# Patient Record
Sex: Female | Born: 1935
Health system: Southern US, Community
[De-identification: ages and names within clinical notes are randomized; demographics above are authoritative.]

## PROBLEM LIST (undated history)

## (undated) DIAGNOSIS — IMO0001 Reserved for inherently not codable concepts without codable children: Secondary | ICD-10-CM

## (undated) DIAGNOSIS — H3530 Unspecified macular degeneration: Secondary | ICD-10-CM

## (undated) DIAGNOSIS — E559 Vitamin D deficiency, unspecified: Principal | ICD-10-CM

## (undated) DIAGNOSIS — IMO0002 Reserved for concepts with insufficient information to code with codable children: Secondary | ICD-10-CM

## (undated) DIAGNOSIS — R32 Unspecified urinary incontinence: Secondary | ICD-10-CM

## (undated) DIAGNOSIS — I4891 Unspecified atrial fibrillation: Secondary | ICD-10-CM

## (undated) HISTORY — DX: Unspecified atrial fibrillation: I48.91

## (undated) HISTORY — DX: Vitamin D deficiency, unspecified: E55.9

## (undated) HISTORY — DX: Reserved for inherently not codable concepts without codable children: IMO0001

## (undated) HISTORY — PX: ARM SKIN LESION BIOPSY / EXCISION: SUR471

## (undated) HISTORY — DX: Reserved for concepts with insufficient information to code with codable children: IMO0002

## (undated) HISTORY — PX: OTHER SURGICAL HISTORY: SHX169

## (undated) HISTORY — DX: Unspecified urinary incontinence: R32

## (undated) HISTORY — DX: Unspecified macular degeneration: H35.30

## (undated) HISTORY — PX: LAMINECTOMY: SHX219

---
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
## 2005-06-10 HISTORY — PX: ABDOMINAL HYSTERECTOMY: SHX81

## 2005-06-10 LAB — CONVERTED CEMR LAB: Pap Smear: NORMAL

## 2009-01-27 DIAGNOSIS — IMO0001 Reserved for inherently not codable concepts without codable children: Secondary | ICD-10-CM

## 2009-01-27 HISTORY — DX: Reserved for inherently not codable concepts without codable children: IMO0001

## 2009-10-10 ENCOUNTER — Ambulatory Visit: Payer: Self-pay | Admitting: Family

## 2009-10-10 DIAGNOSIS — I4891 Unspecified atrial fibrillation: Secondary | ICD-10-CM

## 2009-10-10 DIAGNOSIS — I10 Essential (primary) hypertension: Secondary | ICD-10-CM

## 2009-10-10 DIAGNOSIS — Z8679 Personal history of other diseases of the circulatory system: Secondary | ICD-10-CM | POA: Insufficient documentation

## 2009-10-10 DIAGNOSIS — R32 Unspecified urinary incontinence: Secondary | ICD-10-CM | POA: Insufficient documentation

## 2009-10-10 LAB — CONVERTED CEMR LAB
ALT: <8 units/L (ref 0–35)
AST: 18 units/L (ref 0–37)
BUN: 15 mg/dL (ref 6–23)
Calcium: 10.3 mg/dL (ref 8.4–10.5)
Prothrombin Time: 21.3 s — ABNORMAL HIGH (ref 11.6–15.2)
Sodium: 142 meq/L (ref 135–145)

## 2009-10-11 ENCOUNTER — Telehealth: Payer: Self-pay | Admitting: Family

## 2009-10-11 ENCOUNTER — Encounter (INDEPENDENT_AMBULATORY_CARE_PROVIDER_SITE_OTHER): Payer: Self-pay | Admitting: *Deleted

## 2009-10-13 ENCOUNTER — Ambulatory Visit: Payer: Self-pay | Admitting: Internal Medicine

## 2009-10-13 LAB — CONVERTED CEMR LAB

## 2009-10-24 ENCOUNTER — Encounter: Payer: Self-pay | Admitting: Internal Medicine

## 2009-10-24 ENCOUNTER — Ambulatory Visit: Payer: Self-pay | Admitting: Internal Medicine

## 2009-10-25 ENCOUNTER — Ambulatory Visit: Payer: Self-pay | Admitting: Gastroenterology

## 2009-10-25 ENCOUNTER — Encounter: Payer: Self-pay | Admitting: Internal Medicine

## 2009-10-25 ENCOUNTER — Encounter (INDEPENDENT_AMBULATORY_CARE_PROVIDER_SITE_OTHER): Payer: Self-pay | Admitting: *Deleted

## 2009-10-25 ENCOUNTER — Ambulatory Visit: Payer: Self-pay | Admitting: Internal Medicine

## 2009-11-01 ENCOUNTER — Encounter: Payer: Self-pay | Admitting: Cardiology

## 2009-11-01 ENCOUNTER — Ambulatory Visit: Payer: Self-pay | Admitting: Cardiology

## 2009-11-07 ENCOUNTER — Telehealth: Payer: Self-pay | Admitting: Cardiology

## 2009-11-13 ENCOUNTER — Ambulatory Visit: Payer: Self-pay | Admitting: Family

## 2009-11-13 DIAGNOSIS — M810 Age-related osteoporosis without current pathological fracture: Secondary | ICD-10-CM

## 2009-11-13 LAB — CONVERTED CEMR LAB: Sodium, Ur: 45 meq/L

## 2009-11-14 ENCOUNTER — Ambulatory Visit: Payer: Self-pay | Admitting: Cardiology

## 2009-11-14 ENCOUNTER — Encounter: Payer: Self-pay | Admitting: Family

## 2009-11-14 LAB — CONVERTED CEMR LAB: POC INR: 2.2

## 2009-11-16 ENCOUNTER — Ambulatory Visit: Payer: Self-pay | Admitting: Diagnostic Radiology

## 2009-11-16 ENCOUNTER — Encounter: Payer: Self-pay | Admitting: Family

## 2009-11-16 ENCOUNTER — Ambulatory Visit (HOSPITAL_BASED_OUTPATIENT_CLINIC_OR_DEPARTMENT_OTHER): Admission: RE | Admit: 2009-11-16 | Discharge: 2009-11-16 | Payer: Self-pay | Admitting: Internal Medicine

## 2009-11-21 ENCOUNTER — Ambulatory Visit: Payer: Self-pay | Admitting: Family

## 2009-11-21 ENCOUNTER — Ambulatory Visit (HOSPITAL_BASED_OUTPATIENT_CLINIC_OR_DEPARTMENT_OTHER)
Admission: RE | Admit: 2009-11-21 | Discharge: 2009-11-21 | Payer: Self-pay | Source: Home / Self Care | Admitting: Internal Medicine

## 2009-11-21 ENCOUNTER — Ambulatory Visit: Payer: Self-pay | Admitting: Radiology

## 2009-11-22 ENCOUNTER — Telehealth: Payer: Self-pay | Admitting: Cardiology

## 2009-11-22 ENCOUNTER — Telehealth: Payer: Self-pay | Admitting: Family

## 2009-12-04 ENCOUNTER — Ambulatory Visit: Payer: Self-pay | Admitting: Gastroenterology

## 2009-12-08 ENCOUNTER — Encounter: Payer: Self-pay | Admitting: Gastroenterology

## 2009-12-12 ENCOUNTER — Ambulatory Visit: Payer: Self-pay | Admitting: Internal Medicine

## 2009-12-12 LAB — CONVERTED CEMR LAB: POC INR: 1.5

## 2009-12-13 ENCOUNTER — Telehealth: Payer: Self-pay | Admitting: Family

## 2009-12-19 ENCOUNTER — Ambulatory Visit: Payer: Self-pay | Admitting: Cardiovascular Disease

## 2009-12-19 LAB — CONVERTED CEMR LAB: POC INR: 2.6

## 2009-12-21 ENCOUNTER — Encounter: Payer: Self-pay | Admitting: Family

## 2010-01-16 ENCOUNTER — Ambulatory Visit: Payer: Self-pay | Admitting: Cardiology

## 2010-01-16 LAB — CONVERTED CEMR LAB: POC INR: 2.2

## 2010-01-31 ENCOUNTER — Encounter: Payer: Self-pay | Admitting: Cardiology

## 2010-01-31 ENCOUNTER — Ambulatory Visit: Payer: Self-pay | Admitting: Cardiology

## 2010-01-31 ENCOUNTER — Telehealth: Payer: Self-pay | Admitting: Cardiology

## 2010-01-31 LAB — CONVERTED CEMR LAB
Hemoglobin: 13.3 g/dL (ref 12.0–15.0)
MCHC: 32.4 g/dL (ref 30.0–36.0)
MCV: 94.3 fL (ref 78.0–100.0)
Platelets: 189 10*3/uL (ref 150–400)
RBC: 4.36 M/uL (ref 3.87–5.11)
RDW: 14.6 % (ref 11.5–15.5)

## 2010-02-13 ENCOUNTER — Ambulatory Visit: Payer: Self-pay | Admitting: Cardiovascular Disease

## 2010-02-19 ENCOUNTER — Ambulatory Visit: Payer: Self-pay | Admitting: Family

## 2010-02-19 DIAGNOSIS — D126 Benign neoplasm of colon, unspecified: Secondary | ICD-10-CM | POA: Insufficient documentation

## 2010-03-02 ENCOUNTER — Telehealth: Payer: Self-pay | Admitting: Family

## 2010-03-05 ENCOUNTER — Telehealth (INDEPENDENT_AMBULATORY_CARE_PROVIDER_SITE_OTHER): Payer: Self-pay | Admitting: *Deleted

## 2010-03-12 ENCOUNTER — Telehealth: Payer: Self-pay | Admitting: Cardiology

## 2010-03-13 ENCOUNTER — Ambulatory Visit: Payer: Self-pay | Admitting: Cardiology

## 2010-03-13 LAB — CONVERTED CEMR LAB: POC INR: 2

## 2010-04-10 ENCOUNTER — Ambulatory Visit: Payer: Self-pay | Admitting: Cardiovascular Disease

## 2010-04-11 ENCOUNTER — Telehealth: Payer: Self-pay | Admitting: Cardiology

## 2010-04-25 ENCOUNTER — Encounter: Payer: Self-pay | Admitting: Gastroenterology

## 2010-05-01 ENCOUNTER — Encounter: Payer: Self-pay | Admitting: Gastroenterology

## 2010-05-08 ENCOUNTER — Ambulatory Visit: Payer: Self-pay | Admitting: Cardiology

## 2010-05-09 ENCOUNTER — Telehealth: Payer: Self-pay | Admitting: Family

## 2010-05-15 ENCOUNTER — Encounter (INDEPENDENT_AMBULATORY_CARE_PROVIDER_SITE_OTHER): Payer: Self-pay | Admitting: *Deleted

## 2010-05-15 ENCOUNTER — Ambulatory Visit: Payer: Self-pay | Admitting: Gastroenterology

## 2010-05-30 ENCOUNTER — Ambulatory Visit: Payer: Self-pay | Admitting: Gastroenterology

## 2010-06-01 ENCOUNTER — Encounter: Payer: Self-pay | Admitting: Gastroenterology

## 2010-06-05 ENCOUNTER — Ambulatory Visit: Payer: Self-pay

## 2010-06-26 ENCOUNTER — Encounter: Payer: Self-pay | Admitting: Cardiology

## 2010-06-26 ENCOUNTER — Ambulatory Visit
Admission: RE | Admit: 2010-06-26 | Discharge: 2010-06-26 | Payer: Self-pay | Source: Home / Self Care | Attending: Family | Admitting: Family

## 2010-06-26 ENCOUNTER — Encounter: Payer: Self-pay | Admitting: Family

## 2010-06-26 DIAGNOSIS — R1031 Right lower quadrant pain: Secondary | ICD-10-CM | POA: Insufficient documentation

## 2010-06-26 LAB — CONVERTED CEMR LAB: Prothrombin Time: 25 s

## 2010-06-27 ENCOUNTER — Encounter: Payer: Self-pay | Admitting: Cardiology

## 2010-06-27 LAB — CONVERTED CEMR LAB
Basophils Absolute: 0 10*3/uL (ref 0.0–0.1)
Calcium: 9.8 mg/dL (ref 8.4–10.5)
Chloride: 105 meq/L (ref 96–112)
Creatinine, Ser: 0.63 mg/dL (ref 0.40–1.20)
HCT: 41.9 % (ref 36.0–46.0)
Lymphs Abs: 2.5 10*3/uL (ref 0.7–4.0)
MCHC: 32.9 g/dL (ref 30.0–36.0)
Monocytes Relative: 12 % (ref 3–12)
Neutro Abs: 3 10*3/uL (ref 1.7–7.7)
Platelets: 208 10*3/uL (ref 150–400)
Prothrombin Time: 25 s — ABNORMAL HIGH (ref 11.6–15.2)

## 2010-06-28 ENCOUNTER — Ambulatory Visit (HOSPITAL_BASED_OUTPATIENT_CLINIC_OR_DEPARTMENT_OTHER)
Admission: RE | Admit: 2010-06-28 | Discharge: 2010-06-28 | Payer: Self-pay | Source: Home / Self Care | Attending: Internal Medicine | Admitting: Internal Medicine

## 2010-06-29 ENCOUNTER — Telehealth: Payer: Self-pay | Admitting: Family

## 2010-07-10 NOTE — Assessment & Plan Note (Signed)
Summary: HEAD COLD/DT--Rm 5   Vital Signs:  Patient profile:   77 year old female Height:      66.5 inches Weight:      172 pounds BMI:     27.44 Temp:     97.6 degrees F oral Pulse rate:   66 / minute Pulse rhythm:   irregular Resp:     16 per minute BP sitting:   120 / 82  (right arm) Cuff size:   regular  Vitals Entered By: Mervin Kung CMA (November 21, 2009 11:14 AM) CC: Room 5     Head congestion and just started coughing. Is Patient Diabetic? No Comments Pt  is taking an herbal bone supplement instead of Calcium. Also taking Equate daytime cold 1 once daily and claritin 1 once daily    Primary Care Provider:  Sandford Craze, NP  CC:  Room 5     Head congestion and just started coughing.Marland Kitchen  History of Present Illness: Carla Curtis is a 77 year old female who presnets today with ocmplaint of sinus congestion.  She notes that she is having difficulty clearing nasal congestion, but what she has been able to blow out is light yellow in color.  Notes + HA over and behind her eyes.  Notes + dry cough which is mild.  Denies fever.  + malaise, poor energy.  Has tried dayquil with minimal improvement.    Allergies (verified): No Known Drug Allergies  Physical Exam  General:  Well-developed,well-nourished, tired appearing elderly white female, appropriate and cooperative throughout examination Head:  mild maxillary sinus tenderness to palpation Eyes:  No corneal or conjunctival inflammation noted. EOMI. Perrla. Funduscopic exam benign, without hemorrhages, exudates or papilledema. Vision grossly normal. Ears:  External ear exam shows no significant lesions or deformities.  Otoscopic examination reveals clear canals, tympanic membranes are intact bilaterally without bulging, retraction, inflammation or discharge. Hearing is grossly normal bilaterally. Mouth:  Oral mucosa and oropharynx without lesions or exudates.  Teeth in good repair. Lungs:  Normal respiratory effort, chest  expands symmetrically. Lungs are clear to auscultation, no crackles or wheezes. Heart:  Normal rate and regular rhythm. S1 and S2 normal without gallop, murmur, click, rub or other extra sounds.   Impression & Recommendations:  Problem # 1:  SINUSITIS (ICD-473.9) Assessment New  CXR was performed today and is negative for PNA, will plan to treat with amoxicillin 500mg  by mouth three times a day x 10 days.  Patient instructed on red flags that should prompt return per instructions. Message sent to Weston Brass PharmD with coumadin clinic notifing her that patient was started on antibiotics and that she will need earlier follow up. Her updated medication list for this problem includes:    Amoxicillin 500 Mg Cap (Amoxicillin) .Marland Kitchen... Take 1 capsule by mouth three times a day x 10 days  Orders: Prescription Created Electronically [phone])  Complete Medication List: 1)  Oxybutynin Chloride 10 Mg Xr24h-tab (Oxybutynin chloride) .... Take 1 tablet by mouth once a day as needed. 2)  Lasix 40 Mg Tabs (Furosemide) .... 1/2 tablet daily 3)  Dilt-xr 120 Mg Xr24h-cap (Diltiazem hcl) .... Take 1 capsule by mouth once a day 4)  Warfarin Sodium 5 Mg Tabs (Warfarin sodium) .... Take 1 tablet one day and alternate 1/2 tablet the next. 5)  Caltrate 600+d 600-400 Mg-unit Tabs (Calcium carbonate-vitamin d) .... One tab by mouth twice daily 6)  Fosamax 70 Mg Tabs (Alendronate sodium) .... One tablet by mouth once weekly.  take  with water 30 minutes before first food/med.  sit upright for 30 minutes after dose 7)  Amoxicillin 500 Mg Cap (Amoxicillin) .... Take 1 capsule by mouth three times a day x 10 days  Patient Instructions: 1)  Please complete your chest x-ray downstairs. 2)  Call if symptoms worsen or do not improve. 3)  We will call you wth the results of your x-ray. 4)  Please follow up in the coumadin Clinic this Friday. Prescriptions: AMOXICILLIN 500 MG CAP (AMOXICILLIN) Take 1 capsule by mouth three  times a day X 10 days  #30 x 0   Entered and Authorized by:   Lemont Fillers FNP   Signed by:   Lemont Fillers FNP on 11/21/2009   Method used:   Telephoned to ...       [facility]  [address] # [phone]* (retail)       [phone]       [place], [place]  [address]       Ph: [phone]       Fax: [phone]   RxID:   [id number]   Current Allergies (reviewed today): No known allergies

## 2010-07-10 NOTE — Miscellaneous (Signed)
Summary: BONE DENSITY  Clinical Lists Changes  Orders: Added new Test order of T-Bone Densitometry (77080) - Signed Added new Test order of T-Lumbar Vertebral Assessment (77082) - Signed 

## 2010-07-10 NOTE — Letter (Signed)
Summary: [facility] Instructions  [facility]  [address] [place], [place] [address]   Phone: [phone]  Fax: [phone]       CORONA Curtis    75-10-1932    MRN: [id number]        Procedure Day /Date:05/30/10  WED     Arrival Time:8 am     Procedure Time:9 am     Location of Procedure:                    X  [facility] (4th Floor)                        PREPARATION FOR COLONOSCOPY WITH MOVIPREP   Starting 5 days prior to your procedure 05/25/10 do not eat nuts, seeds, popcorn, corn, beans, peas,  salads, or any raw vegetables.  Do not take any fiber supplements (e.g. Metamucil, Citrucel, and Benefiber).  THE DAY BEFORE YOUR PROCEDURE         DATE: 05/29/10  DAY: TUE  1.  Drink clear liquids the entire day-NO SOLID FOOD  2.  Do not drink anything colored red or purple.  Avoid juices with pulp.  No orange juice.  3.  Drink at least 64 oz. (8 glasses) of fluid/clear liquids during the day to prevent dehydration and help the prep work efficiently.  CLEAR LIQUIDS INCLUDE: Water Jello Ice Popsicles Tea (sugar ok, no milk/cream) Powdered fruit flavored drinks Coffee (sugar ok, no milk/cream) Gatorade Juice: apple, white grape, white cranberry  Lemonade Clear bullion, consomm, broth Carbonated beverages (any kind) Strained chicken noodle soup Hard Candy                             4.  In the morning, mix first dose of MoviPrep solution:    Empty 1 Pouch A and 1 Pouch B into the disposable container    Add lukewarm drinking water to the top line of the container. Mix to dissolve    Refrigerate (mixed solution should be used within 24 hrs)  5.  Begin drinking the prep at 5:00 p.m. The MoviPrep container is divided by 4 marks.   Every 15 minutes drink the solution down to the next mark (approximately 8 oz) until the full liter is complete.   6.  Follow completed prep with 16 oz of clear liquid of your choice (Nothing red or purple).   Continue to drink clear liquids until bedtime.  7.  Before going to bed, mix second dose of MoviPrep solution:    Empty 1 Pouch A and 1 Pouch B into the disposable container    Add lukewarm drinking water to the top line of the container. Mix to dissolve    Refrigerate  THE DAY OF YOUR PROCEDURE      DATE: 05/30/10 DAY: WED  Beginning at 4 a.m. (5 hours before procedure):         1. Every 15 minutes, drink the solution down to the next mark (approx 8 oz) until the full liter is complete.  2. Follow completed prep with 16 oz. of clear liquid of your choice.    3. You may drink clear liquids until 7 am (2 HOURS BEFORE PROCEDURE).   MEDICATION INSTRUCTIONS  Unless otherwise instructed, you should take regular prescription medications with a small sip of water   as early as possible the morning of your procedure.  Stay on Coumadin per Dr Christella Hartigan           OTHER INSTRUCTIONS  You will need a responsible adult at least 75 years of age to accompany you and drive you home.   This person must remain in the waiting room during your procedure.  Wear loose fitting clothing that is easily removed.  Leave jewelry and other valuables at home.  However, you may wish to bring a book to read or  an iPod/MP3 player to listen to music as you wait for your procedure to start.  Remove all body piercing jewelry and leave at home.  Total time from sign-in until discharge is approximately 2-3 hours.  You should go home directly after your procedure and rest.  You can resume normal activities the  day after your procedure.  The day of your procedure you should not:   Drive   Make legal decisions   Operate machinery   Drink alcohol   Return to work  You will receive specific instructions about eating, activities and medications before you leave.    The above instructions have been reviewed and explained to me by   _______________________    I fully understand and can  verbalize these instructions _____________________________ Date _________  Appended Document: Moviprep Instructions pt instructions corrected to say Stop coumadin 5 days prior pt is aware and verbalized understanding

## 2010-07-10 NOTE — Medication Information (Signed)
Summary: rov/tm  Anticoagulant Therapy  Managed by: Bethena Midget, RN, BSN Referring MD: Jens Som PCP: Sandford Craze, NP Supervising MD: Graciela Husbands MD, Viviann Spare Indication 1: Atrial Fibrillation Lab Used: [facility] [place] Site: [address] INR POC 1.5 INR RANGE 2.0-3.0  Dietary changes: no    Health status changes: no    Bleeding/hemorrhagic complications: no    Recent/future hospitalizations: no    Any changes in medication regimen? yes       Details: added Fosamax and Calcium supplement  Recent/future dental: no  Any missed doses?: no       Is patient compliant with meds? yes       Allergies: No Known Drug Allergies  Anticoagulation Management History:      The patient is taking warfarin and comes in today for a routine follow up visit.  Positive risk factors for bleeding include an age of 76 years or older.  The bleeding index is 'intermediate risk'.  Positive CHADS2 values include History of HTN and Age > 35 years old.  Her last INR was 1.86.  Anticoagulation responsible provider: Graciela Husbands MD, Viviann Spare.  INR POC: 1.5.  Cuvette Lot#: [id number].  Exp: 02/2011.    Anticoagulation Management Assessment/Plan:      The patient's current anticoagulation dose is Warfarin sodium 5 mg tabs: Take 1 tablet one day and alternate 1/2 tablet the next..  The target INR is 2.0-3.0.  The next INR is due 12/19/2009.  Anticoagulation instructions were given to patient.  Results were reviewed/authorized by Bethena Midget, RN, BSN.  She was notified by Bethena Midget, RN, BSN.         Prior Anticoagulation Instructions: INR 2.2 Continue 5mg s daily except 2.5mg s on Mondays, Wednesdays and Fridays. Take last dose on 11/28/09 for procedure. Restart coumadin post procedure per Dr. Christella Hartigan instructions.   Current Anticoagulation Instructions: INR 1.5 Today take extra 1/2 pill then resume 1 pill everyday except 1/2 pill on Mondays, Wednesdays and Fridays. Recheck in one week.

## 2010-07-10 NOTE — Progress Notes (Signed)
Summary: DEXA SCAN   Phone Note Other Incoming   Caller: [facility]  Summary of Call: THE LAB CALLED TO SAY THEY HAD GOTTEN A FAXED ORDER FOR A DEXA SCAN ON THIS PATIENT.  THEY FORWARDED TO [facility] AND THEY CALLED BECAUSE THEY DO NOT DO DEXA HERE.  THE PATIENT NEEDS TO MAKE AN APPT WITH  [facility] ON [place].  DO YOU WANT ME TO CALL HER AND SET UP THE APPT  Initial call taken by: Roselle Locus,  Oct 11, 2009 9:10 AM  Follow-up for Phone Call        Yes please arrange at [facility] and notify patient. Follow-up by: Lemont Fillers FNP,  Oct 11, 2009 9:51 AM  Additional Follow-up for Phone Call Additional follow up Details #1::        Thanks, will do Roselle Locus  Oct 11, 2009 10:39 AM

## 2010-07-10 NOTE — Letter (Signed)
Summary: Results Letter  [facility]  [address] [place], [place] [address]   Phone: [phone]  Fax: [phone]        December 08, 2009 MRN: [id number]    KEMIA WENDEL [address] [place], [place]  [address]    Dear Ms. Dara Lords,   The polyp(s) removed during your recent procedure were proven to be adenomatous.  These are pre-cancerous polyps that may have grown into cancers if they had not been removed.  I think we should repeat this colonoscopy in about 6 months given the suboptimal visualization during the test.  We will therefore put your information in our reminder system and will contact you in 6 months to schedule a repeat procedure.  Please call if you have any questions or concerns.       Sincerely,  Rachael Fee MD  This letter has been electronically signed by your physician.  Appended Document: Results Letter letter mailed 7.6.11

## 2010-07-10 NOTE — Assessment & Plan Note (Signed)
Summary: [facility]   Visit Type:  3 months follow up Primary Provider:  Sandford Craze, NP  CC:  No cardiac complains.  History of Present Illness: 77 year old female I saw in May of 2011 for evaluation of chest pain and atrial fibrillation. Patient from [place]. I do not have her prior cardiac records available. Pt was apparently having an echocardiogram earlier this year and noted to be in atrial fibrillation. She has been on Cardizem and Coumadin since then. Also with chronic chest pain; had stress test in [facility] but results not available. Since I saw her in May of 2011. Since then, the patient has dyspnea with more extreme activities but not with routine activities. It is relieved with rest. It is not associated with chest pain. There is no orthopnea, PND or pedal edema. There is no syncope or palpitations. There is no exertional chest pain. She does occasionally have throat pain that she has had intermittently for years. It is not related to exertion. It resolved after drinking something cold.   Current Medications (verified): 1)  Oxybutynin Chloride 10 Mg Xr24h-Tab (Oxybutynin Chloride) .... Take 1 Tablet By Mouth Once A Day As Needed. 2)  Lasix 40 Mg Tabs (Furosemide) .... 1/2 Tablet Daily As Needed 3)  Dilt-Xr 120 Mg Xr24h-Cap (Diltiazem Hcl) .... Take 1 Capsule By Mouth Once A Day 4)  Warfarin Sodium 5 Mg Tabs (Warfarin Sodium) .... Take 1 Tablet One Day and Alternate 1/2 Tablet The Next. 5)  Fosamax 70 Mg Tabs (Alendronate Sodium) .... One Tablet By Mouth Once Weekly.  Take With Water 30 Minutes Before First Food/med.  Sit Upright For 30 Minutes After Dose  Allergies (verified): No Known Drug Allergies  Past History:  Past Medical History: Reviewed history from 11/01/2009 and no changes required. Atrial fibrillation  (on coumadin since mid 2010) HTN Incontinence  Past Surgical History: Reviewed history from 10/25/2009 and no changes required. Laminectomy  L4, L5-- 1989 & 1990 Bilateral Mammoplasty T&A--1941 Hysterectomy--2007    Social History: Reviewed history from 10/25/2009 and no changes required. Widow/Widower Never Smoked Alcohol use-rare alcohol, 2 drinks/week Regular exercise-no  2 daughters  Review of Systems       no fevers or chills, productive cough, hemoptysis, dysphasia, odynophagia, melena, hematochezia, dysuria, hematuria, rash, seizure activity, orthopnea, PND, pedal edema, claudication. Remaining systems are negative.   Vital Signs:  Patient profile:   77 year old female Height:      66.5 inches Weight:      173.75 pounds BMI:     27.72 Pulse rate:   64 / minute Pulse rhythm:   regular Resp:     18 per minute BP sitting:   120 / 70  (left arm) Cuff size:   large  Vitals Entered By: Vikki Ports (January 31, 2010 9:21 AM)  Physical Exam  General:  Well-developed well-nourished in no acute distress.  Skin is warm and dry.  HEENT is normal.  Neck is supple. No thyromegaly.  Chest is clear to auscultation with normal expansion.  Cardiovascular exam is regular rate and rhythm.  Abdominal exam nontender or distended. No masses palpated. Extremities show no edema. neuro grossly intact    EKG  Procedure date:  01/31/2010  Findings:      Normal sinus rhythm at a rate of 75. Axis normal. No ST changes.  Impression & Recommendations:  Problem # 1:  COUMADIN THERAPY (ICD-V58.61) Goal INR 2-3. Monitored in the Coumadin clinic. Check CBC. Orders: T-CBC No Diff ([id number])  Problem #  2:  HYPERTENSION (ICD-401.9) Blood pressure controlled on present medications. Will continue. Her updated medication list for this problem includes:    Lasix 40 Mg Tabs (Furosemide) .Marland Kitchen... 1/2 tablet daily as needed    Dilt-xr 120 Mg Xr24h-cap (Diltiazem hcl) .Marland Kitchen... Take 1 capsule by mouth once a day  Problem # 3:  FIBRILLATION, ATRIAL (ICD-427.31) Patient remains in sinus rhythm. Continue Cardizem. Continue Coumadin  with goal INR 2-3. We will again try to obtain records from [facility] concerning previous echocardiogram and history of atrial fibrillation. Her updated medication list for this problem includes:    Warfarin Sodium 5 Mg Tabs (Warfarin sodium) .Marland Kitchen... Take 1 tablet one day and alternate 1/2 tablet the next.  Problem # 4:  CHEST PAIN, HX OF (ICD-V12.50) Symptoms atypical. Obtain records from [place] concerning previous stress test.  Patient Instructions: 1)  Your physician recommends that you schedule a follow-up appointment in: ONE YEAR

## 2010-07-10 NOTE — Letter (Signed)
Summary: Anticoagulation Modification Letter   [facility]  [address] [place], [place] [address]   Phone: [phone]  Fax: [phone]    Oct 25, 2009  Re:    Carla Curtis DOB:    1932/10/24 MRN:    [id number]    Dear Dr Jens Som,  We have scheduled the above patient for an endoscopic procedure. Our records show that  he/she is on anticoagulation therapy. Please advise as to how long the patient may come off their therapy of Coumadin prior to the scheduled procedure(s) on 12/04/09.   Please fax back/or route the completed form to Patty at [phone].  Thank you for your help with this matter.  Sincerely,  Chales Abrahams CMA (AAMA)   Physician Recommendation:  Hold Plavix 7 days prior ________________  Hold Coumadin 5 days prior ____________  Other ______________________________     Appended Document: Anticoagulation Modification Letter ok to hold coumadin 4 days prior to procedure and resume after  Appended Document: Anticoagulation Modification Letter pt aware

## 2010-07-10 NOTE — Medication Information (Signed)
Summary: rov/jk  Anticoagulant Therapy  Managed by: Weston Brass, PharmD Referring MD: Jens Som PCP: Sandford Craze, NP Supervising MD: Eden Emms MD,Burkley Dech Indication 1: Atrial Fibrillation Lab Used: [facility] [place] Site: [address] INR POC 2.7 INR RANGE 2.0-3.0  Dietary changes: no    Health status changes: no    Bleeding/hemorrhagic complications: no    Recent/future hospitalizations: no    Any changes in medication regimen? no    Recent/future dental: no  Any missed doses?: no       Is patient compliant with meds? yes       Allergies: No Known Drug Allergies  Anticoagulation Management History:      The patient is taking warfarin and comes in today for a routine follow up visit.  Positive risk factors for bleeding include an age of 75 years or older.  The bleeding index is 'intermediate risk'.  Positive CHADS2 values include History of HTN and Age > 75 years old.  Her last INR was 1.86.  Anticoagulation responsible provider: Eden Emms MD,Georgia Delsignore.  INR POC: 2.7.  Cuvette Lot#: [id number].  Exp: 03/2011.    Anticoagulation Management Assessment/Plan:      The patient's current anticoagulation dose is Warfarin sodium 5 mg tabs: Take 1 tablet one day and alternate 1/2 tablet the next..  The target INR is 2.0-3.0.  The next INR is due 03/13/2010.  Anticoagulation instructions were given to patient.  Results were reviewed/authorized by Weston Brass, PharmD.  She was notified by Cloyde Reams RN.         Prior Anticoagulation Instructions: INR 2.2  Continue taking 1 tablet (5mg ) every day except take 1/2 tablet (2.5mg ) on Mon, Wed, and Fri.  Recheck in 4 weeks.   Current Anticoagulation Instructions: INR 2.7  Continue current dosage regimen of 1 tablet on sunday, tuesday, thursday, and saturday. And 1/2 tablet on monday, wednesday, and friday. See you in 4 weeks.

## 2010-07-10 NOTE — Letter (Signed)
Summary: [facility] Instructions  [facility]  [address] [place], [place] [address]   Phone: [phone]  Fax: [phone]       Carla Curtis    [birth year]    MRN: [id number]        Procedure Day /Date:12/04/09  MON     Arrival Time:1230 pm     Procedure Time:130 pm     Location of Procedure:                    X  [facility] (4th Floor)                        PREPARATION FOR COLONOSCOPY WITH MOVIPREP   Starting 5 days prior to your procedure 11/29/09 do not eat nuts, seeds, popcorn, corn, beans, peas,  salads, or any raw vegetables.  Do not take any fiber supplements (e.g. Metamucil, Citrucel, and Benefiber).  THE DAY BEFORE YOUR PROCEDURE         DATE: 12/03/09  DAY: SUN  1.  Drink clear liquids the entire day-NO SOLID FOOD  2.  Do not drink anything colored red or purple.  Avoid juices with pulp.  No orange juice.  3.  Drink at least 64 oz. (8 glasses) of fluid/clear liquids during the day to prevent dehydration and help the prep work efficiently.  CLEAR LIQUIDS INCLUDE: Water Jello Ice Popsicles Tea (sugar ok, no milk/cream) Powdered fruit flavored drinks Coffee (sugar ok, no milk/cream) Gatorade Juice: apple, white grape, white cranberry  Lemonade Clear bullion, consomm, broth Carbonated beverages (any kind) Strained chicken noodle soup Hard Candy                             4.  In the morning, mix first dose of MoviPrep solution:    Empty 1 Pouch A and 1 Pouch B into the disposable container    Add lukewarm drinking water to the top line of the container. Mix to dissolve    Refrigerate (mixed solution should be used within 24 hrs)  5.  Begin drinking the prep at 5:00 p.m. The MoviPrep container is divided by 4 marks.   Every 15 minutes drink the solution down to the next mark (approximately 8 oz) until the full liter is complete.   6.  Follow completed prep with 16 oz of clear liquid of your choice (Nothing red or purple).   Continue to drink clear liquids until bedtime.  7.  Before going to bed, mix second dose of MoviPrep solution:    Empty 1 Pouch A and 1 Pouch B into the disposable container    Add lukewarm drinking water to the top line of the container. Mix to dissolve    Refrigerate  THE DAY OF YOUR PROCEDURE      DATE: 12/04/09 DAY: MON  Beginning at 830 a.m. (5 hours before procedure):         1. Every 15 minutes, drink the solution down to the next mark (approx 8 oz) until the full liter is complete.  2. Follow completed prep with 16 oz. of clear liquid of your choice.    3. You may drink clear liquids until 1130 am (2 HOURS BEFORE PROCEDURE).   MEDICATION INSTRUCTIONS  Unless otherwise instructed, you should take regular prescription medications with a small sip of water   as early as possible the morning of your procedure.  Stop taking Coumadin on  11/29/09  (5 days before procedure).   You will be contaced by our office prior to your procedure for directions on holding your Coumadin/Warfarin.  If you do not hear from our office 1 week prior to your scheduled procedure, please call [phone] to discuss.           OTHER INSTRUCTIONS  You will need a responsible adult at least 75 years of age to accompany you and drive you home.   This person must remain in the waiting room during your procedure.  Wear loose fitting clothing that is easily removed.  Leave jewelry and other valuables at home.  However, you may wish to bring a book to read or  an iPod/MP3 player to listen to music as you wait for your procedure to start.  Remove all body piercing jewelry and leave at home.  Total time from sign-in until discharge is approximately 2-3 hours.  You should go home directly after your procedure and rest.  You can resume normal activities the  day after your procedure.  The day of your procedure you should not:   Drive   Make legal decisions   Operate machinery    Drink alcohol   Return to work  You will receive specific instructions about eating, activities and medications before you leave.    The above instructions have been reviewed and explained to me by   _______________________    I fully understand and can verbalize these instructions _____________________________ Date _________

## 2010-07-10 NOTE — Miscellaneous (Signed)
Summary: mammogram  Clinical Lists Changes  Observations: Added new observation of MAMMOGRAM: normal (11/16/2009 16:10)      Preventive Care Screening  Mammogram:    Date:  11/16/2009    Results:  normal

## 2010-07-10 NOTE — Procedures (Signed)
Summary: Recall / [facility]  Recall / [facility]   Imported By: Lennie Odor 05/07/2010 13:10:21  _____________________________________________________________________  External Attachment:    Type:   Image     Comment:   External Document

## 2010-07-10 NOTE — Progress Notes (Signed)
Summary: refill/please call pt once sent  Phone Note Refill Request Call back at [phone] Message from:  Patient on Nov 07, 2009 10:27 AM  Refills Requested: Medication #1:  DILT-XR 120 MG XR24H-CAP Take 1 capsule by mouth once a day   Supply Requested: 3 months [facility] [address]   Method Requested: Fax to Local Pharmacy Initial call taken by: Migdalia Dk,  Nov 07, 2009 10:27 AM    Prescriptions: DILT-XR 120 MG XR24H-CAP (DILTIAZEM HCL) Take 1 capsule by mouth once a day  #90 x 3   Entered by:   Kem Parkinson   Authorized by:   Ferman Hamming, MD, [facility]   Signed by:   Kem Parkinson on 11/08/2009   Method used:   Electronically to        [facility] # [phone]* (retail)       [address]       [place], [place]  [address]       Ph: [phone]       Fax: [phone]   RxID:   [id number]

## 2010-07-10 NOTE — Progress Notes (Signed)
Summary: [facility] needs new code  Phone Note From Other Clinic   Caller: Provider Summary of Call: Kelvin Cellar at [facility] 733.90 code does not support diagnosis for [facility] [phone] Initial call taken by: Lannette Donath,  December 13, 2009 2:15 PM  Follow-up for Phone Call        call returned to Mya at [facility]. She was provided dx code 401.9, which was acceptable code for TSH lab Follow-up by: Glendell Docker CMA,  December 14, 2009 9:08 AM

## 2010-07-10 NOTE — Procedures (Signed)
Summary: Colonoscopy  Patient: Carla Curtis Note: All result statuses are Final unless otherwise noted.  Tests: (1) Colonoscopy (COL)   COL Colonoscopy           DONE     [facility]     [address]     [place], [place]  [address]           COLONOSCOPY PROCEDURE REPORT           PATIENT:  Carla, Curtis  MR#:  [id number]     BIRTHDATE:  1932-08-23, 77 yrs. old  GENDER:  female     ENDOSCOPIST:  Rachael Fee, MD     REF. BY:  Sandford Craze, FNP     PROCEDURE DATE:  12/04/2009     PROCEDURE:  Colonoscopy with snare polypectomy     ASA CLASS:  Class II     INDICATIONS:  Routine Risk Screening     MEDICATIONS:   Fentanyl 50 mcg IV, Versed 4 mg IV           DESCRIPTION OF PROCEDURE:   After the risks benefits and     alternatives of the procedure were thoroughly explained, informed     consent was obtained.  Digital rectal exam was performed and     revealed no rectal masses.   The LB [id number]-[id number] endoscope     was introduced through the anus and advanced to the cecum, which     was identified by both the appendix and ileocecal valve, without     limitations.  The quality of the prep was poor, using MoviPrep.     The instrument was then slowly withdrawn as the colon was fully     examined.     <<PROCEDUREIMAGES>>           FINDINGS:  A sessile polyp was found in the descending colon. This     was 6mm across, removed with cold snare and sent to pathology (jar     1) (see image5).  Mild diverticulosis was found in the sigmoid to     descending colon segments (see image1).  Poor prep limited this     examination (see image2, image7, and image6).  This was otherwise     a normal examination of the colon (see image8, image4, and     image3).   Retroflexed views in the rectum revealed no     abnormalities.    The scope was then withdrawn from the patient     and the procedure completed.           COMPLICATIONS:  None     ENDOSCOPIC IMPRESSION:     1) Small  sessile polyp in the descending colon, removed and sent     to pathology     2) Mild diverticulosis in the sigmoid to descending colon     segments     3) Poor prep limited the examination (she drank all of the split     dose prep last night)     4) Otherwise normal examination           RECOMMENDATIONS:     Given relatively poor prep, small lesions could have been     missed.     Await final pathology but will likely recommend repeat     colonoscopy in 6-12 months (off coumadin again).     OK to resume coumadin tonight.  ______________________________     Rachael Fee, MD           n.     eSIGNED:   Rachael Fee at 12/04/2009 01:55 PM           Linden Dolin, [id number]  Note: An exclamation mark (!) indicates a result that was not dispersed into the flowsheet. Document Creation Date: 12/04/2009 1:55 PM _______________________________________________________________________  (1) Order result status: Final Collection or observation date-time: 12/04/2009 13:48 Requested date-time:  Receipt date-time:  Reported date-time:  Referring Physician:   Ordering Physician: Rob Bunting [phone]) Specimen Source:  Source: Launa Grill Order Number: [id number] Lab site:   Appended Document: Colonoscopy recall     Procedures Next Due Date:    Colonoscopy: 05/2010

## 2010-07-10 NOTE — Consult Note (Signed)
Summary: [facility] @ [facility]   Imported By: Lanelle Bal 11/01/2009 11:41:34  _____________________________________________________________________  External Attachment:    Type:   Image     Comment:   External Document

## 2010-07-10 NOTE — Medication Information (Signed)
Summary: new to coumadin/afib  Anticoagulant Therapy  Managed by: Eda Keys, PharmD Referring MD: Jens Som PCP: Georgian Co, FNP Supervising MD: Jens Som MD, Arlys John Indication 1: Atrial Fibrillation Lab Used: [facility] [place] Site: [address] INR POC 2.0 INR RANGE 2.0-3.0  Dietary changes: no    Health status changes: no    Bleeding/hemorrhagic complications: no    Recent/future hospitalizations: no    Any changes in medication regimen? yes       Details: OmegaRed Taking 1 capsule each day.  Recent/future dental: yes     Details: see note below  Any missed doses?: yes     Details: missed a couple doses during relocation from [place]  Is patient compliant with meds? yes      Comments: Patient is to have a colonoscopy done, pt will notify us when this is scheduled.  Patient has been educated regarding coumadin, adverse effects, etc.  Prior to coming to [place], patient was taking 5 mg tablets - 5 mg alternating with 2.5 mg.    Current Medications (verified): 1)  Oxybutynin Chloride 10 Mg Xr24h-Tab (Oxybutynin Chloride) .... Take 1 Tablet By Mouth Once A Day As Needed. 2)  Lasix 40 Mg Tabs (Furosemide) .... 1/2 Tablet Daily 3)  Dilt-Xr 120 Mg Xr24h-Cap (Diltiazem Hcl) .... Take 1 Capsule By Mouth Once A Day 4)  Warfarin Sodium 5 Mg Tabs (Warfarin Sodium) .... Take 1 Tablet One Day and Alternate 1/2 Tablet The Next.  Allergies (verified): No Known Drug Allergies  Anticoagulation Management History:      The patient comes in today for her initial visit for anticoagulation therapy.  Positive risk factors for bleeding include an age of 16 years or older.  The bleeding index is 'intermediate risk'.  Positive CHADS2 values include History of HTN and Age > 40 years old.  Her last INR was 1.86.  Anticoagulation responsible provider: Jens Som MD, Arlys John.  INR POC: 2.0.  Cuvette Lot#: [id number].  Exp: 11/2010.    Anticoagulation Management Assessment/Plan:  The patient's current anticoagulation dose is Warfarin sodium 5 mg tabs: Take 1 tablet one day and alternate 1/2 tablet the next..  The target INR is 2.0-3.0.  The next INR is due 10/24/2009.  Anticoagulation instructions were given to patient.  Results were reviewed/authorized by Eda Keys, PharmD.  She was notified by Eda Keys.         Current Anticoagulation Instructions: INR 2.0  Continue taking 1/2 tablet on Monday, Wednesday, and Friday and 1 tablet all other days.  Return to clinic in 10 days.

## 2010-07-10 NOTE — Medication Information (Signed)
Summary: rov/kb  Anticoagulant Therapy  Managed by: Bethena Midget, RN, BSN Referring MD: Jens Som PCP: Sandford Craze, NP Supervising MD: Daleen Squibb MD, Maisie Fus Indication 1: Atrial Fibrillation Lab Used: [facility] [place] Site: [address] INR POC 2.2 INR RANGE 2.0-3.0  Dietary changes: no    Health status changes: no    Bleeding/hemorrhagic complications: no    Recent/future hospitalizations: yes       Details: Pending Colonoscopy on 12/04/09 with Dr Christella Hartigan  Any changes in medication regimen? yes       Details: Took ABX for 12 days completed 4 days ago  Recent/future dental: no  Any missed doses?: no       Is patient compliant with meds? yes       Allergies: No Known Drug Allergies  Anticoagulation Management History:      The patient is taking warfarin and comes in today for a routine follow up visit.  Positive risk factors for bleeding include an age of 75 years or older.  The bleeding index is 'intermediate risk'.  Positive CHADS2 values include History of HTN and Age > 31 years old.  Her last INR was 1.86.  Anticoagulation responsible provider: Daleen Squibb MD, Maisie Fus.  INR POC: 2.2.  Cuvette Lot#: [id number].  Exp: 01/2011.    Anticoagulation Management Assessment/Plan:      The patient's current anticoagulation dose is Warfarin sodium 5 mg tabs: Take 1 tablet one day and alternate 1/2 tablet the next..  The target INR is 2.0-3.0.  The next INR is due 12/12/2009.  Anticoagulation instructions were given to patient.  Results were reviewed/authorized by Bethena Midget, RN, BSN.  She was notified by Bethena Midget, RN, BSN.         Prior Anticoagulation Instructions: INR-2.2 Resume normal schedule.Take 1/2 a tablet on Monday, Wednesday and Friday and take 1 tablet on all other days of the week. Return in 3weeks.  Current Anticoagulation Instructions: INR 2.2 Continue 5mg s daily except 2.5mg s on Mondays, Wednesdays and Fridays. Take last dose on 11/28/09 for procedure.  Restart coumadin post procedure per Dr. Christella Hartigan instructions.

## 2010-07-10 NOTE — Progress Notes (Signed)
Summary: Calling to leave information to get records  Phone Note Call from Patient Call back at [phone]   Summary of Call: Pt request call regarding records  [facility] MRN [id number] call # [phone] Address: [address] [place] [address] [phone] Initial call taken by: Judie Grieve,  January 31, 2010 10:30 AM     Appended Document: Calling to leave information to get records Faxed ROi over to [facility] @ [phone]..Medical Records # [id number]

## 2010-07-10 NOTE — Medication Information (Signed)
Summary: rov/tm  Anticoagulant Therapy  Managed by: Weston Brass, PharmD Referring MD: Jens Som PCP: Sandford Craze, NP Supervising MD: Clifton James MD, Cristal Deer Indication 1: Atrial Fibrillation Lab Used: [facility] [place] Site: [address] INR POC 2.6 INR RANGE 2.0-3.0  Dietary changes: no    Health status changes: yes       Details: recently held coumadin for colonoscopy.  Had 1 polyp removed that was said to be pre-cancerous  Bleeding/hemorrhagic complications: no    Recent/future hospitalizations: no    Any changes in medication regimen? no    Recent/future dental: no  Any missed doses?: no       Is patient compliant with meds? yes       Allergies: No Known Drug Allergies  Anticoagulation Management History:      The patient is taking warfarin and comes in today for a routine follow up visit.  Positive risk factors for bleeding include an age of 75 years or older.  The bleeding index is 'intermediate risk'.  Positive CHADS2 values include History of HTN and Age > 75 years old.  Her last INR was 1.86.  Anticoagulation responsible provider: Clifton James MD, Cristal Deer.  INR POC: 2.6.  Cuvette Lot#: [id number].  Exp: 02/2011.    Anticoagulation Management Assessment/Plan:      The patient's current anticoagulation dose is Warfarin sodium 5 mg tabs: Take 1 tablet one day and alternate 1/2 tablet the next..  The target INR is 2.0-3.0.  The next INR is due 01/16/2010.  Anticoagulation instructions were given to patient.  Results were reviewed/authorized by Weston Brass, PharmD.  She was notified by Weston Brass PharmD.         Prior Anticoagulation Instructions: INR 1.5 Today take extra 1/2 pill then resume 1 pill everyday except 1/2 pill on Mondays, Wednesdays and Fridays. Recheck in one week.   Current Anticoagulation Instructions: INR 2.6  Continue same dose fo 1 tablet every day except 1/2 tablet on Monday, Wednesday and Friday.

## 2010-07-10 NOTE — Letter (Signed)
Summary: Colonoscopy Letter  [facility]  [address] [place], [place] [address]   Phone: [phone]  Fax: [phone]      May 01, 2010 MRN: [id number]   Carla Curtis [address] [place], [place]  [address]   Dear Ms. Carla Curtis,   According to your medical record, it is time for you to schedule a Colonoscopy. The American Cancer Society recommends this procedure as a method to detect early colon cancer. Patients with a family history of colon cancer, or a personal history of colon polyps or inflammatory bowel disease are at increased risk.  This letter has been generated based on the recommendations made at the time of your procedure. If you feel that in your particular situation this may no longer apply, please contact our office.  Please call our office at [phone] to schedule this appointment or to update your records at your earliest convenience.  Thank you for cooperating with [place] to provide you with the very best care possible.   Sincerely,  Rachael Fee, M.D.  [facility] [phone]

## 2010-07-10 NOTE — Assessment & Plan Note (Signed)
History of Present Illness Visit Type: Initial Consult Primary GI MD: Rob Bunting MD Primary Provider: Sandford Craze, NP Requesting Provider: Sandford Craze, NP Chief Complaint: Colon screening History of Present Illness:     very pleasant 77 year old woman who is here with her daughter today.  she has never had a colonoscopy.  Has had some RLQ discomforts, occaisionally.  Not too bothersome.  No overt GI bleeding.  no significant constipation or diarrhea.  Moved from [place] 1-2 months ago to be closer to family.  No weight changes.  she has been on Coumadin for atrial fibrillation for the past year. She is scheduled to meet a cardiologist here in [place] next week.           Current Medications (verified): 1)  Oxybutynin Chloride 10 Mg Xr24h-Tab (Oxybutynin Chloride) .... Take 1 Tablet By Mouth Once A Day As Needed. 2)  Lasix 40 Mg Tabs (Furosemide) .... 1/2 Tablet Daily 3)  Dilt-Xr 120 Mg Xr24h-Cap (Diltiazem Hcl) .... Take 1 Capsule By Mouth Once A Day 4)  Warfarin Sodium 5 Mg Tabs (Warfarin Sodium) .... Take 1 Tablet One Day and Alternate 1/2 Tablet The Next.  Allergies (verified): No Known Drug Allergies  Past History:  Past Medical History: Afib (on coumadin since mid 2010) HTN  Past Surgical History: Laminectomy L4, L5-- 1989 & 1990 Bilateral Mammoplasty T&A--1941 Hysterectomy--2007    Family History: Ovarian  Cancer--daughter Breast Cancer-- daughter HTN-- mother  Daughter- ovarian cancer Mom- died at 41 HTN, died of old age Dad-died at 78, smoker COPD lung cancer Uncle- died from hodgkins disease in 3's sister- living, 7 years older, breast cancer, bell's palsy, CHF Daughter Gavin Pound- Stage 1 breast cancer s/p lumpectomy and radiation. no colon cancer  Social History: Widow/Widower Never Smoked Alcohol use-rare alcohol, 2 drinks/week Regular exercise-no  2 daughters  Review of Systems       Pertinent positive and  negative review of systems were noted in the above HPI and GI specific review of systems.  All other review of systems was otherwise negative.   Vital Signs:  Patient profile:   77 year old female Height:      66.5 inches Weight:      173.38 pounds BMI:     27.66 Pulse rate:   68 / minute Pulse rhythm:   regular BP sitting:   100 / 60  (left arm) Cuff size:   regular  Vitals Entered By: June McMurray CMA [place]) (Oct 25, 2009 1:26 PM)  Physical Exam  Additional Exam:  Constitutional: generally well appearing Psychiatric: alert and oriented times 3 Eyes: extraocular movements intact Mouth: oropharynx moist, no lesions Neck: supple, no lymphadenopathy Cardiovascular: heart regular rate and rythm Lungs: CTA bilaterally Abdomen: soft, non-tender, non-distended, no obvious ascites, no peritoneal signs, normal bowel sounds Extremities: no lower extremity edema bilaterally Skin: no lesions on visible extremities    Impression & Recommendations:  Problem # 1:  routine risk for colon cancer she is on Coumadin for atrial fibrillation and that would put her at increased risk for bleeding complications during any invasive procedure. We generally would like patients to hold Coumadin for 5 days prior to colonoscopy. She is scheduled to meet a cardiologist here in town next week, we will contact him shortly after that visit to see if he thinks it is safe for her to stop Coumadin. She has only been on the medicine for one year.  Patient Instructions: 1)  We will contact Dr. Jens Som (whom you have  not yet met) in 2 weeks to discuss holding coumadin for 5 days prior to colonoscopy. 2)  You will be scheduled to have a colonoscopy in about a month (to give [place] time to coordinate with Dr. Jens Som). 3)  The medication list was reviewed and reconciled.  All changed / newly prescribed medications were explained.  A complete medication list was provided to the patient / caregiver.  Appended Document:  Orders Update/movi    Clinical Lists Changes  Problems: Added new problem of SPECIAL SCREENING FOR MALIGNANT NEOPLASMS COLON (ICD-V76.51) Medications: Added new medication of MOVIPREP 100 GM  SOLR (PEG-KCL-NACL-NASULF-NA ASC-C) As per prep instructions. - Signed Rx of MOVIPREP 100 GM  SOLR (PEG-KCL-NACL-NASULF-NA ASC-C) As per prep instructions.;  #1 x 0;  Signed;  Entered by: Chales Abrahams CMA (AAMA);  Authorized by: Rachael Fee MD;  Method used: Electronically to [facility] # [phone]*, [address], [place], [place], [place]  [address], Ph: [phone], Fax: [phone] Orders: Added new Test order of Colonoscopy (Colon) - Signed    Prescriptions: MOVIPREP 100 GM  SOLR (PEG-KCL-NACL-NASULF-NA ASC-C) As per prep instructions.  #1 x 0   Entered by:   Chales Abrahams CMA (AAMA)   Authorized by:   Rachael Fee MD   Signed by:   Chales Abrahams CMA (AAMA) on 10/25/2009   Method used:   Electronically to        [facility] # [phone]* (retail)       [address]       [place], [place]  [address]       Ph: [phone]       Fax: [phone]   RxID:   [id number]

## 2010-07-10 NOTE — Progress Notes (Signed)
Summary: REFILL--Fosamax  Phone Note Refill Request Message from:  Fax from Pharmacy on May 09, 2010 9:30 AM  Refills Requested: Medication #1:  FOSAMAX 70 MG TABS one tablet by mouth once weekly.  Take with water 30 minutes before first food/med.  Sit upright for 30 minutes after dose   Dosage confirmed as above?Dosage Confirmed   Brand Name Necessary? No   Supply Requested: 1 month   Last Refilled: 04/23/2010 [facility] [address] [place]  PHONE [phone]   Method Requested: Electronic Next Appointment Scheduled: 06-05-10 COUMADIN  Initial call taken by: Roselle Locus,  May 09, 2010 9:33 AM    Prescriptions: FOSAMAX 70 MG TABS (ALENDRONATE SODIUM) one tablet by mouth once weekly.  Take with water 30 minutes before first food/med.  Sit upright for 30 minutes after dose  #4 Each x 4   Entered by:   Mervin Kung CMA (AAMA)   Authorized by:   Lemont Fillers FNP   Signed by:   Mervin Kung CMA (AAMA) on 05/09/2010   Method used:   Electronically to        [id number]* (retail)       [address]       [place], [place]  [address]       Ph: [phone]       Fax: [phone]   RxID:   [id number]

## 2010-07-10 NOTE — Medication Information (Signed)
Summary: rov/sp  Anticoagulant Therapy  Managed by: Weston Brass, PharmD Referring MD: Jens Som PCP: Sandford Craze, NP Supervising MD: Daleen Squibb MD, Maisie Fus Indication 1: Atrial Fibrillation Lab Used: [facility] [place] Site: [address] INR POC 2.2 INR RANGE 2.0-3.0  Dietary changes: no    Health status changes: no    Bleeding/hemorrhagic complications: no    Recent/future hospitalizations: no    Any changes in medication regimen? no    Recent/future dental: no  Any missed doses?: no       Is patient compliant with meds? yes       Allergies: No Known Drug Allergies  Anticoagulation Management History:      The patient is taking warfarin and comes in today for a routine follow up visit.  Positive risk factors for bleeding include an age of 75 years or older.  The bleeding index is 'intermediate risk'.  Positive CHADS2 values include History of HTN and Age > 38 years old.  Her last INR was 1.86.  Anticoagulation responsible provider: Daleen Squibb MD, Maisie Fus.  INR POC: 2.2.  Cuvette Lot#: [id number].  Exp: 03/2011.    Anticoagulation Management Assessment/Plan:      The patient's current anticoagulation dose is Warfarin sodium 5 mg tabs: Take 1 tablet one day and alternate 1/2 tablet the next..  The target INR is 2.0-3.0.  The next INR is due 02/13/2010.  Anticoagulation instructions were given to patient.  Results were reviewed/authorized by Weston Brass, PharmD.  She was notified by Gweneth Fritter, PharmD Candidate.         Prior Anticoagulation Instructions: INR 2.6  Continue same dose fo 1 tablet every day except 1/2 tablet on Monday, Wednesday and Friday.   Current Anticoagulation Instructions: INR 2.2  Continue taking 1 tablet (5mg ) every day except take 1/2 tablet (2.5mg ) on Mon, Wed, and Fri.  Recheck in 4 weeks.

## 2010-07-10 NOTE — Progress Notes (Signed)
Summary: refill--coumadin  Phone Note Refill Request Message from:  Patient on March 12, 2010 3:37 PM  Refills Requested: Medication #1:  WARFARIN SODIUM 5 MG TABS Take 1/2 tablet Mon. Wed   Dosage confirmed as above?Dosage Confirmed   Supply Requested: 1 month Pt would like refill sent to [facility] [address]   Initial call taken by: Mervin Kung CMA (AAMA),  March 12, 2010 3:40 PM    New/Updated Medications: WARFARIN SODIUM 5 MG TABS (WARFARIN SODIUM) Take 1/2 tablet Mon. Wed, Fri. and 1 tablet all other days. Prescriptions: WARFARIN SODIUM 5 MG TABS (WARFARIN SODIUM) Take 1/2 tablet Mon. Wed, Fri. and 1 tablet all other days.  #30 x 2   Entered by:   Cloyde Reams RN   Authorized by:   Ferman Hamming, MD, [facility]   Signed by:   Cloyde Reams RN on 03/13/2010   Method used:   Electronically to        [facility] [address] # [phone]* (retail)       [address]       [place], [place]  [address]       Ph: [phone]       Fax: [phone]   RxID:   [id number]

## 2010-07-10 NOTE — Progress Notes (Signed)
----   Converted from flag ---- ---- 11/14/2009 10:29 AM, Carla Midget, RN, BSN wrote: Pt pending colonoscopy on 12/04/09 with Dr Christella Hartigan. She is cleared to stop for the 5 days? ------------------------------       Additional Follow-up for Phone Call Additional follow up Details #2::    ok to dc coumadin 5 days prior to procedure and resume after procedure Ferman Hamming, MD, [facility]  November 22, 2009 11:14 AM

## 2010-07-10 NOTE — Medication Information (Signed)
Summary: rov/eac  Anticoagulant Therapy  Managed by: Weston Brass, PharmD Referring MD: Jens Som PCP: Georgian Co, FNP Supervising MD: Shirlee Latch MD, Chidera Dearcos Indication 1: Atrial Fibrillation Lab Used: [facility] [place] Site: [address] INR POC 2.2 INR RANGE 2.0-3.0  Dietary changes: no    Health status changes: no    Bleeding/hemorrhagic complications: no    Recent/future hospitalizations: no    Any changes in medication regimen? no    Recent/future dental: no  Any missed doses?: no       Is patient compliant with meds? yes      Comments: Patient due for appointment gastroenterologist 10/25/09 for possible colonoscopy. She will contact us with the scheduled date.  Allergies: No Known Drug Allergies  Anticoagulation Management History:      The patient is taking warfarin and comes in today for a routine follow up visit.  Positive risk factors for bleeding include an age of 75 years or older.  The bleeding index is 'intermediate risk'.  Positive CHADS2 values include History of HTN and Age > 20 years old.  Her last INR was 1.86.  Anticoagulation responsible provider: Shirlee Latch MD, Rakiya Krawczyk.  INR POC: 2.2.  Cuvette Lot#: [id number].  Exp: 01/2011.    Anticoagulation Management Assessment/Plan:      The patient's current anticoagulation dose is Warfarin sodium 5 mg tabs: Take 1 tablet one day and alternate 1/2 tablet the next..  The target INR is 2.0-3.0.  The next INR is due 11/14/2009.  Anticoagulation instructions were given to patient.  Results were reviewed/authorized by Weston Brass, PharmD.  She was notified by [facility].         Prior Anticoagulation Instructions: INR 2.0  Continue taking 1/2 tablet on Monday, Wednesday, and Friday and 1 tablet all other days.  Return to clinic in 10 days.    Current Anticoagulation Instructions: INR-2.2 Resume normal schedule.Take 1/2 a tablet on Monday, Wednesday and Friday and take 1 tablet on all other days of the  week. Return in 3weeks.

## 2010-07-10 NOTE — Assessment & Plan Note (Signed)
Summary:  Cardiology   Visit Type:  Initial Consult Primary Provider:  Sandford Craze, NP  CC:  Atrial fibrillation.  History of Present Illness: 77 year old female for evaluation of chest pain and atrial fibrillation. Patient from [place]. I do not have her prior cardiac records available. Pt was apparently having an echocardiogram earlier this year and noted to be in atrial fibrillation. She has been on Cardizem and Coumadin since then. She does have some dyspnea on exertion relieved with rest but this is a chronic issue. There is no orthopnea or PND but has occasional mild pedal edema improved with diuretics. She has had intermittent chest pain since the 1980s. It begins in her jaws and then radiates to her chest. It is described as a pressure. There is no associated symptoms. It is not pleuritic or positional. It is not exertional. It lasts 5-10 minutes and then resolves. It improves with drinking cold water. She has had a recent stress test but I do not have those records available. Because of the above we're asked to further evaluate.  Current Medications (verified): 1)  Oxybutynin Chloride 10 Mg Xr24h-Tab (Oxybutynin Chloride) .... Take 1 Tablet By Mouth Once A Day As Needed. 2)  Lasix 40 Mg Tabs (Furosemide) .... 1/2 Tablet Daily 3)  Dilt-Xr 120 Mg Xr24h-Cap (Diltiazem Hcl) .... Take 1 Capsule By Mouth Once A Day 4)  Warfarin Sodium 5 Mg Tabs (Warfarin Sodium) .... Take 1 Tablet One Day and Alternate 1/2 Tablet The Next. 5)  Nitrofurantoin Macrocrystal 100 Mg Caps (Nitrofurantoin Macrocrystal) .... Take One Capsule Every 12 Hours  Allergies (verified): No Known Drug Allergies  Past History:  Past Medical History: Atrial fibrillation  (on coumadin since mid 2010) HTN Incontinence  Past Surgical History: Reviewed history from 10/25/2009 and no changes required. Laminectomy L4, L5-- 1989 & 1990 Bilateral Mammoplasty T&A--1941 Hysterectomy--2007    Family  History: Reviewed history from 10/25/2009 and no changes required. Ovarian  Cancer--daughter Breast Cancer-- daughter HTN-- mother Daughter- ovarian cancer Mom- died at 17 HTN, died of old age Dad-died at 70, smoker COPD lung cancer Uncle- died from hodgkins disease in 12's sister- living, 7 years older, breast cancer, bell's palsy, CHF Daughter Gavin Pound- Stage 1 breast cancer s/p lumpectomy and radiation. no colon cancer  Social History: Reviewed history from 10/25/2009 and no changes required. Widow/Widower Never Smoked Alcohol use-rare alcohol, 2 drinks/week Regular exercise-no  2 daughters  Review of Systems       Some incontinence and back pain but no fevers or chills, productive cough, hemoptysis, dysphasia, odynophagia, melena, hematochezia, dysuria, hematuria, rash, seizure activity, orthopnea, PND,  claudication. Remaining systems are negative.   Vital Signs:  Patient profile:   77 year old female Height:      66.5 inches Weight:      176 pounds BMI:     28.08 Pulse rate:   72 / minute Pulse rhythm:   regular Resp:     18 per minute BP sitting:   128 / 80  (left arm) Cuff size:   large  Vitals Entered By: Vikki Ports (Nov 01, 2009 12:03 PM)  Physical Exam  General:  Well developed/well nourished in NAD Skin warm/dry Patient not depressed No peripheral clubbing Back-normal HEENT-normal/normal eyelids Neck supple/normal carotid upstroke bilaterally; no bruits; no JVD; no thyromegaly chest - CTA/ normal expansion CV - RRR/normal S1 and S2; no murmurs, rubs or gallops;  PMI nondisplaced Abdomen -NT/ND, no HSM, no mass, + bowel sounds, no bruit 2+ femoral pulses, no  bruits Ext-trace edema, chords, 2+ DP Neuro-grossly nonfocal     EKG  Procedure date:  11/01/2009  Findings:      Normal sinus rhythm at a rate of 72. Axis normal. No ST changes.  Impression & Recommendations:  Problem # 1:  CHEST PAIN, HX OF (ICD-V12.50) Symptoms atypical chronic  since the 1980s. They do not sound cardiac. I will ask for her last stress test to be forwarded to [place] from [facility]. If negative no further workup.  Problem # 2:  HYPERTENSION (ICD-401.9) Blood pressure control medications. Will continue. Her updated medication list for this problem includes:    Lasix 40 Mg Tabs (Furosemide) .Marland Kitchen... 1/2 tablet daily    Dilt-xr 120 Mg Xr24h-cap (Diltiazem hcl) .Marland Kitchen... Take 1 capsule by mouth once a day  Problem # 3:  FIBRILLATION, ATRIAL (ICD-427.31) Patient carries a diagnosis of atrial fibrillation. I will have her records from [facility] sent to [place] for review. This will include previous TSH, and echocardiogram. If she indeed does have atrial fibrillation then we will continue her Cardizem and Coumadin long-term. She has embolic risk factors at age 25, female sex and hypertension. We can consider dabigitran in the future. Her Coumadin is monitored in Coumadin clinic. Goal INR 2-3. Her updated medication list for this problem includes:    Warfarin Sodium 5 Mg Tabs (Warfarin sodium) .Marland Kitchen... Take 1 tablet one day and alternate 1/2 tablet the next.  Problem # 4:  COUMADIN THERAPY (ICD-V58.61) Goal INR 2-3. Monitored in our Coumadin clinic.  Patient Instructions: 1)  Your physician recommends that you schedule a follow-up appointment in: 3 months

## 2010-07-10 NOTE — Progress Notes (Signed)
Summary: Coumadin F/U  Phone Note Outgoing Call   Summary of Call: Kennon Rounds,  Ms Stjulien is new patient of mine who is on coumadin for hx or AF.  She is on 5mg  alternating with 2.5 mg.  I have left a message for her to return my call and when I speak to her I plan to instruct her to take  5mg  5/4 and 5/5 and will defer further management to you thereafter.  Thank you,  Sandford Craze Initial call taken by: Lemont Fillers FNP,  Oct 11, 2009 10:20 AM  Follow-up for Phone Call        Pt has appt with Coumadin Clinic on 5/6.  Will be happy to monitor and adjust therapy.  Follow-up by: Weston Brass PharmD,  Oct 11, 2009 4:10 PM

## 2010-07-10 NOTE — Medication Information (Signed)
Summary: rov/mw  Anticoagulant Therapy  Managed by: Bethena Midget, RN, BSN Referring MD: Jens Som PCP: Sandford Craze, NP Supervising MD: Jens Som MD, Arlys John Indication 1: Atrial Fibrillation Lab Used: [facility] [place] Site: [address] INR POC 2.0 INR RANGE 2.0-3.0  Dietary changes: no    Health status changes: no    Bleeding/hemorrhagic complications: no    Recent/future hospitalizations: no    Any changes in medication regimen? no    Recent/future dental: no  Any missed doses?: yes     Details: Missed one dose over the weekend on one day.  Is patient compliant with meds? yes       Allergies: No Known Drug Allergies  Anticoagulation Management History:      The patient is taking warfarin and comes in today for a routine follow up visit.  Positive risk factors for bleeding include an age of 75 years or older.  The bleeding index is 'intermediate risk'.  Positive CHADS2 values include History of HTN and Age > 75 years old.  Her last INR was 1.86.  Anticoagulation responsible provider: Jens Som MD, Arlys John.  INR POC: 2.0.  Cuvette Lot#: [id number].  Exp: 03/2011.    Anticoagulation Management Assessment/Plan:      The patient's current anticoagulation dose is Warfarin sodium 5 mg tabs: Take 1/2 tablet Mon. Wed, Fri. and 1 tablet all other days..  The target INR is 2.0-3.0.  The next INR is due 04/10/2010.  Anticoagulation instructions were given to patient.  Results were reviewed/authorized by Bethena Midget, RN, BSN.  She was notified by Ilean Skill D candidate.         Prior Anticoagulation Instructions: INR 2.7  Continue current dosage regimen of 1 tablet on sunday, tuesday, thursday, and saturday. And 1/2 tablet on monday, wednesday, and friday. See you in 4 weeks.  Current Anticoagulation Instructions: INR 2.0  Continue taking 1 tablet everyday except for 1/2 tablet on Monday, Wednesday, and Friday. Recheck in 4 weeks.

## 2010-07-10 NOTE — Medication Information (Signed)
Summary: rov/sel  Anticoagulant Therapy  Managed by: Lyna Poser, PharmD Referring MD: Jens Som PCP: Sandford Craze, NP Supervising MD: Clifton James MD,Christopher Indication 1: Atrial Fibrillation Lab Used: [facility]  Site: [address] INR POC 2.4 INR RANGE 2.0-3.0  Dietary changes: no    Health status changes: no    Bleeding/hemorrhagic complications: no    Recent/future hospitalizations: yes       Details: having a botox injection in bladder but got pushed to  a later date bc she has a uti  Any changes in medication regimen? yes       Details: will start a new antibiotic today for UTI. Doesn't know what it is, hasn't picked it up yet.  Recent/future dental: no  Any missed doses?: no       Is patient compliant with meds? yes      Comments: Will call us to tell us what antibiotic she is taking and will reschedule if needed.   Allergies: No Known Drug Allergies  Anticoagulation Management History:      The patient is taking warfarin and comes in today for a routine follow up visit.  Positive risk factors for bleeding include an age of 75 years or older.  The bleeding index is 'intermediate risk'.  Positive CHADS2 values include History of HTN and Age > 30 years old.  Her last INR was 1.86.  Anticoagulation responsible provider: Clifton James MD,Christopher.  INR POC: 2.4.  Cuvette Lot#: [id number].  Exp: 03/2011.    Anticoagulation Management Assessment/Plan:      The patient's current anticoagulation dose is Warfarin sodium 5 mg tabs: Take 1/2 tablet Mon. Wed, Fri. and 1 tablet all other days..  The target INR is 2.0-3.0.  The next INR is due 05/08/2010.  Anticoagulation instructions were given to patient.  Results were reviewed/authorized by Lyna Poser, PharmD.         Prior Anticoagulation Instructions: INR 2.0  Continue taking 1 tablet everyday except for 1/2 tablet on Monday, Wednesday, and Friday. Recheck in 4 weeks.  Current Anticoagulation  Instructions: INR 2.4 Continue taking a half tablet on monday, wednesday, and friday. And 1 tablet all other days. Recheck in 4 weeks.

## 2010-07-10 NOTE — Medication Information (Signed)
Summary: rov/mw  Anticoagulant Therapy  Managed by: Lyna Poser, PharmD Referring MD: Jens Som PCP: Sandford Craze, NP Supervising MD: Clifton James MD,Christopher Indication 1: Atrial Fibrillation Lab Used: [facility] [place] Site: [address] INR POC 2.2 INR RANGE 2.0-3.0  Dietary changes: no    Health status changes: yes       Details: Has cold; had a botox injection into bladder  ~1 mo ago  Bleeding/hemorrhagic complications: no    Recent/future hospitalizations: no    Any changes in medication regimen? yes       Details: Taking Vicks Drink product & Claritin but has not had any since 11/28  Recent/future dental: no  Any missed doses?: no       Is patient compliant with meds? yes       Allergies: No Known Drug Allergies  Anticoagulation Management History:      The patient is taking warfarin and comes in today for a routine follow up visit.  Positive risk factors for bleeding include an age of 24 years or older.  The bleeding index is 'intermediate risk'.  Positive CHADS2 values include History of HTN and Age > 16 years old.  Her last INR was 1.86.  Anticoagulation responsible provider: Clifton James MD,Christopher.  INR POC: 2.2.  Cuvette Lot#: [id number].  Exp: 05/2011.    Anticoagulation Management Assessment/Plan:      The patient's current anticoagulation dose is Warfarin sodium 5 mg tabs: Take 1/2 tablet Mon. Wed, Fri. and 1 tablet all other days..  The target INR is 2.0-3.0.  The next INR is due 06/05/2010.  Anticoagulation instructions were given to patient.  Results were reviewed/authorized by Lyna Poser, PharmD.  She was notified by Hoy Register, PharmD Candd[place].         Prior Anticoagulation Instructions: INR 2.4 Continue taking a half tablet on monday, wednesday, and friday. And 1 tablet all other days. Recheck in 4 weeks.  Current Anticoagulation Instructions: INR 2.2 Continue previous dose of 1 tablet everyday except 0.5 tablet on Monday,  Wednesday, and Friday Recheck INR in 4 weeks

## 2010-07-10 NOTE — Progress Notes (Signed)
  ROI faxed to [facility] @ [phone], CD was recieved back today of Pt's records gave to [facility].For [place] to review. Cala Bradford Mesiemore  March 05, 2010 3:28 PM     Appended Document:  CD of  Pt's Records are in the [facility] Box in [employer]

## 2010-07-10 NOTE — Assessment & Plan Note (Signed)
Review of gastrointestinal problems: 1. Adenomatous colon polyps, Colonoscopy June 2011 found one small tubular adenoma however it was a very poor prep and she was recommended to have repeat at 6 month interval    History of Present Illness Visit Type: Follow-up Visit Primary GI MD: Rob Bunting MD Primary Provider: Sandford Craze, NP Requesting Provider: Sandford Craze, NP Chief Complaint: colon recall History of Present Illness:     Carla Curtis 77 year old woman whom I last saw about 6 months ago at the time of a colonoscopy, see those results summarized above. It was quite a poor prep because she took the entire split dose prep in the evening before the procedure which is not the way it is designed to work. She was a bit upset about our instructions, she followed the manufacturer's instructions rather than our personalized instructions that we handed to her during the previous at which I reviewed again with her today.           Current Medications (verified): 1)  Oxybutynin Chloride 10 Mg Xr24h-Tab (Oxybutynin Chloride) .... Take 1 Tablet By Mouth Once A Day As Needed. 2)  Lasix 40 Mg Tabs (Furosemide) .... 1/2 Tablet Daily As Needed 3)  Dilt-Xr 120 Mg Xr24h-Cap (Diltiazem Hcl) .... Take 1 Capsule By Mouth Once A Day 4)  Warfarin Sodium 5 Mg Tabs (Warfarin Sodium) .... Take 1/2 Tablet Mon. Wed, Fri. and 1 Tablet All Other Days. 5)  Fosamax 70 Mg Tabs (Alendronate Sodium) .... One Tablet By Mouth Once Weekly.  Take With Water 30 Minutes Before First Food/med.  Sit Upright For 30 Minutes After Dose  Allergies (verified): No Known Drug Allergies  Vital Signs:  Patient profile:   77 year old female Height:      66.5 inches Weight:      177 pounds BMI:     28.24 Pulse rate:   80 / minute Pulse rhythm:   regular BP sitting:   104 / 62  (left arm) Cuff size:   regular  Vitals Entered By: June McMurray CMA Duncan Dull) (May 15, 2010 3:30 PM)  Physical Exam  Additional  Exam:  Constitutional: generally well appearing Psychiatric: alert and oriented times 3 Abdomen: soft, non-tender, non-distended, normal bowel sounds    Impression & Recommendations:  Problem # 1:  Adenomatous colon polyps, poor prep on previous colonoscopy I explained to her the importance of splitting the prep dose the way is designed to work most effectively. We will go over these prep times with her very explicitly this time. She will need to hold her Coumadin again 5 days before the procedure, this has been okayed by her cardiologist in the past.  Patient Instructions: 1)  Will plan on repeat colonoscopy later this month. 2)  You will have to hold the coumadin again 5 days prior to the procedure. 3)  The prep is a split dosing type prep. 4)  The medication list was reviewed and reconciled.  All changed / newly prescribed medications were explained.  A complete medication list was provided to the patient / caregiver.  Appended Document: Orders Update/Movi    Clinical Lists Changes  Medications: Added new medication of MOVIPREP 100 GM  SOLR (PEG-KCL-NACL-NASULF-NA ASC-C) As per prep instructions. - Signed Rx of MOVIPREP 100 GM  SOLR (PEG-KCL-NACL-NASULF-NA ASC-C) As per prep instructions.;  #1 x 0;  Signed;  Entered by: Chales Abrahams CMA (AAMA);  Authorized by: Rachael Fee MD;  Method used: Electronically to [facility] [phone]*, [phone]  [address], [place], [place], [place]  [address], Ph: [phone], Fax: [phone] Orders: Added new Test order of Colonoscopy (Colon) - Signed    Prescriptions: MOVIPREP 100 GM  SOLR (PEG-KCL-NACL-NASULF-NA ASC-C) As per prep instructions.  #1 x 0   Entered by:   Chales Abrahams CMA (AAMA)   Authorized by:   Rachael Fee MD   Signed by:   Chales Abrahams CMA (AAMA) on 05/15/2010   Method used:   [facility]ronically to        [facility] [phone]* (retail)       [address]       [place], [place]   [address]       Ph: [phone]       Fax: [phone]   RxID:   [id number]

## 2010-07-10 NOTE — Assessment & Plan Note (Signed)
Summary: NEW PT EST CARE/DT   Vital Signs:  Patient profile:   77 year old female Height:      66.5 inches Weight:      175.25 pounds BMI:     27.96 Temp:     98.1 degrees F oral Pulse rate:   60 / minute Pulse rhythm:   regular Resp:     16 per minute BP sitting:   120 / 80  (right arm) Cuff size:   regular  Vitals Entered By: Mervin Kung CMA (Oct 10, 2009 1:49 PM) CC: room 5  Pt new to area and needs PCP.  Pt currently on Coumadin and needs this monitored. Pt. had episode x 2 days of left groin pain radiating down leg. Is Patient Diabetic? No   CC:  room 5  Pt new to area and needs PCP.  Pt currently on Coumadin and needs this monitored. Pt. had episode x 2 days of left groin pain radiating down leg.Marland Kitchen  History of Present Illness: Carla Curtis is a 77 year old female who is here to establish care.  She recently moved from [place] two weeks ago with her daughter who has stage 4 ovarian cancer.    AF- has been on coumadin since 2009.  Notes that her INR has been stable on current dose x 1 year.    She notes some left groin pain which she developed yesterday.  This pain radiates down the inner aspect of her left leg. She has been in the process of moving. Feels some better today.  Denies associated low back pain. (+ history of laminectomy L4-5)  HTN- Notes that this has been well controlled.    Preventative- has never had a bone density performed. Had total hysterectomy in 2007 (uterine prolapse) Mammogram was done last summer and was normal.    Incontinence- had botox injection in bladder in February which helped      Preventive Screening-Counseling & Management  Alcohol-Tobacco     Alcohol drinks/day: occasional     Alcohol type: wine     Smoking Status: never  Caffeine-Diet-Exercise     Caffeine use/day: 4-5 cups coffee daily     Does Patient Exercise: no  Allergies (verified): No Known Drug Allergies  Past History:  Past Medical History: Heart  Arrhythmia HTN  Past Surgical History: Laminectomy L4, L5-- 1989 & 1990 Bilateral Mammoplasty T&A--1941 Hysterectomy--2007  Family History: Ovarian  Cancer--daughter Breast Cancer-- daughter HTN-- mother  Daughter- ovarian cancer Mom- died at 56 HTN, died of old age Dad-died at 38, smoker COPD lung cancer Uncle- died from hodgkins disease in 78's sister- living, 7 years older, breast cancer, bell's palsy, CHF Daughter Gavin Pound- Stage 1 breast cancer s/p lumpectomy and radiation.  Social History: Widow/Widower Never Smoked Alcohol use-rare alcohol, 2 drinks/week Regular exercise-no Smoking Status:  never Caffeine use/day:  4-5 cups coffee daily Does Patient Exercise:  no  Review of Systems       Constitutional: Denies Fever ENT:  Denies nasal congestion or sore throat. Resp: Denies cough CV:  notes + chest pain intermittent which is relieved by "cold water" not exacerbated by activity GI:  Denies nausea or vomitting or diarrhea GU: Denies dysuria, occasional incontinence Lymphatic: Denies lymphadenopathy Musculoskeletal:  Denies muscle/joint pain Skin:  Denies Rashes Psychiatric: Denies depression or anxiety, + stress due to recent move and two daughters with cancer Neuro: Denies numbness     Physical Exam  General:  Well-developed,well-nourished,in no acute distress; alert,appropriate and cooperative throughout  examination Head:  Normocephalic and atraumatic without obvious abnormalities. No apparent alopecia or balding. Eyes:  PERRLA Ears:  External ear exam shows no significant lesions or deformities.  Otoscopic examination reveals clear canals, tympanic membranes are intact bilaterally without bulging, retraction, inflammation or discharge. Hearing is grossly normal bilaterally. Mouth:  Oral mucosa and oropharynx without lesions or exudates.  Teeth in good repair. Neck:  No deformities, masses, or tenderness noted. Breasts:  Exam limited by old calcified  breast implants.   Lungs:  Normal respiratory effort, chest expands symmetrically. Lungs are clear to auscultation, no crackles or wheezes. Heart:  Normal rate and regular rhythm. S1 and S2 normal without gallop, murmur, click, rub or other extra sounds. Abdomen:  Bowel sounds positive,abdomen soft and non-tender without masses, organomegaly or hernias noted. Msk:  No deformity or scoliosis noted of thoracic or lumbar spine.   Extremities:  2+ bilateral LE edema Neurologic:  alert & oriented X3 and gait normal.   Skin:  Intact without rashes Cervical Nodes:  No lymphadenopathy noted Psych:  Cognition and judgment appear intact. Alert and cooperative with normal attention span and concentration. No apparent delusions, illusions, hallucinations   Impression & Recommendations:  Problem # 1:  FIBRILLATION, ATRIAL (ICD-427.31) Assessment Improved EKG today notes sinus bradycardia.  She does note intermittent episodes of chest pain which are not exacerbated by activity.  Will refer to cardiology (Dr. Jens Som) and coumadin clinic.  Check PT/INR today. Plan referral to cardiology Her updated medication list for this problem includes:    Dilt-xr 120 Mg Xr24h-cap (Diltiazem hcl) .Marland Kitchen... Take 1 capsule by mouth once a day    Warfarin Sodium 5 Mg Tabs (Warfarin sodium) .Marland Kitchen... Take 1 tablet one day and alternate 1/2 tablet the next.  Orders: INR/PT-[facility] ([id number]) [address] Coumadin Clinic Referral (Coumadin clinic) Cardiology Referral (Cardiology)  Problem # 2:  INCONTINENCE (ICD-788.30)  Will refer to urology to estabish and to possibly continue therapeutic botox injections if indicated  Orders: Urology Referral (Urology)  Problem # 3:  HYPERTENSION (ICD-401.9) Assessment: Comment Only BP appears stable continue current meds Her updated medication list for this problem includes:    Lasix 40 Mg Tabs (Furosemide) .Marland Kitchen... 1/2 tablet daily    Dilt-xr 120 Mg Xr24h-cap (Diltiazem hcl) .Marland Kitchen... Take 1  capsule by mouth once a day  Orders: T-Comprehensive Metabolic Panel ([id number])  BP today: 120/80  Problem # 4:  CHEST PAIN, HX OF (ICD-V12.50) Assessment: Comment Only Patient tells me that she has been prescribed SL nitro in the past.  EKG today is without acute signs or ischemia,  refer to cardiology for further evaluation. Orders: Cardiology Referral (Cardiology)  Problem # 5:  LEG PAIN, LEFT (ICD-729.5) Assessment: New Pain is improved today, may be musculoskeletal in nature.  If pain worsens can consider imaging of the spine.    Complete Medication List: 1)  Oxybutynin Chloride 10 Mg Xr24h-tab (Oxybutynin chloride) .... Take 1 tablet by mouth once a day as needed. 2)  Lasix 40 Mg Tabs (Furosemide) .... 1/2 tablet daily 3)  Dilt-xr 120 Mg Xr24h-cap (Diltiazem hcl) .... Take 1 capsule by mouth once a day 4)  Warfarin Sodium 5 Mg Tabs (Warfarin sodium) .... Take 1 tablet one day and alternate 1/2 tablet the next.  Other Orders: Gastroenterology Referral (GI) Dexa scan (Dexa scan)  Patient Instructions: 1)  You will be called about your referral to Cardiology and Urology. 2)  Go to ER if you develop severe chest pain. 3)  Take 650-1000mg  of  Tylenol every 4-6 hours as needed for relief of pain.  AVOID taking more than 4000mg   in a 24 hour period (can cause liver damage in higher doses). 4)  Please follow up in 1 month.   Preventive Care Screening  Last Pneumovax:    Date:  06/12/2009    Results:  historical   Mammogram:    Date:  03/13/2009    Results:  normal   Last Tetanus Booster:    Date:  06/10/2006    Results:  Historical   Pap Smear:    Date:  06/10/2005    Results:  normal      Current Allergies (reviewed today): No known allergies

## 2010-07-10 NOTE — Progress Notes (Signed)
Summary: information about a antibiotic  Phone Note Call from Patient Call back at Home Phone [phone] Call back at [phone]   Caller: Patient Summary of Call: Pt calling with information about a medication Cefdinir 300mg  1 every 12hour Initial call taken by: Judie Grieve,  April 11, 2010 8:41 AM  Follow-up for Phone Call        Spoke with pt.  Abx okay to take with Coumadin.  She will keep her appt scheduled for 11/29.  Follow-up by: Weston Brass PharmD,  April 11, 2010 2:47 PM

## 2010-07-10 NOTE — Assessment & Plan Note (Signed)
Summary: 3 month follow up/mhf--Rm 5   Vital Signs:  Patient profile:   77 year old female Height:      66.5 inches Weight:      170.75 pounds BMI:     27.25 Temp:     97.9 degrees F oral Pulse rate:   72 / minute Pulse rhythm:   regular Resp:     16 per minute BP sitting:   132 / 74  (right arm) Cuff size:   regular  Vitals Entered By: Mervin Kung CMA Duncan Dull) (February 19, 2010 10:48 AM) CC: Rm 5  3 month f/u. Needs refill on oxybutynin. Is Patient Diabetic? No Pain Assessment Patient in pain? no        Primary Care Provider:  Sandford Craze, NP  CC:  Rm 5  3 month f/u. Needs refill on oxybutynin.Marland Kitchen  History of Present Illness: Carla Curtis is a 77 year old female who presents today for follow up.  Urinary incontinence-  using - seeing Dr. Noe Gens. Continues Oxybutynin with some improvement.  She has had some improvement in the past with Botox injections.    GI-  had colo- + polyp.  Apparently had poor prep- which frustrates pt.  She is to have f/u colo in 6-12 months.  Osteoporosis- using fosamax. Stopped vitamin D supplement and caltrate.    Allergies (verified): No Known Drug Allergies  Past History:  Past Medical History: Last updated: 11/01/2009 Atrial fibrillation  (on coumadin since mid 2010) HTN Incontinence  Past Surgical History: Last updated: 10/25/2009 Laminectomy L4, L5-- 1989 & 1990 Bilateral Mammoplasty T&A--1941 Hysterectomy--2007    Physical Exam  General:  Well-developed,well-nourished,in no acute distress; alert,appropriate and cooperative throughout examination Head:  Normocephalic and atraumatic without obvious abnormalities. No apparent alopecia or balding. Neck:  No deformities, masses, or tenderness noted. Lungs:  Normal respiratory effort, chest expands symmetrically. Lungs are clear to auscultation, no crackles or wheezes. Heart:  Normal rate and regular rhythm. S1 and S2 normal without gallop, murmur, click, rub or other  extra sounds. Extremities:  1+ left pedal edema and 1+ right pedal edema.     Impression & Recommendations:  Problem # 1:  OSTEOPENIA (ICD-733.90) Assessment Unchanged Will have patient resume caltrate, continue Fosamax. Her updated medication list for this problem includes:    Fosamax 70 Mg Tabs (Alendronate sodium) ..... One tablet by mouth once weekly.  take with water 30 minutes before first food/med.  sit upright for 30 minutes after dose  Problem # 2:  HYPERTENSION (ICD-401.9) Assessment: Comment Only BP remains at goal, continue current meds.  Her updated medication list for this problem includes:    Lasix 40 Mg Tabs (Furosemide) .Marland Kitchen... 1/2 tablet daily as needed    Dilt-xr 120 Mg Xr24h-cap (Diltiazem hcl) .Marland Kitchen... Take 1 capsule by mouth once a day  BP today: 132/74 Prior BP: 120/70 (01/31/2010)  Labs Reviewed: K+: 4.0 (10/10/2009) Creat: : 0.64 (10/10/2009)     Problem # 3:  INCONTINENCE (ICD-788.30) Assessment: Unchanged  pt to f/u with urology, continue Oxybutynin  Orders: Prescription Created Electronically [phone])  Complete Medication List: 1)  Oxybutynin Chloride 10 Mg Xr24h-tab (Oxybutynin chloride) .... Take 1 tablet by mouth once a day as needed. 2)  Lasix 40 Mg Tabs (Furosemide) .... 1/2 tablet daily as needed 3)  Dilt-xr 120 Mg Xr24h-cap (Diltiazem hcl) .... Take 1 capsule by mouth once a day 4)  Warfarin Sodium 5 Mg Tabs (Warfarin sodium) .... Take 1 tablet one day and alternate 1/2 tablet  the next. 5)  Fosamax 70 Mg Tabs (Alendronate sodium) .... One tablet by mouth once weekly.  take with water 30 minutes before first food/med.  sit upright for 30 minutes after dose 6)  Caltrate 600+d 600-400 Mg-unit Tabs (Calcium carbonate-vitamin d) .... One tablet by mouth two times a day  Patient Instructions: 1)  Please follow up in 6 months, sooner if problems or concerns. Prescriptions: OXYBUTYNIN CHLORIDE 10 MG XR24H-TAB (OXYBUTYNIN CHLORIDE) Take 1 tablet by  mouth once a day as needed.  #30 x 5   Entered and Authorized by:   Lemont Fillers FNP   Signed by:   Lemont Fillers FNP on 02/19/2010   Method used:   Electronically to        [facility] # [phone]* (retail)       [address]       [place], [place]  [address]       Ph: [phone]       Fax: [phone]   RxID:   [id number]   Current Allergies (reviewed today): No known allergies

## 2010-07-10 NOTE — Progress Notes (Signed)
Summary: Coumadin Clearance   ---- Converted from flag ---- ---- 11/21/2009 2:05 PM, Ferman Hamming, MD, [facility] wrote: ok to dc coumadin 5 days prior to procedure and resume after  ---- 11/14/2009 10:34 AM, Bethena Midget, RN, BSN wrote: Pt pending Colonoscopy on 12/04/09 with Dr Christella Hartigan. Is she cleared to stop 5 days prior? ------------------------------

## 2010-07-10 NOTE — Progress Notes (Signed)
Summary: Medication Update  Phone Note Call from Patient Call back at Home Phone [phone]   Caller: Patient Summary of Call: patient called and left voice message stating she was calling to provide medication update. She states she is taking Warfarin 5mg  once daily as directed  Initial call taken by: Glendell Docker CMA,  March 02, 2010 4:36 PM  Follow-up for Phone Call        Cardiology's records note Warfarin  (5mg ) every day except take 1/2 tablet (2.5mg ) on Mon, Wed, and Fri.  Could you please verify with patient that is what she is doing? Follow-up by: Lemont Fillers FNP,  March 02, 2010 4:45 PM  Additional Follow-up for Phone Call Additional follow up Details #1::        call placed to patient at [phone], no answer. A voice message was left for patient to call back with medication directions Additional Follow-up by: Glendell Docker CMA,  March 02, 2010 5:07 PM    Additional Follow-up for Phone Call Additional follow up Details #2::    Left message on machine to return my call. Nicki Guadalajara Fergerson CMA Duncan Dull)  March 05, 2010 9:06 AM   Pt returned my call and states she takes Coumadin 5mg   1/2 tablet Monday, Wednesday & Friday. 1 tablet all other days. Nicki Guadalajara Fergerson CMA Duncan Dull)  March 05, 2010 5:14 PM

## 2010-07-10 NOTE — Progress Notes (Signed)
Summary: rx auth--amoxicillin  Phone Note Call from Patient Call back at [phone]   Caller: Patient Call For: Lemont Fillers FNP Summary of Call: Pt left voice message stating that abx from yesterday was not received at [facility] on [address] in [facility].   Called pharmacy and left rx auth. on voicemail.  Spoke to pt. and advised rx was left on pharmacy voicemail today.  Mervin Kung CMA  November 22, 2009 9:46 AM

## 2010-07-10 NOTE — Letter (Signed)
   [place] [facility] [address] [place], [place]  [address]  [place] Phone: [phone]      November 14, 2009   Carla Curtis [address] [place], [place] [address]  RE:  LAB RESULTS  Dear  Ms. [place],  The following is an interpretation of your most recent lab tests.  Please take note of any instructions provided or changes to medications that have resulted from your lab work.  THYROID STUDIES:  Thyroid studies normal TSH: 1.418    Your vitamin D is slightly low.  I would like for you to add a calcium supplement with vitamin D (available OTC) and start Fosamax once weekly.  See below.     Medications Prescribed or Changed CALTRATE 600+D 600-400 MG-UNIT TABS (CALCIUM CARBONATE-VITAMIN D) one tab by mouth twice daily FOSAMAX 70 MG TABS (ALENDRONATE SODIUM) one tablet by mouth once weekly.  Take with water 30 minutes before first food/med.  Sit upright for 30 minutes after dose   Sincerely Yours,    Lemont Fillers FNP

## 2010-07-10 NOTE — Letter (Signed)
Summary: New Patient letter  [facility]  [address] [place], [place] [address]   Phone: [phone]  Fax: [phone]       10/11/2009 MRN: [id number]  Carla Curtis [address] [place], [place]  [address]  Dear Carla Curtis,  Welcome to the Gastroenterology Division at [facility].    You are scheduled to see Dr.  Christella Hartigan on 10-25-09 at 1:30p.m. on the 3rd floor at [facility], [address]  We ask that you try to arrive at our office 15 minutes prior to your appointment time to allow for check-in.  We would like you to complete the enclosed self-administered evaluation form prior to your visit and bring it with you on the day of your appointment.  We will review it with you.  Also, please bring a complete list of all your medications or, if you prefer, bring the medication bottles and we will list them.  Please bring your insurance card so that we may make a copy of it.  If your insurance requires a referral to see a specialist, please bring your referral form from your primary care physician.  Co-payments are due at the time of your visit and may be paid by cash, check or credit card.     Your office visit will consist of a consult with your physician (includes a physical exam), any laboratory testing he/she may order, scheduling of any necessary diagnostic testing (e.g. x-ray, ultrasound, CT-scan), and scheduling of a procedure (e.g. Endoscopy, Colonoscopy) if required.  Please allow enough time on your schedule to allow for any/all of these possibilities.    If you cannot keep your appointment, please call [phone] to cancel or reschedule prior to your appointment date.  This allows [place] the opportunity to schedule an appointment for another patient in need of care.  If you do not cancel or reschedule by 5 p.m. the business day prior to your appointment date, you will be charged a $50.00 late cancellation/no-show fee.    Thank you for choosing  [facility] for your medical needs.  We appreciate the opportunity to care for you.  Please visit [place] at our website  to learn more about our practice.                     Sincerely,                                                             The Gastroenterology Division

## 2010-07-10 NOTE — Assessment & Plan Note (Signed)
Summary: 1 month follow up/mhf-- rm 5   Vital Signs:  Patient profile:   77 year old female Height:      66.5 inches Weight:      174 pounds BMI:     27.76 Temp:     98.2 degrees F oral Pulse rate:   72 / minute Pulse rhythm:   regular Resp:     16 per minute BP sitting:   120 / 70  (right arm) Cuff size:   regular  Vitals Entered By: Mervin Kung CMA (November 13, 2009 9:59 AM) CC: room 5  1 month follow up. Still having intermittent right groin pain. Is Patient Diabetic? No   Primary Care Provider:  Sandford Craze, NP  CC:  room 5  1 month follow up. Still having intermittent right groin pain.Marland Kitchen  History of Present Illness: Carla Curtis is a 77 year old female who presents today for follow up.   1)AF- patient has seen Dr Jens Som and established with the Coumadin clinic.  Stable.  2)Urinary incontinence- she continues on oxybutynin and has upcoming appointment to establish with Urology.  She has received Botox bladder injections in the past which she has found helpful.  3)Groin pain-  Notes that she occasionally will develop some pain in the right lower quadrant, about 2-3 x a week.  Pain is brief and is worsened by large meals and improves with laying down.    Problems Prior to Update: 1)  Osteopenia  (ICD-733.90) 2)  Screening Mammogram For High-risk Patient  (ICD-V76.11) 3)  Coumadin Therapy  (ICD-V58.61) 4)  Special Screening For Malignant Neoplasms Colon  (ICD-V76.51) 5)  Leg Pain, Left  (ICD-729.5) 6)  Chest Pain, Hx of  (ICD-V12.50) 7)  Preventive Health Care  (ICD-V70.0) 8)  Hypertension  (ICD-401.9) 9)  Fibrillation, Atrial  (ICD-427.31) 10)  Incontinence  (ICD-788.30)  Medications Prior to Update: 1)  Oxybutynin Chloride 10 Mg Xr24h-Tab (Oxybutynin Chloride) .... Take 1 Tablet By Mouth Once A Day As Needed. 2)  Lasix 40 Mg Tabs (Furosemide) .... 1/2 Tablet Daily 3)  Dilt-Xr 120 Mg Xr24h-Cap (Diltiazem Hcl) .... Take 1 Capsule By Mouth Once A Day 4)   Warfarin Sodium 5 Mg Tabs (Warfarin Sodium) .... Take 1 Tablet One Day and Alternate 1/2 Tablet The Next. 5)  Nitrofurantoin Macrocrystal 100 Mg Caps (Nitrofurantoin Macrocrystal) .... Take One Capsule Every 12 Hours  Allergies (verified): No Known Drug Allergies  Physical Exam  General:  Well-developed,well-nourished,in no acute distress; alert,appropriate and cooperative throughout examination Lungs:  Normal respiratory effort, chest expands symmetrically. Lungs are clear to auscultation, no crackles or wheezes. Heart:  Normal rate and regular rhythm. S1 and S2 normal without gallop, murmur, click, rub or other extra sounds. Abdomen:  Bowel sounds positive,abdomen soft and non-tender without masses, organomegaly or hernias noted.  Pulses:  2+ bilateral DP/PT pulses bilaterally Extremities:  1+ left pedal edema and 1+ right pedal edema.     Impression & Recommendations:  Problem # 1:  HYPERTENSION (ICD-401.9) Assessment Unchanged Notes that she is not taking lasix due to her issues with urinary incontinence as this exacerbates her incontinence.  Denies SOB- does have 1+ bilateral lower extremity edema.  HR and BP are stable, continue diltiazem Her updated medication list for this problem includes:    Lasix 40 Mg Tabs (Furosemide) .Marland Kitchen... 1/2 tablet daily    Dilt-xr 120 Mg Xr24h-cap (Diltiazem hcl) .Marland Kitchen... Take 1 capsule by mouth once a day  BP today: 120/70 Prior BP: 128/80 (  11/01/2009)  Labs Reviewed: K+: 4.0 (10/10/2009) Creat: : 0.64 (10/10/2009)     Problem # 2:  INCONTINENCE (ICD-788.30) Assessment: Unchanged Plan f/u with urology as scheduled  Problem # 3:  OSTEOPENIA (ICD-733.90) Assessment: New Plan to order below tests.  After review of below tests, will likely initiate Fosamax as 10 year prob of major osteoporotic fx is 14% based on FRAX score.     Orders: T-TSH [id number]) T-Assay of Vitamin D [id number]) T-Alkaline Phosphatase [phone]) T-Urine, Sodium  [id number]) Prescription Created Electronically [phone])  Complete Medication List: 1)  Oxybutynin Chloride 10 Mg Xr24h-tab (Oxybutynin chloride) .... Take 1 tablet by mouth once a day as needed. 2)  Lasix 40 Mg Tabs (Furosemide) .... 1/2 tablet daily 3)  Dilt-xr 120 Mg Xr24h-cap (Diltiazem hcl) .... Take 1 capsule by mouth once a day 4)  Warfarin Sodium 5 Mg Tabs (Warfarin sodium) .... Take 1 tablet one day and alternate 1/2 tablet the next.  Other Orders: Mammogram (Screening) (Mammo)  Patient Instructions: 1)  Please schedule a follow-up appointment in 3 months. 2)  Please complete your mammogram downstairs. 3)  Have a nice Summer! Prescriptions: OXYBUTYNIN CHLORIDE 10 MG XR24H-TAB (OXYBUTYNIN CHLORIDE) Take 1 tablet by mouth once a day as needed.  #30 x 2   Entered and Authorized by:   Lemont Fillers FNP   Signed by:   Lemont Fillers FNP on 11/13/2009   Method used:   Electronically to        [facility] # [phone]* (retail)       [address]       [place], [place]  [address]       Ph: [phone]       Fax: [phone]   RxID:   [id number]   Current Allergies (reviewed today): No known allergies

## 2010-07-11 DIAGNOSIS — Z7901 Long term (current) use of anticoagulants: Secondary | ICD-10-CM | POA: Insufficient documentation

## 2010-07-11 DIAGNOSIS — I4891 Unspecified atrial fibrillation: Secondary | ICD-10-CM

## 2010-07-12 NOTE — Progress Notes (Signed)
Summary: lab results  Phone Note Outgoing Call   Summary of Call: Please call patient and let her know that her CT of the abdomen shows some diverticulosis (no sign of infection) and some constipation.  She can add some miralax as below.   Initial call taken by: Lemont Fillers FNP,  June 29, 2010 9:08 AM  Follow-up for Phone Call        Left message on machine to return my call. Mervin Kung CMA Duncan Dull)  June 29, 2010 9:40 AM  Pt advised. Nicki Guadalajara Fergerson CMA Duncan Dull)  June 29, 2010 10:23 AM      New/Updated Medications: MIRALAX  POWD (POLYETHYLENE GLYCOL 3350) 17 grams by mouth in 8 ounces of water once daily as needed for constipation

## 2010-07-12 NOTE — Assessment & Plan Note (Signed)
Summary: follow up/mhf--rm 5   Vital Signs:  Patient profile:   77 year old female Height:      66.5 inches Weight:      175 pounds BMI:     27.92 Temp:     97.5 degrees F oral Pulse rate:   72 / minute Pulse rhythm:   regular Resp:     16 per minute BP sitting:   134 / 72  (right arm) Cuff size:   regular  Vitals Entered By: Mervin Kung CMA Duncan Dull) (June 26, 2010 9:44 AM) CC: Pt here for follow up. States she has been having intermittent right side abdominal pain x 3 months. Worse with standing and usually goes away when she sits down. Is Patient Diabetic? No Pain Assessment Patient in pain? yes      Comments Pt needs refill on Fosamax.  Pt agrees all med doses and directions are correct. Nicki Guadalajara Fergerson CMA Duncan Dull)  June 26, 2010 9:52 AM    Primary Care Provider:  Sandford Craze, NP  CC:  Pt here for follow up. States she has been having intermittent right side abdominal pain x 3 months. Worse with standing and usually goes away when she sits down.Marland Kitchen  History of Present Illness: Ms.  Bou is a 77 year old female who presents today for follow up.    1)  RLQ tenderness- x 3 months- improves with sitting.  Denies associated fever, nausea, vomitting, diarrhea.    2) AF-  Continues to follow at the coumadin clinic.  Was off of coumadin briefly for colonscopy.    3) Urinary incontinence-  Continues to follow with [facility] Noe Gens).  Had a renal ultrasound which was reportedly normal. Had a second botox injection in December.  Did not help as much as the first time.  Wakes up every 2-3 hours.  4) Colon polyp-  Path report show adenoma (05/30/10)  Allergies (verified): No Known Drug Allergies  Past History:  Past Medical History: Last updated: 11/01/2009 Atrial fibrillation  (on coumadin since mid 2010) HTN Incontinence  Past Surgical History: Last updated: 10/25/2009 Laminectomy L4, L5-- 1989 & 1990 Bilateral Mammoplasty T&A--1941 Hysterectomy--2007     Review of Systems       see HPI  Physical Exam  General:  Well-developed,well-nourished,in no acute distress; alert,appropriate and cooperative throughout examination Head:  Normocephalic and atraumatic without obvious abnormalities. No apparent alopecia or balding. Eyes:  PERRLA Ears:  External ear exam shows no significant lesions or deformities.  Otoscopic examination reveals clear canals, tympanic membranes are intact bilaterally without bulging, retraction, inflammation or discharge. Hearing is grossly normal bilaterally. Neck:  No deformities, masses, or tenderness noted. Lungs:  Normal respiratory effort, chest expands symmetrically. Lungs are clear to auscultation, no crackles or wheezes. Heart:  Normal rate and regular rhythm. S1 and S2 normal without gallop, murmur, click, rub or other extra sounds. Skin:  some hyperpigmentation of shins noted    Impression & Recommendations:  Problem # 1:  RLQ PAIN (ICD-789.03) Assessment New Afebrile, no acute abdominal findings.  Will plan to check CT with contrast to further evaluate.  WBC is normal.   Orders: TLB-CBC Platelet - w/Differential (85025-CBCD) CT with Contrast (CT w/ contrast)  Problem # 2:  OSTEOPENIA (ICD-733.90) Assessment: Unchanged Wants to hold fosamax.  Will repeat bone density in june.  If numbers are worse, plan to restart. The following medications were removed from the medication list:    Fosamax 70 Mg Tabs (Alendronate sodium) ..... One tablet by  mouth once weekly.  take with water 30 minutes before first food/med.  sit upright for 30 minutes after dose  Problem # 3:  INCONTINENCE (ICD-788.30) Assessment: Unchanged Defer management to urology.  Complete Medication List: 1)  Oxybutynin Chloride 10 Mg Xr24h-tab (Oxybutynin chloride) .... Take 1 tablet by mouth once a day as needed. 2)  Lasix 40 Mg Tabs (Furosemide) .... 1/2 tablet daily as needed 3)  Dilt-xr 120 Mg Xr24h-cap (Diltiazem hcl) .... Take 1  capsule by mouth once a day 4)  Warfarin Sodium 5 Mg Tabs (Warfarin sodium) .... Take 1/2 tablet mon. wed, fri. and 1 tablet all other days.  Other Orders: INR/PT-[facility] (81191) TLB-BMP (Basic Metabolic Panel-BMET) (80048-METABOL)  Patient Instructions: 1)  Please complete your lab work on the first floor today.  2)  Please schedule a medicare wellness visit in the next 1-2 months. 3)  Come fasting to this appointment.  4)  Please complete your CT on the first floor.   5)  Go to the ER if you develop worsening abdominal pain.   Orders Added: 1)  INR/PT-[facility] [85610] 2)  TLB-CBC Platelet - w/Differential [85025-CBCD] 3)  TLB-BMP (Basic Metabolic Panel-BMET) [80048-METABOL] 4)  CT with Contrast [CT w/ contrast] 5)  Est. Patient Level III [47829]    Current Allergies (reviewed today): No known allergies    Preventive Care Screening  Colonoscopy:    Date:  05/30/2010    Results:  Adenomatous Polyp

## 2010-07-12 NOTE — Letter (Signed)
Summary: Results Letter  [facility]  [address] [place], [place] [address]   Phone: [phone]  Fax: [phone]        June 01, 2010 MRN: [id number]    [facility] [address] [place], [place]  [address]    Dear Ms. Dara Lords,   The polyp removed during your recent procedure was proven to be adenomatous.  These are pre-cancerous polyps that may have grown into cancers if they had not been removed.  Based on current nationally recognized surveillance guidelines, I recommend that you have a repeat colonoscopy in 1 year.   We will therefore put your information in our reminder system and will contact you in 1 year to schedule a repeat procedure.  Please call if you have any questions or concerns.       Sincerely,  Rachael Fee MD  This letter has been electronically signed by your physician.  Appended Document: Results Letter Letter Mailed

## 2010-07-12 NOTE — Medication Information (Signed)
Summary: Coumadin Clinic  Anticoagulant Therapy  Managed by: Bethena Midget, RN, BSN Referring MD: Jens Som PCP: Sandford Craze, NP Supervising MD: Riley Kill MD, Maisie Fus Indication 1: Atrial Fibrillation Lab Used: [facility] [place] Site: [address] PT 25.0 INR POC 2.25 INR RANGE 2.0-3.0  Dietary changes: no    Health status changes: no    Bleeding/hemorrhagic complications: no    Recent/future hospitalizations: no    Any changes in medication regimen? no    Recent/future dental: no  Any missed doses?: no       Is patient compliant with meds? yes      Comments: INR per lab   Allergies: No Known Drug Allergies  Anticoagulation Management History:      Her anticoagulation is being managed by telephone today.  Positive risk factors for bleeding include an age of 4 years or older.  The bleeding index is 'intermediate risk'.  Positive CHADS2 values include History of HTN and Age > 55 years old.  Her last INR was 2.25.  Prothrombin time is 25.0.  Anticoagulation responsible provider: Riley Kill MD, Maisie Fus.  INR POC: 2.25.    Anticoagulation Management Assessment/Plan:      The patient's current anticoagulation dose is Warfarin sodium 5 mg tabs: Take 1/2 tablet Mon. Wed, Fri. and 1 tablet all other days..  The target INR is 2.0-3.0.  The next INR is due 07/24/2010.  Anticoagulation instructions were given to patient.  Results were reviewed/authorized by Bethena Midget, RN, BSN.  She was notified by Bethena Midget, RN, BSN.         Prior Anticoagulation Instructions: INR 2.2 Continue previous dose of 1 tablet everyday except 0.5 tablet on Monday, Wednesday, and Friday Recheck INR in 4 weeks  Current Anticoagulation Instructions: INR 2.25  LMOM for pt. Weston Brass PharmD  June 27, 2010 4:01 PM  [facility] for pt to call for dosing. Bethena Midget, RN, BSN  June 28, 2010 11:19 AM Spoke with pt and dosing completed. Appt s/c for here in office. Bethena Midget, RN, BSN   June 29, 2010 10:17 AM

## 2010-07-12 NOTE — Procedures (Signed)
Summary: Colonoscopy  Patient: Carla Curtis Note: All result statuses are Final unless otherwise noted.  Tests: (1) Colonoscopy (COL)   COL Colonoscopy           DONE     [facility]     [address]     [place], [place]  [address]           COLONOSCOPY PROCEDURE REPORT           PATIENT:  Carla, Curtis  MR#:  [id number]     BIRTHDATE:  1932/12/05, 77 yrs. old  GENDER:  female     ENDOSCOPIST:  Rachael Fee, MD     PROCEDURE DATE:  05/30/2010     PROCEDURE:  Colonoscopy with snare polypectomy     ASA CLASS:  Class II     INDICATIONS:  adenomatous polyp 6 months ago, poor prep greatly     limited the exam     MEDICATIONS:   Fentanyl 50 mcg IV, Versed 5 mg IV           DESCRIPTION OF PROCEDURE:   After the risks benefits and     alternatives of the procedure were thoroughly explained, informed     consent was obtained.  Digital rectal exam was performed and     revealed no rectal masses.   The [id number] endoscope     was introduced through the anus and advanced to the cecum, which     was identified by both the appendix and ileocecal valve, without     limitations.  The quality of the prep was good, using MoviPrep.     The instrument was then slowly withdrawn as the colon was fully     examined.     <<PROCEDUREIMAGES>>     FINDINGS:  A sessile polyp was found at the hepatic flexure. This     was 12mm across, removed with snare/cautery in piecemeal fashion     and sent to pathology (jar 1) (see image5).  Moderate     diverticulosis was found in the sigmoid to descending colon     segments (see image1).  This was otherwise a normal examination of     the colon (see image4, image3, and image6).   Retroflexed views in     the rectum revealed no abnormalities.    The scope was then     withdrawn from the patient and the procedure completed.     COMPLICATIONS:  None           ENDOSCOPIC IMPRESSION:     1) Sessile polyp at the hepatic flexure, removed and  sent to     pathology     2) Moderate diverticulosis in the sigmoid to descending colon     segments     3) Otherwise normal examination           RECOMMENDATIONS:     1) You will receive a letter within 1-2 weeks with the results     of your biopsy as well as final recommendations. Please call my     office if you have not received a letter after 3 weeks.     2) Given piecemeal resection, likely will need a repeat     colonoscopy in 1 year           ______________________________     Rachael Fee, MD           n.     eSIGNED:  Rachael Fee at 05/30/2010 09:34 AM           Linden Dolin, [id number]  Note: An exclamation mark (!) indicates a result that was not dispersed into the flowsheet. Document Creation Date: 05/30/2010 9:35 AM _______________________________________________________________________  (1) Order result status: Final Collection or observation date-time: 05/30/2010 09:29 Requested date-time:  Receipt date-time:  Reported date-time:  Referring Physician:   Ordering Physician: Rob Bunting [phone]) Specimen Source:  Source: Launa Grill Order Number: [id number] Lab site:   Appended Document: Colonoscopy recall     Procedures Next Due Date:    Colonoscopy: 05/2011

## 2010-07-16 ENCOUNTER — Telehealth: Payer: Self-pay | Admitting: Family

## 2010-07-24 ENCOUNTER — Encounter: Payer: Self-pay | Admitting: Cardiovascular Disease

## 2010-07-24 ENCOUNTER — Encounter (INDEPENDENT_AMBULATORY_CARE_PROVIDER_SITE_OTHER): Payer: Medicare Other

## 2010-07-24 DIAGNOSIS — Z7901 Long term (current) use of anticoagulants: Secondary | ICD-10-CM

## 2010-07-24 DIAGNOSIS — I4891 Unspecified atrial fibrillation: Secondary | ICD-10-CM

## 2010-07-26 NOTE — Progress Notes (Addendum)
Summary: exercise clearance  Phone Note Call from Patient Call back at Home Phone [phone]   Caller: Patient Call For: Lemont Fillers FNP Summary of Call: Pt states she is going to be participating in an exercise program at the [facility]. States the class will be tailored to each individuals needs. She is unsure of all the details but states there will be swimming, cardio, strength training and general nutrion counselling available. She would like to pick up a letter of medical clearance to give the [facility] on Wednesday. Please call pt when letter is ready. Can leave message on machine. Nicki Guadalajara Fergerson CMA Duncan Dull)  July 16, 2010 12:10 PM   Follow-up for Phone Call        Spoke with patient and she reports that she had a negative stress test approximately 1 year ago.  We do not have the records.  Will leave medical release for patient at the front desk and try to obtain those records.  Will then complete medical clearance for exercise. Pt is aware. Follow-up by: Lemont Fillers FNP,  July 16, 2010 1:08 PM     Appended Document: exercise clearance Pt signed medical release for [facility] [place], [place], [place]. Unable to obtain telephone # for this specific office. Left message for pt to call with correct telephone #.  Appended Document: exercise clearance Medical Release faxed to Dr Nelson Chimes in [place].  Appended Document: exercise clearance CD received from [facility] and forwarded to [facility] for review.

## 2010-08-01 NOTE — Medication Information (Signed)
Summary: Coumadin Clinic  Anticoagulant Therapy  Managed by: Weston Brass, PharmD Referring MD: Jens Som PCP: Sandford Craze, NP Supervising MD: Eden Emms MD, Theron Arista Indication 1: Atrial Fibrillation Lab Used: [facility] [place] Site: [address] INR POC 2.1 INR RANGE 2.0-3.0  Dietary changes: no    Health status changes: no    Bleeding/hemorrhagic complications: no    Recent/future hospitalizations: no    Any changes in medication regimen? no    Recent/future dental: no  Any missed doses?: no       Is patient compliant with meds? yes       Allergies: No Known Drug Allergies  Anticoagulation Management History:      The patient is taking warfarin and comes in today for a routine follow up visit.  Positive risk factors for bleeding include an age of 42 years or older.  The bleeding index is 'intermediate risk'.  Positive CHADS2 values include History of HTN and Age > 53 years old.  Her last INR was 2.25.  Anticoagulation responsible provider: Eden Emms MD, Theron Arista.  INR POC: 2.1.  Cuvette Lot#: [id number].  Exp: 06/2011.    Anticoagulation Management Assessment/Plan:      The patient's current anticoagulation dose is Warfarin sodium 5 mg tabs: Take 1/2 tablet Mon. Wed, Fri. and 1 tablet all other days., Warfarin sodium 5 mg tabs: Take as directed by coumadin clinic..  The target INR is 2.0-3.0.  The next INR is due 08/21/2010.  Anticoagulation instructions were given to patient.  Results were reviewed/authorized by Weston Brass, PharmD.  She was notified by Margot Chimes PharmD Candidate.         Prior Anticoagulation Instructions: INR 2.25  LMOM for pt. Weston Brass PharmD  June 27, 2010 4:01 PM  [facility] for pt to call for dosing. Bethena Midget, RN, BSN  June 28, 2010 11:19 AM Spoke with pt and dosing completed. Appt s/c for here in office. Bethena Midget, RN, BSN  June 29, 2010 10:17 AM   Current Anticoagulation Instructions: INR 2.1   Continue 1 tablet  everyday except for Mondays, Wednesdays, and Fridays when you only take 1/2 tablet.  Recheck INR in 4 weeks.

## 2010-09-25 ENCOUNTER — Ambulatory Visit (INDEPENDENT_AMBULATORY_CARE_PROVIDER_SITE_OTHER): Payer: Medicare Other | Admitting: *Deleted

## 2010-09-25 DIAGNOSIS — I4891 Unspecified atrial fibrillation: Secondary | ICD-10-CM

## 2010-09-25 DIAGNOSIS — Z7901 Long term (current) use of anticoagulants: Secondary | ICD-10-CM

## 2010-10-03 ENCOUNTER — Encounter: Payer: Self-pay | Admitting: Family

## 2010-10-03 DIAGNOSIS — I341 Nonrheumatic mitral (valve) prolapse: Secondary | ICD-10-CM | POA: Insufficient documentation

## 2010-10-03 DIAGNOSIS — I509 Heart failure, unspecified: Secondary | ICD-10-CM | POA: Insufficient documentation

## 2010-10-03 DIAGNOSIS — IMO0002 Reserved for concepts with insufficient information to code with codable children: Secondary | ICD-10-CM | POA: Insufficient documentation

## 2010-10-08 ENCOUNTER — Ambulatory Visit (INDEPENDENT_AMBULATORY_CARE_PROVIDER_SITE_OTHER): Payer: Medicare Other | Admitting: Family

## 2010-10-08 ENCOUNTER — Encounter: Payer: Self-pay | Admitting: Family

## 2010-10-08 VITALS — BP 118/76 | HR 78 | Temp 98.4°F | Resp 16 | Wt 178.0 lb

## 2010-10-08 DIAGNOSIS — L989 Disorder of the skin and subcutaneous tissue, unspecified: Secondary | ICD-10-CM

## 2010-10-08 NOTE — Assessment & Plan Note (Signed)
Skin lesion on nose- + hx of skin cancer. Will refer to dermatology for evaluation and routine skin monitoring.

## 2010-10-08 NOTE — Progress Notes (Signed)
  Subjective:    Patient ID: Carla Curtis, female    DOB: Jun 01, 1933, 77 y.o.   MRN: [id number]  HPI  Ms. Vangorden is a 77 yr old female who presents today for evaluation of a lesion on the tip of her nose. She reports that she has a history of skin cancer on her nose.  No other complaints, not itching, but it is a little tender.   Review of Systems    see HPI  Past Medical History  Diagnosis Date  . Normal cardiac stress test 01/27/09    lvef 73% done at [facility] (nuclear stress)    History   Social History  . Marital Status: Widowed    Spouse Name: N/A    Number of Children: N/A  . Years of Education: N/A   Occupational History  . Not on file.   Social History Main Topics  . Smoking status: Never Smoker   . Smokeless tobacco: Never Used  . Alcohol Use: Not on file  . Drug Use: Not on file  . Sexually Active: Not on file   Other Topics Concern  . Not on file   Social History Narrative  . No narrative on file    No past surgical history on file.  No family history on file.  No Known Allergies  Current Outpatient Prescriptions on File Prior to Visit  Medication Sig Dispense Refill  . warfarin (COUMADIN) 5 MG tablet Take by mouth as directed. Take 1/2 tablet M,W, Fr and 1 tablet all other days.        BP 118/76  Pulse 78  Temp(Src) 98.4 F (36.9 C) (Oral)  Resp 16  Wt 178 lb (80.74 kg)    Objective:   Physical Exam  Constitutional: She appears well-developed and well-nourished.  Cardiovascular: Normal rate and regular rhythm.   Pulmonary/Chest: Effort normal and breath sounds normal.  Skin:       + red, raised lesion on the tip of the nose.   Psychiatric: She has a normal mood and affect. Her behavior is normal. Judgment and thought content normal.          Assessment & Plan:

## 2010-10-08 NOTE — Patient Instructions (Signed)
You will be contacted about your referral to dermatology.  

## 2010-10-23 ENCOUNTER — Encounter: Payer: Medicare Other | Admitting: *Deleted

## 2010-10-30 ENCOUNTER — Ambulatory Visit (INDEPENDENT_AMBULATORY_CARE_PROVIDER_SITE_OTHER): Payer: Medicare Other | Admitting: *Deleted

## 2010-10-30 DIAGNOSIS — I4891 Unspecified atrial fibrillation: Secondary | ICD-10-CM

## 2010-10-30 DIAGNOSIS — Z7901 Long term (current) use of anticoagulants: Secondary | ICD-10-CM

## 2010-10-30 LAB — POCT INR: INR: 1.2

## 2010-11-13 ENCOUNTER — Ambulatory Visit (INDEPENDENT_AMBULATORY_CARE_PROVIDER_SITE_OTHER): Payer: Medicare Other | Admitting: *Deleted

## 2010-11-13 DIAGNOSIS — Z7901 Long term (current) use of anticoagulants: Secondary | ICD-10-CM

## 2010-11-13 DIAGNOSIS — I4891 Unspecified atrial fibrillation: Secondary | ICD-10-CM

## 2010-11-16 ENCOUNTER — Other Ambulatory Visit: Payer: Self-pay | Admitting: Cardiology

## 2010-12-03 ENCOUNTER — Telehealth: Payer: Self-pay | Admitting: Family

## 2010-12-03 MED ORDER — FUROSEMIDE 40 MG PO TABS: ORAL_TABLET | ORAL | Status: DC

## 2010-12-03 NOTE — Telephone Encounter (Signed)
SHE WOULD LIKE A REFILL OF FUROSEMIDE TO [facility]

## 2010-12-10 ENCOUNTER — Ambulatory Visit (INDEPENDENT_AMBULATORY_CARE_PROVIDER_SITE_OTHER): Payer: Medicare Other | Admitting: *Deleted

## 2010-12-10 DIAGNOSIS — Z7901 Long term (current) use of anticoagulants: Secondary | ICD-10-CM

## 2010-12-10 DIAGNOSIS — I4891 Unspecified atrial fibrillation: Secondary | ICD-10-CM

## 2010-12-10 MED ORDER — WARFARIN SODIUM 5 MG PO TABS: ORAL_TABLET | ORAL | Status: DC

## 2010-12-11 ENCOUNTER — Encounter: Payer: Medicare Other | Admitting: *Deleted

## 2011-01-07 ENCOUNTER — Ambulatory Visit (INDEPENDENT_AMBULATORY_CARE_PROVIDER_SITE_OTHER): Payer: Medicare Other | Admitting: Family

## 2011-01-07 ENCOUNTER — Other Ambulatory Visit: Payer: Self-pay | Admitting: Family

## 2011-01-07 ENCOUNTER — Ambulatory Visit (INDEPENDENT_AMBULATORY_CARE_PROVIDER_SITE_OTHER): Payer: Medicare Other | Admitting: *Deleted

## 2011-01-07 ENCOUNTER — Encounter: Payer: Self-pay | Admitting: Family

## 2011-01-07 ENCOUNTER — Ambulatory Visit (HOSPITAL_BASED_OUTPATIENT_CLINIC_OR_DEPARTMENT_OTHER)
Admission: RE | Admit: 2011-01-07 | Discharge: 2011-01-07 | Disposition: A | Discharge status: Home / Self Care | Payer: Medicare Other | Source: Ambulatory Visit | Attending: Family | Admitting: Family

## 2011-01-07 VITALS — BP 110/64 | HR 60 | Temp 97.9°F | Resp 16 | Ht 66.5 in | Wt 173.1 lb

## 2011-01-07 DIAGNOSIS — J40 Bronchitis, not specified as acute or chronic: Secondary | ICD-10-CM

## 2011-01-07 DIAGNOSIS — R05 Cough: Secondary | ICD-10-CM

## 2011-01-07 DIAGNOSIS — Z7901 Long term (current) use of anticoagulants: Secondary | ICD-10-CM

## 2011-01-07 DIAGNOSIS — R5383 Other fatigue: Secondary | ICD-10-CM | POA: Insufficient documentation

## 2011-01-07 DIAGNOSIS — Z8679 Personal history of other diseases of the circulatory system: Secondary | ICD-10-CM

## 2011-01-07 DIAGNOSIS — R5381 Other malaise: Secondary | ICD-10-CM | POA: Insufficient documentation

## 2011-01-07 DIAGNOSIS — Z1231 Encounter for screening mammogram for malignant neoplasm of breast: Secondary | ICD-10-CM

## 2011-01-07 DIAGNOSIS — R1031 Right lower quadrant pain: Secondary | ICD-10-CM

## 2011-01-07 DIAGNOSIS — Z1239 Encounter for other screening for malignant neoplasm of breast: Secondary | ICD-10-CM

## 2011-01-07 DIAGNOSIS — R059 Cough, unspecified: Secondary | ICD-10-CM | POA: Insufficient documentation

## 2011-01-07 DIAGNOSIS — I4891 Unspecified atrial fibrillation: Secondary | ICD-10-CM

## 2011-01-07 LAB — POCT INR: INR: 2.4

## 2011-01-07 MED ORDER — CEFUROXIME AXETIL 500 MG PO TABS: 500.0000 mg | ORAL_TABLET | Freq: Two times a day (BID) | ORAL | Status: AC

## 2011-01-07 NOTE — Progress Notes (Signed)
Subjective:    Patient ID: Carla Curtis, female    DOB: [birth year], 77 y.o.   MRN: [id number]  HPI  Carla Curtis is a 77 yr old female who presents today with chief complaint of cough. Cough- started on 7/22.  Cough is coarse.  Scant amount of yellow phlegm. Denies fever.  Has been very tired- sleeping "a lot." Denies chills.  Cough is somewhat improved today compared to yesterday.  Feels "wiped."  Right lower quadrant pain- mini lap, hysterectomy (TAH/BSO)  She has had some laparoscopic.  Last colo was June 2012- she is due for a 6 month follow up.  AF- Sees Dr. Jens Som. Maintained on coumadin, rate controlled.  Notes + hx of chest pain which radiates up to the neck, but that this has become more frequent recently.   Review of Systems See HPI  Past Medical History  Diagnosis Date  . Normal cardiac stress test 01/27/09    lvef 73% done at [facility] (nuclear stress)  . Atrial fibrillation     on Coumadin since mid 2010  . HTN (hypertension)   . Incontinence     History   Social History  . Marital Status: Widowed    Spouse Name: N/A    Number of Children: 2  . Years of Education: N/A   Occupational History  .     Social History Main Topics  . Smoking status: Never Smoker   . Smokeless tobacco: Never Used  . Alcohol Use: Yes     rare- 2 drinks/ week  . Drug Use: Not on file  . Sexually Active: Not on file   Other Topics Concern  . Not on file   Social History Narrative   Regular Exercise- no    Past Surgical History  Procedure Date  . Laminectomy 1989 and 1990    L4, L5  . Bilateral mammoplasty   . Tonsillectomy and adenoidectomy 1941  . Abdominal hysterectomy 2007  . Arm skin lesion biopsy / excision     pre-cancerous    Family History  Problem Relation Age of Onset  . Hypertension Mother   . COPD Father     smoker  . Cancer Father     lung  . Cancer Sister     breast  . Bell's palsy Sister   . Heart disease Sister     CHF  . Cancer Daughter    breast/ stage 1- s/p lumpectomy and radiation  . Cancer Other     Hodgkins Disease    No Known Allergies  Current Outpatient Prescriptions on File Prior to Visit  Medication Sig Dispense Refill  . diltiazem (CARDIZEM) 120 MG tablet TAKE 1 TABLET BY MOUTH EVERY DAY  34 tablet  2  . furosemide (LASIX) 40 MG tablet Take 1/2 tablet by mouth as needed.  15 tablet  1  . NON FORMULARY XANGO  Take 2 oz by mouth daily.       Marland Kitchen oxybutynin (DITROPAN-XL) 10 MG 24 hr tablet Take 10 mg by mouth daily as needed.       . warfarin (COUMADIN) 5 MG tablet Take as directed by Anticoagulation clinic   30 tablet  3    BP 110/64  Pulse 60  Temp(Src) 97.9 F (36.6 C) (Oral)  Resp 16  Ht 5' 6.5" (1.689 m)  Wt 173 lb 1.3 oz (78.509 kg)  BMI 27.52 kg/m2  SpO2 97%       Objective:   Physical Exam  Constitutional: She appears  well-developed and well-nourished.  HENT:  Head: Normocephalic and atraumatic.  Cardiovascular: Normal rate and regular rhythm.   Pulmonary/Chest: Effort normal and breath sounds normal.  Abdominal: Soft. Bowel sounds are normal.       Mild tenderness RLQ no guarding.   Psychiatric: She has a normal mood and affect. Her behavior is normal. Thought content normal.  skin:  tender areas of induration on bilateral shins consistent with bruising.       Assessment & Plan:

## 2011-01-07 NOTE — Patient Instructions (Addendum)
Please complete your chest x-ray on the first floor. Follow up in 1 week, call sooner if your symptoms worsen or if you do not continue to improve.

## 2011-01-07 NOTE — Assessment & Plan Note (Signed)
She has had a negative CT abdomen and recent colonoscopy.  She is s/p TAH/BSO.  I suspect that her pain may be due to adhesions vs musculoskeletal pain versus constipation.  No further work up at this time.

## 2011-01-07 NOTE — Assessment & Plan Note (Signed)
Pt saw Dr. Jens Som 6 mos ago, due for follow up in 1 yr.  Will arrange earlier f/u due to c/o increased frequency of chest pain.

## 2011-01-07 NOTE — Assessment & Plan Note (Signed)
Will treat with ceftin for bronchitis.  Plan to have pt return in 1 week for f/u and PT/INR check on abx.  Check CXR to exclude pneumonia.

## 2011-01-09 ENCOUNTER — Ambulatory Visit (HOSPITAL_BASED_OUTPATIENT_CLINIC_OR_DEPARTMENT_OTHER)
Admission: RE | Admit: 2011-01-09 | Discharge: 2011-01-09 | Disposition: A | Discharge status: Home / Self Care | Payer: Medicare Other | Source: Ambulatory Visit | Attending: Family | Admitting: Family

## 2011-01-09 DIAGNOSIS — Z1231 Encounter for screening mammogram for malignant neoplasm of breast: Secondary | ICD-10-CM | POA: Insufficient documentation

## 2011-01-16 ENCOUNTER — Ambulatory Visit (INDEPENDENT_AMBULATORY_CARE_PROVIDER_SITE_OTHER): Payer: Medicare Other | Admitting: Cardiology

## 2011-01-16 ENCOUNTER — Encounter: Payer: Self-pay | Admitting: Cardiology

## 2011-01-16 DIAGNOSIS — I4891 Unspecified atrial fibrillation: Secondary | ICD-10-CM

## 2011-01-16 DIAGNOSIS — Z8679 Personal history of other diseases of the circulatory system: Secondary | ICD-10-CM

## 2011-01-16 DIAGNOSIS — I10 Essential (primary) hypertension: Secondary | ICD-10-CM

## 2011-01-16 NOTE — Progress Notes (Signed)
HPI: Pleasant female for fu of atrial fibrillation. Patient from [place]. I do not have her prior cardiac records available. Pt was apparently having an echocardiogram and noted to be in atrial fibrillation. She has been on Cardizem and Coumadin since then. Also with chronic chest pain; had stress test in [facility] that apparently was neg. Since I saw her in August of 2011  the patient has dyspnea with more extreme activities but not with routine activities. It is relieved with rest. It is not associated with chest pain. There is no orthopnea, PND or pedal edema. There is no syncope or palpitations. There is no exertional chest pain. She does occasionally have chest pain that she has had intermittently for years. It is not related to exertion. It resolved after drinking something cold.  Current Outpatient Prescriptions  Medication Sig Dispense Refill  . cefUROXime (CEFTIN) 500 MG tablet Take 1 tablet (500 mg total) by mouth 2 (two) times daily.  14 tablet  0  . Cranberry 250 MG CAPS Take 2 capsules by mouth every morning.        . diltiazem (CARDIZEM) 120 MG tablet TAKE 1 TABLET BY MOUTH EVERY DAY  34 tablet  2  . furosemide (LASIX) 40 MG tablet Take 1/2 tablet by mouth as needed.  15 tablet  1  . NON FORMULARY XANGO  Take 2 oz by mouth daily.       Marland Kitchen oxybutynin (DITROPAN-XL) 10 MG 24 hr tablet Take 10 mg by mouth daily as needed.       . warfarin (COUMADIN) 5 MG tablet Take as directed by Anticoagulation clinic   30 tablet  3     Past Medical History  Diagnosis Date  . Normal cardiac stress test 01/27/09    lvef 73% done at [facility] (nuclear stress)  . Atrial fibrillation     on Coumadin since mid 2010  . HTN (hypertension)   . Incontinence     Past Surgical History  Procedure Date  . Laminectomy 1989 and 1990    L4, L5  . Bilateral mammoplasty   . Tonsillectomy and adenoidectomy 1941  . Abdominal hysterectomy 2007  . Arm skin lesion biopsy / excision     pre-cancerous     History   Social History  . Marital Status: Widowed    Spouse Name: N/A    Number of Children: 2  . Years of Education: N/A   Occupational History  .     Social History Main Topics  . Smoking status: Never Smoker   . Smokeless tobacco: Never Used  . Alcohol Use: Yes     rare- 2 drinks/ week  . Drug Use: Not on file  . Sexually Active: Not on file   Other Topics Concern  . Not on file   Social History Narrative   Regular Exercise- no    ROS: no fevers or chills, productive cough, hemoptysis, dysphasia, odynophagia, melena, hematochezia, dysuria, hematuria, rash, seizure activity, orthopnea, PND, pedal edema, claudication. Remaining systems are negative.  Physical Exam: Well-developed well-nourished in no acute distress.  Skin is warm and dry.  HEENT is normal.  Neck is supple. No thyromegaly.  Chest is clear to auscultation with normal expansion.  Cardiovascular exam is regular rate and rhythm.  Abdominal exam nontender or distended. No masses palpated. Extremities show trace edema. neuro grossly intact  ECG normal sinus rhythm at a rate of 66. No ST changes.

## 2011-01-16 NOTE — Assessment & Plan Note (Signed)
Blood pressure controlled. Continue present medications. 

## 2011-01-16 NOTE — Assessment & Plan Note (Signed)
Patient remains in sinus rhythm. Continue calcium blocker and Coumadin. She notes mild increased dyspnea. Repeat echocardiogram.

## 2011-01-16 NOTE — Patient Instructions (Signed)
Your physician has requested that you have an echocardiogram. Echocardiography is a painless test that uses sound waves to create images of your heart. It provides your doctor with information about the size and shape of your heart and how well your heart's chambers and valves are working. This procedure takes approximately one hour. There are no restrictions for this procedure.  Your physician wants you to follow-up in: ONE YEAR. You will receive a reminder letter in the mail two months in advance. If you don't receive a letter, please call our office to schedule the follow-up appointment.  

## 2011-01-16 NOTE — Assessment & Plan Note (Signed)
Symptoms unchanged and not consistent with cardiac etiology. EKG normal.

## 2011-01-18 ENCOUNTER — Telehealth: Payer: Self-pay | Admitting: Pharmacist

## 2011-01-18 NOTE — Telephone Encounter (Signed)
Message copied by Velda Shell on Fri Jan 18, 2011 12:06 PM ------      Message from: Lewayne Bunting      Created: Thu Jan 17, 2011  5:46 PM       Ok to hold coumadin prior to procedure and resume after.      Olga Millers            ----- Message -----         From: Mariane Masters, PHARMD         Sent: 01/17/2011  11:30 AM           To: Lewayne Bunting, MD            Pt having injection on 8/24.  Needs to hold Coumadin x 4 days. Please advise.

## 2011-01-18 NOTE — Telephone Encounter (Signed)
Pt aware okay to hold Coumadin.   

## 2011-01-22 ENCOUNTER — Encounter: Payer: Self-pay | Admitting: *Deleted

## 2011-02-04 ENCOUNTER — Encounter: Payer: Medicare Other | Admitting: *Deleted

## 2011-02-04 ENCOUNTER — Other Ambulatory Visit (HOSPITAL_COMMUNITY): Payer: Medicare Other | Admitting: Radiology

## 2011-02-08 ENCOUNTER — Ambulatory Visit (HOSPITAL_COMMUNITY): Payer: Medicare Other | Attending: Cardiology | Admitting: Radiology

## 2011-02-08 ENCOUNTER — Ambulatory Visit (INDEPENDENT_AMBULATORY_CARE_PROVIDER_SITE_OTHER): Payer: Medicare Other | Admitting: *Deleted

## 2011-02-08 DIAGNOSIS — I059 Rheumatic mitral valve disease, unspecified: Secondary | ICD-10-CM | POA: Insufficient documentation

## 2011-02-08 DIAGNOSIS — I10 Essential (primary) hypertension: Secondary | ICD-10-CM

## 2011-02-08 DIAGNOSIS — I4891 Unspecified atrial fibrillation: Secondary | ICD-10-CM | POA: Insufficient documentation

## 2011-02-08 DIAGNOSIS — R0989 Other specified symptoms and signs involving the circulatory and respiratory systems: Secondary | ICD-10-CM | POA: Insufficient documentation

## 2011-02-08 DIAGNOSIS — R0609 Other forms of dyspnea: Secondary | ICD-10-CM | POA: Insufficient documentation

## 2011-02-08 DIAGNOSIS — Z7901 Long term (current) use of anticoagulants: Secondary | ICD-10-CM

## 2011-02-08 LAB — POCT INR: INR: 1.6

## 2011-02-20 ENCOUNTER — Other Ambulatory Visit: Payer: Self-pay | Admitting: Cardiology

## 2011-02-22 ENCOUNTER — Ambulatory Visit (INDEPENDENT_AMBULATORY_CARE_PROVIDER_SITE_OTHER): Payer: Medicare Other | Admitting: *Deleted

## 2011-02-22 DIAGNOSIS — Z7901 Long term (current) use of anticoagulants: Secondary | ICD-10-CM

## 2011-02-22 DIAGNOSIS — I4891 Unspecified atrial fibrillation: Secondary | ICD-10-CM

## 2011-02-22 LAB — POCT INR: INR: 2.4

## 2011-03-22 ENCOUNTER — Ambulatory Visit (INDEPENDENT_AMBULATORY_CARE_PROVIDER_SITE_OTHER): Payer: Medicare Other | Admitting: *Deleted

## 2011-03-22 DIAGNOSIS — I4891 Unspecified atrial fibrillation: Secondary | ICD-10-CM

## 2011-03-22 DIAGNOSIS — Z7901 Long term (current) use of anticoagulants: Secondary | ICD-10-CM

## 2011-04-09 ENCOUNTER — Other Ambulatory Visit: Payer: Self-pay | Admitting: *Deleted

## 2011-04-09 MED ORDER — WARFARIN SODIUM 5 MG PO TABS: 5.0000 mg | ORAL_TABLET | ORAL | Status: DC

## 2011-04-19 ENCOUNTER — Ambulatory Visit (INDEPENDENT_AMBULATORY_CARE_PROVIDER_SITE_OTHER): Payer: Medicare Other | Admitting: *Deleted

## 2011-04-19 DIAGNOSIS — Z7901 Long term (current) use of anticoagulants: Secondary | ICD-10-CM

## 2011-04-19 DIAGNOSIS — I4891 Unspecified atrial fibrillation: Secondary | ICD-10-CM

## 2011-04-19 LAB — POCT INR: INR: 1.8

## 2011-05-09 ENCOUNTER — Encounter: Payer: Self-pay | Admitting: Gastroenterology

## 2011-05-10 ENCOUNTER — Ambulatory Visit (INDEPENDENT_AMBULATORY_CARE_PROVIDER_SITE_OTHER): Payer: Medicare Other | Admitting: *Deleted

## 2011-05-10 DIAGNOSIS — I4891 Unspecified atrial fibrillation: Secondary | ICD-10-CM

## 2011-05-10 DIAGNOSIS — Z7901 Long term (current) use of anticoagulants: Secondary | ICD-10-CM

## 2011-05-10 LAB — POCT INR: INR: 2.6

## 2011-06-07 ENCOUNTER — Encounter: Payer: Medicare Other | Admitting: *Deleted

## 2011-06-12 ENCOUNTER — Ambulatory Visit (INDEPENDENT_AMBULATORY_CARE_PROVIDER_SITE_OTHER): Payer: Medicare Other | Admitting: *Deleted

## 2011-06-12 DIAGNOSIS — Z7901 Long term (current) use of anticoagulants: Secondary | ICD-10-CM

## 2011-06-12 DIAGNOSIS — I4891 Unspecified atrial fibrillation: Secondary | ICD-10-CM

## 2011-06-12 NOTE — Patient Instructions (Signed)
Consistent with leafy green vegetables.

## 2011-07-03 ENCOUNTER — Encounter: Payer: Medicare Other | Admitting: *Deleted

## 2011-07-22 ENCOUNTER — Ambulatory Visit (INDEPENDENT_AMBULATORY_CARE_PROVIDER_SITE_OTHER): Payer: Medicare Other

## 2011-07-22 DIAGNOSIS — I4891 Unspecified atrial fibrillation: Secondary | ICD-10-CM

## 2011-07-22 DIAGNOSIS — Z7901 Long term (current) use of anticoagulants: Secondary | ICD-10-CM | POA: Diagnosis not present

## 2011-08-19 ENCOUNTER — Ambulatory Visit (INDEPENDENT_AMBULATORY_CARE_PROVIDER_SITE_OTHER): Payer: Medicare Other | Admitting: Pharmacist

## 2011-08-19 DIAGNOSIS — I4891 Unspecified atrial fibrillation: Secondary | ICD-10-CM | POA: Diagnosis not present

## 2011-08-19 DIAGNOSIS — Z7901 Long term (current) use of anticoagulants: Secondary | ICD-10-CM

## 2011-08-19 LAB — POCT INR: INR: 1.5

## 2011-09-09 ENCOUNTER — Ambulatory Visit (INDEPENDENT_AMBULATORY_CARE_PROVIDER_SITE_OTHER): Payer: Medicare Other | Admitting: Family

## 2011-09-09 ENCOUNTER — Ambulatory Visit: Payer: Medicare Other | Admitting: Internal Medicine

## 2011-09-09 ENCOUNTER — Ambulatory Visit (HOSPITAL_BASED_OUTPATIENT_CLINIC_OR_DEPARTMENT_OTHER)
Admission: RE | Admit: 2011-09-09 | Discharge: 2011-09-09 | Disposition: A | Discharge status: Home / Self Care | Payer: Medicare Other | Source: Ambulatory Visit | Attending: Family | Admitting: Family

## 2011-09-09 ENCOUNTER — Encounter: Payer: Self-pay | Admitting: Family

## 2011-09-09 ENCOUNTER — Other Ambulatory Visit: Payer: Self-pay | Admitting: Family

## 2011-09-09 VITALS — BP 110/72 | HR 65 | Temp 97.8°F | Resp 16 | Ht 66.5 in | Wt 169.0 lb

## 2011-09-09 DIAGNOSIS — R609 Edema, unspecified: Secondary | ICD-10-CM

## 2011-09-09 DIAGNOSIS — I868 Varicose veins of other specified sites: Secondary | ICD-10-CM

## 2011-09-09 DIAGNOSIS — I4891 Unspecified atrial fibrillation: Secondary | ICD-10-CM

## 2011-09-09 DIAGNOSIS — R209 Unspecified disturbances of skin sensation: Secondary | ICD-10-CM

## 2011-09-09 DIAGNOSIS — M79609 Pain in unspecified limb: Secondary | ICD-10-CM | POA: Diagnosis not present

## 2011-09-09 DIAGNOSIS — I10 Essential (primary) hypertension: Secondary | ICD-10-CM | POA: Diagnosis not present

## 2011-09-09 DIAGNOSIS — R32 Unspecified urinary incontinence: Secondary | ICD-10-CM | POA: Diagnosis not present

## 2011-09-09 DIAGNOSIS — M7989 Other specified soft tissue disorders: Secondary | ICD-10-CM

## 2011-09-09 LAB — BASIC METABOLIC PANEL
CO2: 28 mEq/L (ref 19–32)
Chloride: 107 mEq/L (ref 96–112)
Creat: 0.72 mg/dL (ref 0.50–1.10)
Glucose, Bld: 77 mg/dL (ref 70–99)
Sodium: 143 mEq/L (ref 135–145)

## 2011-09-09 MED ORDER — WARFARIN SODIUM 5 MG PO TABS: 5.0000 mg | ORAL_TABLET | ORAL | Status: DC

## 2011-09-09 NOTE — Assessment & Plan Note (Signed)
She plans to follow up with Urology for follow up and possibly another botox injection.

## 2011-09-09 NOTE — Assessment & Plan Note (Signed)
Refill provided on coumadin today- defer management and further refills to coumadin clinic.

## 2011-09-09 NOTE — Patient Instructions (Signed)
Please complete your ultrasound of the right leg.   Follow up in 3 months- sooner if problems/concerns.

## 2011-09-09 NOTE — Progress Notes (Signed)
Subjective:    Patient ID: Carla Curtis, female    DOB: July 30, 1932, 78 y.o.   MRN: [id number]  HPI  Ms.  Haertel is a 78 yr old female who presents today for follow up. Unfortunately, she recently lost her daughter to ovarian cancer.  She and her daughter were very close and they shared their home.  She is trying her best to work through her grief.  He daughter's two sons are staying with her right now which she is enjoying.  1) HTN- Denies chest pain.  Notes some mild LE swelling.  2) Tenderness right medial shin- notes that tenderness is still there, notes some underlying firmness of the tissue.    3) AF- she continues to follow with coumadin clinic. She is requesting a refill on coumadin as she lost her bottle in the midst of planning her daughter's funeral. She tells me she has an upcoming apt with coumadin clinic for INR recheck.  4) Urinary incontinence/cystocele- She tells me that she plans to follow up with her urologist (Dr. Lindley Magnus).  Review of Systems See HPI  Past Medical History  Diagnosis Date  . Normal cardiac stress test 01/27/09    lvef 73% done at [facility] (nuclear stress)  . Atrial fibrillation     on Coumadin since mid 2010  . HTN (hypertension)   . Incontinence     History   Social History  . Marital Status: Widowed    Spouse Name: N/A    Number of Children: 2  . Years of Education: N/A   Occupational History  .     Social History Main Topics  . Smoking status: Never Smoker   . Smokeless tobacco: Never Used  . Alcohol Use: Yes     rare- 2 drinks/ week  . Drug Use: Not on file  . Sexually Active: Not on file   Other Topics Concern  . Not on file   Social History Narrative   Regular Exercise- no    Past Surgical History  Procedure Date  . Laminectomy 1989 and 1990    L4, L5  . Bilateral mammoplasty   . Tonsillectomy and adenoidectomy 1941  . Abdominal hysterectomy 2007  . Arm skin lesion biopsy / excision     pre-cancerous    Family  History  Problem Relation Age of Onset  . Hypertension Mother   . COPD Father     smoker  . Cancer Father     lung  . Cancer Sister     breast  . Bell's palsy Sister   . Heart disease Sister     CHF  . Cancer Daughter     breast/ stage 1- s/p lumpectomy and radiation  . Cancer Other     Hodgkins Disease    No Known Allergies  Current Outpatient Prescriptions on File Prior to Visit  Medication Sig Dispense Refill  . Cranberry 250 MG CAPS Take 2 capsules by mouth every morning.        . diltiazem (CARDIZEM) 120 MG tablet TAKE 1 TABLET BY MOUTH EVERY DAY  30 tablet  11  . NON FORMULARY XANGO  Take 2 oz by mouth daily.       Marland Kitchen DISCONTD: warfarin (COUMADIN) 5 MG tablet Take 1 tablet (5 mg total) by mouth as directed.  30 tablet  3  . furosemide (LASIX) 40 MG tablet Take 1/2 tablet by mouth as needed.  15 tablet  1  . oxybutynin (DITROPAN-XL) 10 MG 24 hr tablet  Take 10 mg by mouth daily as needed.         BP 110/72  Pulse 65  Temp(Src) 97.8 F (36.6 C) (Oral)  Resp 16  Ht 5' 6.5" (1.689 m)  Wt 169 lb 0.6 oz (76.676 kg)  BMI 26.88 kg/m2  SpO2 96%       Objective:   Physical Exam  Constitutional: She appears well-developed and well-nourished. No distress.  Cardiovascular: Normal rate.  An irregular rhythm present.  Pulmonary/Chest: Effort normal. No respiratory distress. She has no wheezes. She has no rales. She exhibits no tenderness.  Musculoskeletal:       Bilateral LE edema 2+  Skin: Skin is warm and dry. No rash noted. No erythema. No pallor.          Mild tenderness noted at "x" no erythema.  Some hyperpigmentation consistent with chronic venous stasis is noted.    Psychiatric: Her behavior is normal. Judgment and thought content normal.       Pt became tearful upon discussing her daughter's death.           Assessment & Plan:

## 2011-09-09 NOTE — Assessment & Plan Note (Signed)
Pt noted to have tenderness/swelling, mild induration of right medial shin.  LE duplex was obtained and was neg for DVT, did not some varicosities in area of tenderness.

## 2011-09-09 NOTE — Assessment & Plan Note (Signed)
BP is stable on current meds.  Continue same.  

## 2011-09-10 ENCOUNTER — Encounter: Payer: Self-pay | Admitting: Family

## 2011-09-10 ENCOUNTER — Ambulatory Visit (INDEPENDENT_AMBULATORY_CARE_PROVIDER_SITE_OTHER): Payer: Medicare Other | Admitting: Pharmacist

## 2011-09-10 DIAGNOSIS — Z7901 Long term (current) use of anticoagulants: Secondary | ICD-10-CM | POA: Diagnosis not present

## 2011-09-10 DIAGNOSIS — I4891 Unspecified atrial fibrillation: Secondary | ICD-10-CM | POA: Diagnosis not present

## 2011-09-16 DIAGNOSIS — M999 Biomechanical lesion, unspecified: Secondary | ICD-10-CM | POA: Diagnosis not present

## 2011-10-01 DIAGNOSIS — H43819 Vitreous degeneration, unspecified eye: Secondary | ICD-10-CM | POA: Diagnosis not present

## 2011-10-02 ENCOUNTER — Ambulatory Visit (INDEPENDENT_AMBULATORY_CARE_PROVIDER_SITE_OTHER): Payer: Medicare Other | Admitting: Pharmacist

## 2011-10-02 DIAGNOSIS — I4891 Unspecified atrial fibrillation: Secondary | ICD-10-CM | POA: Diagnosis not present

## 2011-10-02 DIAGNOSIS — Z7901 Long term (current) use of anticoagulants: Secondary | ICD-10-CM

## 2011-10-08 DIAGNOSIS — N3941 Urge incontinence: Secondary | ICD-10-CM | POA: Diagnosis not present

## 2011-10-08 DIAGNOSIS — N390 Urinary tract infection, site not specified: Secondary | ICD-10-CM | POA: Diagnosis not present

## 2011-10-08 DIAGNOSIS — R3915 Urgency of urination: Secondary | ICD-10-CM | POA: Diagnosis not present

## 2011-10-16 DIAGNOSIS — Z86010 Personal history of colonic polyps: Secondary | ICD-10-CM | POA: Diagnosis not present

## 2011-10-16 DIAGNOSIS — K5900 Constipation, unspecified: Secondary | ICD-10-CM | POA: Diagnosis not present

## 2011-10-16 DIAGNOSIS — Z7901 Long term (current) use of anticoagulants: Secondary | ICD-10-CM | POA: Diagnosis not present

## 2011-10-16 DIAGNOSIS — I4891 Unspecified atrial fibrillation: Secondary | ICD-10-CM | POA: Diagnosis not present

## 2011-10-30 ENCOUNTER — Ambulatory Visit (INDEPENDENT_AMBULATORY_CARE_PROVIDER_SITE_OTHER): Payer: Medicare Other | Admitting: *Deleted

## 2011-10-30 DIAGNOSIS — I4891 Unspecified atrial fibrillation: Secondary | ICD-10-CM | POA: Diagnosis not present

## 2011-10-30 DIAGNOSIS — Z7901 Long term (current) use of anticoagulants: Secondary | ICD-10-CM

## 2011-10-30 LAB — POCT INR: INR: 2.8

## 2011-11-02 ENCOUNTER — Other Ambulatory Visit: Payer: Self-pay | Admitting: Cardiology

## 2011-11-05 DIAGNOSIS — M999 Biomechanical lesion, unspecified: Secondary | ICD-10-CM | POA: Diagnosis not present

## 2011-11-26 DIAGNOSIS — M999 Biomechanical lesion, unspecified: Secondary | ICD-10-CM | POA: Diagnosis not present

## 2011-11-27 ENCOUNTER — Ambulatory Visit (INDEPENDENT_AMBULATORY_CARE_PROVIDER_SITE_OTHER): Payer: Medicare Other | Admitting: Cardiology

## 2011-11-27 ENCOUNTER — Ambulatory Visit (INDEPENDENT_AMBULATORY_CARE_PROVIDER_SITE_OTHER): Payer: Medicare Other | Admitting: *Deleted

## 2011-11-27 ENCOUNTER — Encounter: Payer: Self-pay | Admitting: Cardiology

## 2011-11-27 VITALS — BP 110/80 | HR 56 | Ht 67.0 in | Wt 169.0 lb

## 2011-11-27 DIAGNOSIS — I10 Essential (primary) hypertension: Secondary | ICD-10-CM

## 2011-11-27 DIAGNOSIS — Z8679 Personal history of other diseases of the circulatory system: Secondary | ICD-10-CM

## 2011-11-27 DIAGNOSIS — I4891 Unspecified atrial fibrillation: Secondary | ICD-10-CM

## 2011-11-27 DIAGNOSIS — I2510 Atherosclerotic heart disease of native coronary artery without angina pectoris: Secondary | ICD-10-CM | POA: Diagnosis not present

## 2011-11-27 DIAGNOSIS — Z7901 Long term (current) use of anticoagulants: Secondary | ICD-10-CM

## 2011-11-27 LAB — POCT INR: INR: 2.9

## 2011-11-27 NOTE — Assessment & Plan Note (Signed)
Continue Cardizem and Coumadin. 

## 2011-11-27 NOTE — Patient Instructions (Addendum)
Your physician wants you to follow-up in: ONE YEAR WITH DR CRENSHAW IN [place] You will receive a reminder letter in the mail two months in advance. If you don't receive a letter, please call our office to schedule the follow-up appointment.  

## 2011-11-27 NOTE — Progress Notes (Signed)
   HPI: Pleasant female for fu of atrial fibrillation. Patient from [place]. I do not have her prior cardiac records available. Pt was apparently having an echocardiogram and noted to be in atrial fibrillation. She has been on Cardizem and Coumadin since then. Also with chronic chest pain; had stress test in [facility] that apparently was neg. Echocardiogram here in August of 2012 showed normal LV function, mild left ventricular hypertrophy and moderate left atrial enlargement. I last saw her in August of 2012. Since then, there is no dyspnea on exertion, orthopnea or PND. She does have mild pedal edema. No exertional chest pain. No palpitations or syncope.   Current Outpatient Prescriptions  Medication Sig Dispense Refill  . Cranberry 250 MG CAPS Take 2 capsules by mouth every morning.        . diltiazem (CARDIZEM) 120 MG tablet TAKE 1 TABLET BY MOUTH EVERY DAY  30 tablet  11  . furosemide (LASIX) 40 MG tablet Take 1/2 tablet by mouth as needed.  15 tablet  1  . mirabegron ER (MYRBETRIQ) 25 MG TB24 Take 25 mg by mouth daily.      . NON FORMULARY XANGO  Take 2 oz by mouth daily.       Marland Kitchen warfarin (COUMADIN) 5 MG tablet Take 1 tablet (5 mg total) by mouth as directed.  30 tablet  0  . warfarin (COUMADIN) 5 MG tablet TAKE 1 TABLET (5 MG TOTAL) BY MOUTH AS DIRECTED.  30 tablet  3  . oxybutynin (DITROPAN-XL) 10 MG 24 hr tablet Take 10 mg by mouth daily as needed.          Past Medical History  Diagnosis Date  . Normal cardiac stress test 01/27/09    lvef 73% done at [facility] (nuclear stress)  . Atrial fibrillation     on Coumadin since mid 2010  . HTN (hypertension)   . Incontinence     Past Surgical History  Procedure Date  . Laminectomy 1989 and 1990    L4, L5  . Bilateral mammoplasty   . Tonsillectomy and adenoidectomy 1941  . Abdominal hysterectomy 2007  . Arm skin lesion biopsy / excision     pre-cancerous    History   Social History  . Marital Status: Widowed    Spouse  Name: N/A    Number of Children: 2  . Years of Education: N/A   Occupational History  .     Social History Main Topics  . Smoking status: Never Smoker   . Smokeless tobacco: Never Used  . Alcohol Use: Yes     rare- 2 drinks/ week  . Drug Use: Not on file  . Sexually Active: Not on file   Other Topics Concern  . Not on file   Social History Narrative   Regular Exercise- no    ROS: no fevers or chills, productive cough, hemoptysis, dysphasia, odynophagia, melena, hematochezia, dysuria, hematuria, rash, seizure activity, orthopnea, PND, claudication. Remaining systems are negative.  Physical Exam: Well-developed well-nourished in no acute distress.  Skin is warm and dry.  HEENT is normal.  Neck is supple.  Chest is clear to auscultation with normal expansion.  Cardiovascular exam is regular rate and rhythm.  Abdominal exam nontender or distended. No masses palpated. Extremities show mild erythema and trace to 1+ edema. neuro grossly intact  ECG Sinus rhythm at a rate of 56. No ST changes.

## 2011-11-27 NOTE — Patient Instructions (Addendum)
Holding coumadin 5 days prior to colonoscopy, last dose of coumadin will be 11/28/2011, patient is seeing Dr Jens Som today and will ask for clearance. Coumadin clinic [phone]

## 2011-11-27 NOTE — Assessment & Plan Note (Signed)
Symptoms are atypical and chronic. Electrocardiogram normal. No further cardiac evaluation at this time.

## 2011-11-27 NOTE — Assessment & Plan Note (Signed)
Blood pressure controlled. Continue present medications. 

## 2011-11-28 ENCOUNTER — Telehealth: Payer: Self-pay | Admitting: *Deleted

## 2011-11-28 NOTE — Telephone Encounter (Signed)
Left message on machine that Dr Jens Som gave clearance for holding coumadin 5 days prior to colonoscopy on the 12/04/2011.

## 2011-11-28 NOTE — Telephone Encounter (Signed)
Message copied by Carmela Hurt on Thu Nov 28, 2011 11:40 AM ------      Message from: Lewayne Bunting      Created: Wed Nov 27, 2011 11:40 AM      Regarding: RE: colonoscopy       OK to hold coumadin for 5 days prior to procedure and resume after.      Olga Millers            ----- Message -----         From: Carmela Hurt, RN         Sent: 11/27/2011   9:38 AM           To: Deliah Goody, RN, Lewayne Bunting, MD, #      Subject: colonoscopy                                              Scheduled for colonoscopy 12/04/2011 with Dr Norma Fredrickson, he has instructed her to hold coumadin 5 days prior to procedure. She is also seeing you today. This note is for clearance.            Thanks, Addison Lank, RN

## 2011-12-04 DIAGNOSIS — K5730 Diverticulosis of large intestine without perforation or abscess without bleeding: Secondary | ICD-10-CM | POA: Diagnosis not present

## 2011-12-04 DIAGNOSIS — Z1211 Encounter for screening for malignant neoplasm of colon: Secondary | ICD-10-CM | POA: Diagnosis not present

## 2011-12-04 DIAGNOSIS — Z86010 Personal history of colonic polyps: Secondary | ICD-10-CM | POA: Diagnosis not present

## 2011-12-04 LAB — HM COLONOSCOPY: HM Colonoscopy: NORMAL

## 2011-12-09 ENCOUNTER — Ambulatory Visit (INDEPENDENT_AMBULATORY_CARE_PROVIDER_SITE_OTHER): Payer: Medicare Other | Admitting: Family

## 2011-12-09 ENCOUNTER — Encounter: Payer: Self-pay | Admitting: Family

## 2011-12-09 VITALS — BP 116/66 | HR 54 | Temp 97.5°F | Resp 16 | Ht 65.5 in | Wt 170.1 lb

## 2011-12-09 DIAGNOSIS — E559 Vitamin D deficiency, unspecified: Secondary | ICD-10-CM

## 2011-12-09 DIAGNOSIS — D126 Benign neoplasm of colon, unspecified: Secondary | ICD-10-CM

## 2011-12-09 DIAGNOSIS — M899 Disorder of bone, unspecified: Secondary | ICD-10-CM | POA: Diagnosis not present

## 2011-12-09 DIAGNOSIS — I10 Essential (primary) hypertension: Secondary | ICD-10-CM | POA: Diagnosis not present

## 2011-12-09 DIAGNOSIS — R32 Unspecified urinary incontinence: Secondary | ICD-10-CM

## 2011-12-09 DIAGNOSIS — I4891 Unspecified atrial fibrillation: Secondary | ICD-10-CM

## 2011-12-09 DIAGNOSIS — E663 Overweight: Secondary | ICD-10-CM

## 2011-12-09 DIAGNOSIS — M949 Disorder of cartilage, unspecified: Secondary | ICD-10-CM

## 2011-12-09 DIAGNOSIS — M8580 Other specified disorders of bone density and structure, unspecified site: Secondary | ICD-10-CM

## 2011-12-09 NOTE — Patient Instructions (Addendum)
Please schedule your bone density at the front desk. Complete your blood work prior to leaving.  Follow up in 6 months.

## 2011-12-09 NOTE — Progress Notes (Signed)
Subjective:    Patient ID: Carla Curtis, female    DOB: 11-05-1932, 78 y.o.   MRN: [id number]  HPI   Subjective:   Patient here for Medicare annual wellness visit and management of other chronic and acute problems.  1) HTN- Denies chest pain. Notes some mild LE swelling.   2) AF- she continues to follow with coumadin clinic.  Also on diltiazem.    3) Urinary incontinence/cystocele- She tells me that she plans to follow up with her urologist (Dr. Lindley Magnus). She plans to start botox injection.    4) Osteopenia- She is not currently on fosamax.    Risk factors: heart disease  Roster of Physicians Providing Medical Care to Patient: Dr. Lindley Magnus Dr. Hazle Quant Dr. Jens Som  Activities of Daily Living  In your present state of health, do you have any difficulty performing the following activities? Preparing food and eating?: No  Bathing yourself: No  Getting dressed: No  Using the toilet:No  Moving around from place to place: No  In the past year have you fallen or had a near fall?:No    Home Safety: Has smoke detector and wears seat belts. No firearms. No excess sun exposure.  Diet and Exercise  Current exercise habits: No formal exercise.   Dietary issues discussed: healthy diet   Depression Screen  (Note: if answer to either of the following is "Yes", then a more complete depression screening is indicated)  Q1: Over the past two weeks, have you felt down, depressed or hopeless?no  Q2: Over the past two weeks, have you felt little interest or pleasure in doing things? no   The following portions of the patient's history were reviewed and updated as appropriate: allergies, current medications, past family history, past medical history, past social history, past surgical history and problem list.   Objective:   Vision:see nursing Hearing: able to hear forced whisper at 6 feet. Body mass index:see nursing Cognitive Impairment Assessment: cognition, memory and judgment appear  normal.   Physical Exam  Constitutional: She is oriented to person, place, and time. She appears well-developed and well-nourished. No distress.  HENT:  Head: Normocephalic and atraumatic.  Right Ear: Tympanic membrane and ear canal normal.  Left Ear: Tympanic membrane and ear canal normal.  Mouth/Throat: Oropharynx is clear and moist.  Eyes: Pupils are equal, round, and reactive to light. No scleral icterus.  Neck: Normal range of motion. No thyromegaly present.  Cardiovascular: irregular rate and regular rhythm.   No murmur heard. Pulmonary/Chest: Effort normal and breath sounds normal. No respiratory distress. He has no wheezes. She has no rales. She exhibits no tenderness.  Abdominal: Soft. Bowel sounds are normal. He exhibits no distension and no mass. There is no tenderness. There is no rebound and no guarding.  Musculoskeletal: She exhibits 2+ bilateral LE edema.  Lymphadenopathy:    She has no cervical adenopathy.  Neurological: She is alert and oriented to person, place, and time. She has normal reflexes. She exhibits normal muscle tone. Coordination normal.  Skin: Skin is warm and dry.  Psychiatric: She has a normal mood and affect. Her behavior is normal. Judgment and thought content normal.  Breasts: Examined lying Right: Without masses, retractions, discharge or axillary adenopathy. Hard breast implants are noted bilaterally Left: Without masses, retractions, discharge or axillary adenopathy.  GYN: deferred- pt is s/p TAH/BSO          Assessment & Plan:     Assessment:   Medicare wellness utd on preventive parameters  Encouraged patient to continue exercise and healthy diet.  Plan:   During the course of the visit the patient was educated and counseled about appropriate screening and preventive services including:       Fall prevention   Screening mammography  Bone densitometry screening  Vaccines / LABS       Immunizations up to date Patient Instructions  (the written plan) was given to the patient.     Review of Systems  Constitutional: Negative for unexpected weight change.  Respiratory: Negative for shortness of breath.   Cardiovascular: Negative for chest pain.  Gastrointestinal: Negative for nausea, vomiting and diarrhea.  Genitourinary: Positive for frequency.  Musculoskeletal: Negative for joint swelling.  Skin: Negative for rash.       Follows with derm due to hx of "cancer removed." she sees them 7/23  Neurological: Negative for headaches.  Hematological: Negative for adenopathy.       Objective:   Physical Exam        Assessment & Plan:

## 2011-12-10 LAB — BASIC METABOLIC PANEL
CO2: 28 mEq/L (ref 19–32)
Glucose, Bld: 95 mg/dL (ref 70–99)
Potassium: 4.3 mEq/L (ref 3.5–5.3)
Sodium: 142 mEq/L (ref 135–145)

## 2011-12-10 LAB — LIPID PANEL
Cholesterol: 208 mg/dL — ABNORMAL HIGH (ref 0–200)
Triglycerides: 81 mg/dL (ref <150)

## 2011-12-10 NOTE — Assessment & Plan Note (Signed)
BP Readings from Last 3 Encounters:  12/09/11 116/66  11/27/11 110/80  09/09/11 110/72   BP is stable on cardizem/lasix.  Continue same. Bmet is stable.

## 2011-12-10 NOTE — Assessment & Plan Note (Signed)
Unchanged. This is an ongoing problems with leakage. She is scheduled to receive botox injections per Urology.

## 2011-12-10 NOTE — Assessment & Plan Note (Signed)
Order dexa.  

## 2011-12-10 NOTE — Assessment & Plan Note (Signed)
Continues coumadin, rate stable.  Management per cardiology/coumadin clinic.

## 2011-12-13 ENCOUNTER — Encounter: Payer: Self-pay | Admitting: Family

## 2011-12-13 ENCOUNTER — Telehealth: Payer: Self-pay | Admitting: Family

## 2011-12-13 DIAGNOSIS — E559 Vitamin D deficiency, unspecified: Secondary | ICD-10-CM | POA: Insufficient documentation

## 2011-12-13 HISTORY — DX: Vitamin D deficiency, unspecified: E55.9

## 2011-12-13 MED ORDER — VITAMIN D (ERGOCALCIFEROL) 1.25 MG (50000 UNIT) PO CAPS: 50000.0000 [IU] | ORAL_CAPSULE | ORAL | Status: DC

## 2011-12-13 NOTE — Telephone Encounter (Signed)
Pls call pt and let her know that he vitamin d level is low.  I would like for her to add vitamin D 96045 units one weekly x 12 weeks.  Needs follow up vitamin D at 12 weeks (vitamin D deficiency).  Otherwise, cholesterol slightly elevated.  She should work on low fat/low cholesterol diet and exercise.

## 2011-12-13 NOTE — Telephone Encounter (Signed)
Notified pt. Future lab order placed for the week of 03/06/12. Order mailed to pt as reminder and copy given to the lab.

## 2011-12-17 DIAGNOSIS — M999 Biomechanical lesion, unspecified: Secondary | ICD-10-CM | POA: Diagnosis not present

## 2011-12-18 ENCOUNTER — Ambulatory Visit (INDEPENDENT_AMBULATORY_CARE_PROVIDER_SITE_OTHER): Payer: Medicare Other | Admitting: *Deleted

## 2011-12-18 ENCOUNTER — Ambulatory Visit (INDEPENDENT_AMBULATORY_CARE_PROVIDER_SITE_OTHER)
Admission: RE | Admit: 2011-12-18 | Discharge: 2011-12-18 | Disposition: A | Discharge status: Home / Self Care | Payer: Medicare Other | Source: Ambulatory Visit

## 2011-12-18 DIAGNOSIS — I4891 Unspecified atrial fibrillation: Secondary | ICD-10-CM | POA: Diagnosis not present

## 2011-12-18 DIAGNOSIS — M949 Disorder of cartilage, unspecified: Secondary | ICD-10-CM | POA: Diagnosis not present

## 2011-12-18 DIAGNOSIS — M8580 Other specified disorders of bone density and structure, unspecified site: Secondary | ICD-10-CM

## 2011-12-18 DIAGNOSIS — M899 Disorder of bone, unspecified: Secondary | ICD-10-CM | POA: Diagnosis not present

## 2011-12-18 DIAGNOSIS — Z7901 Long term (current) use of anticoagulants: Secondary | ICD-10-CM | POA: Diagnosis not present

## 2011-12-19 ENCOUNTER — Other Ambulatory Visit: Payer: Medicare Other

## 2011-12-23 ENCOUNTER — Telehealth: Payer: Self-pay | Admitting: Cardiology

## 2011-12-23 NOTE — Telephone Encounter (Signed)
Walk-in patient she states is  Scheduled for a Botox injection in her kidney on Monday 12/30/11 in Dr. Redmond Baseman office . She needs to stop Warfarin about 5 days prior the procedure. An appointment was made for pt to see Tereso Newcomer PA on Monday 12/30/11. Pt states she need the appointment this week. There are no  available appointment  at this time. Patient will re-scheduled the procedure and see if Dr. Jens Som will clear her for procedure when MD returns next week.Dr. Shearon Balo office # [phone] Ext. [phone] need to speak with Erskine Squibb.  Patient last office visit was on 11/27/11.

## 2011-12-23 NOTE — Telephone Encounter (Signed)
Patient called no answer.LMTC. 

## 2011-12-23 NOTE — Telephone Encounter (Signed)
New msg Pt is scheduled for procedure next Monday and she needs to stop taking warfarin prior. Please call

## 2011-12-23 NOTE — Telephone Encounter (Signed)
Romilda Joy from Dr. Beverlee Nims office [phone] ext [phone] called, needs the nurse to return call  Concerning patient  Medication hold surgical clearance for upcoming procedure.

## 2011-12-25 NOTE — Telephone Encounter (Signed)
Spoke with Erskine Squibb at [facility]. They do not need cardiac clearance for procedure as pt is not undergoing general anesthesia. Office is asking if OK for pt to hold coumadin for 3 days prior to procedure. Will discuss with Dr. Johney Frame.

## 2011-12-25 NOTE — Telephone Encounter (Signed)
Reviewed with Dr. Johney Frame and if pt does not have history of stroke or mechanical valve she can stop Coumadin for 3 days and resume  after procedure. She will not need office visit prior to procedure. I called and spoke with pt and confirmed she does not have a history of stroke or mechanical valve. I gave her information from Dr. Johney Frame regarding coumadin and told her I would let Erskine Squibb at [facility] know. Will also cancel pt's appt with Tereso Newcomer, PA for December 30, 2011

## 2011-12-25 NOTE — Telephone Encounter (Signed)
I returned call and spoke with Erskine Squibb at office. Erskine Squibb states pt is planning to have a botox injection in bladder. She is requesting surgical clearance and is asking if pt can be cleared without being seen as she was recently seen by Dr Jens Som in June. (office visit with Tereso Newcomer, PA has previously been scheduled for clearance on December 30, 2011 per below note).  Erskine Squibb will get clarification as to how long pt needs to be off Coumadin prior to procedure and call us back with this information.  Once information received I will review with DOD

## 2011-12-25 NOTE — Telephone Encounter (Signed)
New problem:  Per Carla Curtis, upcoming procedure on 7/22 at office. botox into her bladder muscle. Cardiac clearance . Need to be office coumadin 3 days prior to procedure.

## 2011-12-25 NOTE — Telephone Encounter (Signed)
I spoke with Carla Curtis and gave her information from Dr. Johney Frame

## 2011-12-26 ENCOUNTER — Telehealth: Payer: Self-pay | Admitting: *Deleted

## 2011-12-26 NOTE — Telephone Encounter (Signed)
Talked with Toy and gave instruction that Mrs Hogge may take Bactrim DS bid for 3 days before procedure on July 22nd  as her coumadin will be stopped 3 days prior to procedure on Monday  July 22nd and read her instruction from Dr Johney Frame  that she may restart coumadin after procedure

## 2011-12-26 NOTE — Telephone Encounter (Signed)
Received call from Toy at [facility] asking if pt could be on Septra DS one bid for 3 days when coumadin is stopped for 3 days before procedure Botex injection into bladder.on July 22nd.Toy informed that nurse will check with Dr Abner Greenspan and call her  back Talked with Dr  Weston Brass  regarding above and she states would be okay for her to be on Septra DS  since Coumadin will be stopped for 3 days for  Procedure

## 2011-12-30 ENCOUNTER — Ambulatory Visit: Payer: Medicare Other | Admitting: Physician Assistant

## 2011-12-30 DIAGNOSIS — N3941 Urge incontinence: Secondary | ICD-10-CM | POA: Diagnosis not present

## 2011-12-31 DIAGNOSIS — L570 Actinic keratosis: Secondary | ICD-10-CM | POA: Diagnosis not present

## 2011-12-31 DIAGNOSIS — Z85828 Personal history of other malignant neoplasm of skin: Secondary | ICD-10-CM | POA: Diagnosis not present

## 2012-01-06 ENCOUNTER — Ambulatory Visit (INDEPENDENT_AMBULATORY_CARE_PROVIDER_SITE_OTHER): Payer: Medicare Other | Admitting: *Deleted

## 2012-01-06 DIAGNOSIS — Z7901 Long term (current) use of anticoagulants: Secondary | ICD-10-CM | POA: Diagnosis not present

## 2012-01-06 DIAGNOSIS — I4891 Unspecified atrial fibrillation: Secondary | ICD-10-CM | POA: Diagnosis not present

## 2012-01-13 DIAGNOSIS — M999 Biomechanical lesion, unspecified: Secondary | ICD-10-CM | POA: Diagnosis not present

## 2012-01-15 ENCOUNTER — Telehealth: Payer: Self-pay | Admitting: Family

## 2012-01-15 DIAGNOSIS — M8580 Other specified disorders of bone density and structure, unspecified site: Secondary | ICD-10-CM

## 2012-01-15 NOTE — Telephone Encounter (Signed)
Pls notify pt that bone density shows osteopenia.  She should continue vitamin D supplement.  Add Caltrate bid, and get regular weight bearing exercise such as walking.

## 2012-01-16 NOTE — Telephone Encounter (Signed)
Notified pt and she voices understanding. 

## 2012-01-17 ENCOUNTER — Ambulatory Visit (INDEPENDENT_AMBULATORY_CARE_PROVIDER_SITE_OTHER): Payer: Medicare Other

## 2012-01-17 DIAGNOSIS — Z7901 Long term (current) use of anticoagulants: Secondary | ICD-10-CM

## 2012-01-17 DIAGNOSIS — I4891 Unspecified atrial fibrillation: Secondary | ICD-10-CM | POA: Diagnosis not present

## 2012-01-22 DIAGNOSIS — N3941 Urge incontinence: Secondary | ICD-10-CM | POA: Diagnosis not present

## 2012-01-22 DIAGNOSIS — N390 Urinary tract infection, site not specified: Secondary | ICD-10-CM | POA: Diagnosis not present

## 2012-01-31 ENCOUNTER — Other Ambulatory Visit: Payer: Self-pay | Admitting: Family

## 2012-01-31 DIAGNOSIS — Z139 Encounter for screening, unspecified: Secondary | ICD-10-CM

## 2012-01-31 MED ORDER — VITAMIN D (ERGOCALCIFEROL) 1.25 MG (50000 UNIT) PO CAPS: 50000.0000 [IU] | ORAL_CAPSULE | ORAL | Status: DC

## 2012-02-03 ENCOUNTER — Ambulatory Visit (HOSPITAL_BASED_OUTPATIENT_CLINIC_OR_DEPARTMENT_OTHER)
Admission: RE | Admit: 2012-02-03 | Discharge: 2012-02-03 | Disposition: A | Discharge status: Home / Self Care | Payer: Medicare Other | Source: Ambulatory Visit | Attending: Family | Admitting: Family

## 2012-02-03 DIAGNOSIS — R922 Inconclusive mammogram: Secondary | ICD-10-CM | POA: Insufficient documentation

## 2012-02-03 DIAGNOSIS — Z139 Encounter for screening, unspecified: Secondary | ICD-10-CM

## 2012-02-03 DIAGNOSIS — Z1231 Encounter for screening mammogram for malignant neoplasm of breast: Secondary | ICD-10-CM | POA: Diagnosis not present

## 2012-02-05 ENCOUNTER — Telehealth: Payer: Self-pay | Admitting: Family

## 2012-02-05 NOTE — Telephone Encounter (Signed)
Left message for pt to return my call.

## 2012-02-06 ENCOUNTER — Other Ambulatory Visit: Payer: Self-pay | Admitting: Family

## 2012-02-06 DIAGNOSIS — R928 Other abnormal and inconclusive findings on diagnostic imaging of breast: Secondary | ICD-10-CM

## 2012-02-07 NOTE — Telephone Encounter (Signed)
Reviewed mammogram results.  Abnormality noted in right breast.  Needs additional imaging.  Left message for pt to return our call.

## 2012-02-11 DIAGNOSIS — M999 Biomechanical lesion, unspecified: Secondary | ICD-10-CM | POA: Diagnosis not present

## 2012-02-11 NOTE — Telephone Encounter (Signed)
Spoke with pt. She has diagnostic mammogram scheduled for tomorrow at 1:50pm.

## 2012-02-12 ENCOUNTER — Ambulatory Visit
Admission: RE | Admit: 2012-02-12 | Discharge: 2012-02-12 | Disposition: A | Discharge status: Home / Self Care | Payer: Medicare Other | Source: Ambulatory Visit | Attending: Family | Admitting: Family

## 2012-02-12 DIAGNOSIS — R928 Other abnormal and inconclusive findings on diagnostic imaging of breast: Secondary | ICD-10-CM

## 2012-02-14 ENCOUNTER — Ambulatory Visit (INDEPENDENT_AMBULATORY_CARE_PROVIDER_SITE_OTHER): Payer: Medicare Other | Admitting: *Deleted

## 2012-02-14 DIAGNOSIS — Z7901 Long term (current) use of anticoagulants: Secondary | ICD-10-CM

## 2012-02-14 DIAGNOSIS — I4891 Unspecified atrial fibrillation: Secondary | ICD-10-CM | POA: Diagnosis not present

## 2012-02-14 LAB — POCT INR: INR: 2.5

## 2012-02-23 ENCOUNTER — Other Ambulatory Visit: Payer: Self-pay | Admitting: Cardiology

## 2012-03-02 ENCOUNTER — Encounter: Payer: Self-pay | Admitting: Gastroenterology

## 2012-03-05 ENCOUNTER — Telehealth: Payer: Self-pay | Admitting: Gastroenterology

## 2012-03-06 DIAGNOSIS — E559 Vitamin D deficiency, unspecified: Secondary | ICD-10-CM | POA: Diagnosis not present

## 2012-03-06 NOTE — Telephone Encounter (Signed)
Lab orders released/SLS 

## 2012-03-06 NOTE — Addendum Note (Signed)
Addended by: Regis Bill on: 03/06/2012 03:12 PM   Modules accepted: Orders

## 2012-03-07 LAB — VITAMIN D 25 HYDROXY (VIT D DEFICIENCY, FRACTURES): Vit D, 25-Hydroxy: 42 ng/mL (ref 30–89)

## 2012-03-08 NOTE — Telephone Encounter (Signed)
ok 

## 2012-03-09 ENCOUNTER — Telehealth: Payer: Self-pay | Admitting: Family

## 2012-03-09 NOTE — Telephone Encounter (Signed)
Vitamin D level is now normal.  She can stop once weekly supplement and start otc vitamin D as below.

## 2012-03-10 NOTE — Telephone Encounter (Signed)
Left message on home # to return my call. 

## 2012-03-11 NOTE — Telephone Encounter (Signed)
Left detailed message on home # re: instructions and to call if any questions. 

## 2012-03-25 ENCOUNTER — Ambulatory Visit (INDEPENDENT_AMBULATORY_CARE_PROVIDER_SITE_OTHER): Payer: Medicare Other | Admitting: *Deleted

## 2012-03-25 DIAGNOSIS — I4891 Unspecified atrial fibrillation: Secondary | ICD-10-CM

## 2012-03-25 DIAGNOSIS — Z7901 Long term (current) use of anticoagulants: Secondary | ICD-10-CM

## 2012-05-05 ENCOUNTER — Ambulatory Visit (INDEPENDENT_AMBULATORY_CARE_PROVIDER_SITE_OTHER): Payer: Medicare Other | Admitting: *Deleted

## 2012-05-05 DIAGNOSIS — I4891 Unspecified atrial fibrillation: Secondary | ICD-10-CM

## 2012-05-05 DIAGNOSIS — Z7901 Long term (current) use of anticoagulants: Secondary | ICD-10-CM

## 2012-05-09 ENCOUNTER — Other Ambulatory Visit: Payer: Self-pay | Admitting: Cardiology

## 2012-05-11 ENCOUNTER — Other Ambulatory Visit: Payer: Self-pay | Admitting: *Deleted

## 2012-05-13 DIAGNOSIS — Z23 Encounter for immunization: Secondary | ICD-10-CM | POA: Diagnosis not present

## 2012-05-25 DIAGNOSIS — N390 Urinary tract infection, site not specified: Secondary | ICD-10-CM | POA: Diagnosis not present

## 2012-05-25 DIAGNOSIS — N3941 Urge incontinence: Secondary | ICD-10-CM | POA: Diagnosis not present

## 2012-06-16 ENCOUNTER — Ambulatory Visit (INDEPENDENT_AMBULATORY_CARE_PROVIDER_SITE_OTHER): Payer: Medicare Other | Admitting: Family

## 2012-06-16 ENCOUNTER — Encounter: Payer: Self-pay | Admitting: Family

## 2012-06-16 VITALS — BP 110/70 | HR 57 | Temp 97.8°F | Resp 16 | Ht 65.5 in | Wt 175.0 lb

## 2012-06-16 DIAGNOSIS — I10 Essential (primary) hypertension: Secondary | ICD-10-CM | POA: Diagnosis not present

## 2012-06-16 DIAGNOSIS — I509 Heart failure, unspecified: Secondary | ICD-10-CM

## 2012-06-16 DIAGNOSIS — M899 Disorder of bone, unspecified: Secondary | ICD-10-CM

## 2012-06-16 DIAGNOSIS — F4321 Adjustment disorder with depressed mood: Secondary | ICD-10-CM | POA: Insufficient documentation

## 2012-06-16 DIAGNOSIS — F4320 Adjustment disorder, unspecified: Secondary | ICD-10-CM | POA: Insufficient documentation

## 2012-06-16 DIAGNOSIS — M949 Disorder of cartilage, unspecified: Secondary | ICD-10-CM | POA: Diagnosis not present

## 2012-06-16 LAB — BASIC METABOLIC PANEL
BUN: 14 mg/dL (ref 6–23)
Glucose, Bld: 98 mg/dL (ref 70–99)
Potassium: 4.1 mEq/L (ref 3.5–5.3)

## 2012-06-16 MED ORDER — ALENDRONATE SODIUM 70 MG PO TABS: 70.0000 mg | ORAL_TABLET | ORAL | Status: DC

## 2012-06-16 NOTE — Assessment & Plan Note (Signed)
Normal grief reaction.  I advised pt to let me know if she does not start to feel better now that the holidays are over.

## 2012-06-16 NOTE — Progress Notes (Signed)
Subjective:    Patient ID: Carla Curtis, female    DOB: 08/06/1932, 79 y.o.   MRN: [id number]  HPI  Carla Curtis is a 79 yr old female who presents today for follow up.  She reports that the holidays have been "up and down."  This is the first Christmas since she passed away.  She reports that "I am not depressed, just feel blah." Grandson just went back to [place].  CHF- She denies significant swelling of legs.  Denies chest pain.  Notes DOE with fast walking- but reports that this is at baseline.  AF- She continues to follow with Dr. Jens Som and the coumadin clinic.   She had flu shot at [facility] 12/13.    Review of Systems see HPI    Past Medical History  Diagnosis Date  . Normal cardiac stress test 01/27/09    lvef 73% done at [facility] (nuclear stress)  . Atrial fibrillation     on Coumadin since mid 2010  . HTN (hypertension)   . Incontinence   . Vitamin D deficiency 12/13/2011    History   Social History  . Marital Status: Widowed    Spouse Name: N/A    Number of Children: 2  . Years of Education: N/A   Occupational History  .     Social History Main Topics  . Smoking status: Never Smoker   . Smokeless tobacco: Never Used  . Alcohol Use: Yes     Comment: rare- 2 drinks/ week  . Drug Use: Not on file  . Sexually Active: Not on file   Other Topics Concern  . Not on file   Social History Narrative   Regular Exercise- no    Past Surgical History  Procedure Date  . Laminectomy 1989 and 1990    L4, L5  . Bilateral mammoplasty   . Tonsillectomy and adenoidectomy 1941  . Abdominal hysterectomy 2007  . Arm skin lesion biopsy / excision     pre-cancerous    Family History  Problem Relation Age of Onset  . Hypertension Mother   . COPD Father     smoker  . Cancer Father     lung  . Cancer Sister     breast  . Bell's palsy Sister   . Heart disease Sister     CHF  . Cancer Daughter     breast/ stage 1- s/p lumpectomy and radiation  . Cancer Other       Hodgkins Disease    No Known Allergies  Current Outpatient Prescriptions on File Prior to Visit  Medication Sig Dispense Refill  . cholecalciferol (VITAMIN D) 1000 UNITS tablet Take 3,000 Units by mouth daily.      Marland Kitchen co-enzyme Q-10 50 MG capsule Take 100 mg by mouth daily.      . Cranberry 250 MG CAPS Take 2 capsules by mouth every morning.        . diltiazem (CARDIZEM) 120 MG tablet TAKE 1 TABLET BY MOUTH EVERY DAY  30 tablet  10  . fish oil-omega-3 fatty acids 1000 MG capsule Take 1 g by mouth daily.      . furosemide (LASIX) 40 MG tablet Take 1/2 tablet by mouth as needed.  15 tablet  1  . mirabegron ER (MYRBETRIQ) 25 MG TB24 Take 50 mg by mouth daily.       . NON FORMULARY Red yeast rice 1200 mg      . NON FORMULARY D3 5000 IU as directed      .  NON FORMULARY Collagen as directed      . warfarin (COUMADIN) 5 MG tablet Take as directed by coumadin clinic  30 tablet  3  . Calcium Carbonate-Vitamin D (CALTRATE 600+D) 600-400 MG-UNIT per tablet Take 1 tablet by mouth 2 (two) times daily.        BP 110/70  Pulse 57  Temp 97.8 F (36.6 C) (Oral)  Resp 16  Ht 5' 5.5" (1.664 m)  Wt 175 lb (79.379 kg)  BMI 28.68 kg/m2  SpO2 97%    Objective:   Physical Exam  Constitutional: She is oriented to person, place, and time. She appears well-developed and well-nourished. No distress.  HENT:  Head: Normocephalic and atraumatic.  Cardiovascular: Normal rate and regular rhythm.   No murmur heard. Pulmonary/Chest: Effort normal and breath sounds normal. No respiratory distress. She has no wheezes. She has no rales. She exhibits no tenderness.  Musculoskeletal:       2+ bilateral LE edema  Neurological: She is alert and oriented to person, place, and time.  Skin: Skin is warm and dry. No rash noted. No erythema. No pallor.  Psychiatric: She has a normal mood and affect. Her behavior is normal. Judgment and thought content normal.          Assessment & Plan:

## 2012-06-16 NOTE — Assessment & Plan Note (Addendum)
BP Readings from Last 3 Encounters:  06/16/12 110/70  12/09/11 116/66  11/27/11 110/80   BP stable, continue cardizem/lasix.  Obtain bmet.

## 2012-06-16 NOTE — Assessment & Plan Note (Signed)
Recommended that she continue caltrate and add fosamax once weekly.

## 2012-06-16 NOTE — Assessment & Plan Note (Signed)
Clinically euvolemic.  Continue furosemide at current dosing.

## 2012-06-16 NOTE — Patient Instructions (Addendum)
Please follow up in July for Medicare wellness visit.

## 2012-06-17 ENCOUNTER — Ambulatory Visit (INDEPENDENT_AMBULATORY_CARE_PROVIDER_SITE_OTHER): Payer: Medicare Other | Admitting: *Deleted

## 2012-06-17 ENCOUNTER — Encounter: Payer: Self-pay | Admitting: Family

## 2012-06-17 DIAGNOSIS — Z7901 Long term (current) use of anticoagulants: Secondary | ICD-10-CM

## 2012-06-17 DIAGNOSIS — I4891 Unspecified atrial fibrillation: Secondary | ICD-10-CM

## 2012-06-17 LAB — POCT INR: INR: 2.5

## 2012-07-29 ENCOUNTER — Ambulatory Visit (INDEPENDENT_AMBULATORY_CARE_PROVIDER_SITE_OTHER): Payer: Medicare Other | Admitting: *Deleted

## 2012-07-29 DIAGNOSIS — Z7901 Long term (current) use of anticoagulants: Secondary | ICD-10-CM | POA: Diagnosis not present

## 2012-09-28 ENCOUNTER — Encounter: Payer: Self-pay | Admitting: Family

## 2012-09-28 ENCOUNTER — Ambulatory Visit (INDEPENDENT_AMBULATORY_CARE_PROVIDER_SITE_OTHER): Payer: Medicare Other | Admitting: Family

## 2012-09-28 ENCOUNTER — Encounter: Payer: Self-pay | Admitting: *Deleted

## 2012-09-28 ENCOUNTER — Other Ambulatory Visit: Payer: Self-pay | Admitting: Cardiology

## 2012-09-28 ENCOUNTER — Telehealth: Payer: Self-pay | Admitting: *Deleted

## 2012-09-28 VITALS — BP 120/70 | HR 57 | Temp 97.4°F | Resp 16 | Ht 65.5 in | Wt 173.1 lb

## 2012-09-28 DIAGNOSIS — I10 Essential (primary) hypertension: Secondary | ICD-10-CM | POA: Diagnosis not present

## 2012-09-28 DIAGNOSIS — M949 Disorder of cartilage, unspecified: Secondary | ICD-10-CM | POA: Diagnosis not present

## 2012-09-28 DIAGNOSIS — M545 Low back pain: Secondary | ICD-10-CM | POA: Insufficient documentation

## 2012-09-28 DIAGNOSIS — L989 Disorder of the skin and subcutaneous tissue, unspecified: Secondary | ICD-10-CM | POA: Diagnosis not present

## 2012-09-28 DIAGNOSIS — M79605 Pain in left leg: Secondary | ICD-10-CM

## 2012-09-28 DIAGNOSIS — M899 Disorder of bone, unspecified: Secondary | ICD-10-CM | POA: Diagnosis not present

## 2012-09-28 MED ORDER — CYCLOBENZAPRINE HCL 5 MG PO TABS: 5.0000 mg | ORAL_TABLET | Freq: Three times a day (TID) | ORAL | Status: DC | PRN

## 2012-09-28 MED ORDER — METHYLPREDNISOLONE 4 MG PO KIT: PACK | ORAL | Status: DC

## 2012-09-28 NOTE — Patient Instructions (Addendum)
Please call if symptoms worsen or if not resolved in 1 week.  Schedule mole removal.

## 2012-09-28 NOTE — Assessment & Plan Note (Signed)
Will plan removal of two lesions from back next visit.

## 2012-09-28 NOTE — Telephone Encounter (Signed)
No further instructions

## 2012-09-28 NOTE — Telephone Encounter (Signed)
Received voicemail from [place] at [employer] stating [employer] will not cover cyclobenzaprine but prefers baclofen or tizanidine. She states that pt pd out of pocket for cyclobenzaprine today.  Please advise if there are further instructions.

## 2012-09-28 NOTE — Progress Notes (Signed)
Subjective:    Patient ID: Carla Curtis, female    DOB: 1933-03-09, 79 y.o.   MRN: [id number]  HPI  Carla Curtis is a 79 yr old female who presents today with several concerns:  Groin Pain Pt reports left groin pain that radiates to her left leg and lower back x 1 week. Has noted some swelling around the lower part of her spine. Has had increased pain interfering wiht activities since the weekend. Reports that she recently started curves and was doing some recent yard work.  Pain started in the left upper leg.  Reports surges of pain, also at the left buttock.    Nevus Reports mole in the middle of her back that she would like assessed.   Osteopenia- she stopped fosamax due to risk of femur fracture. She is now getting regular exercise.  Review of Systems See HPI  Past Medical History  Diagnosis Date  . Normal cardiac stress test 01/27/09    lvef 73% done at [facility] (nuclear stress)  . Atrial fibrillation     on Coumadin since mid 2010  . HTN (hypertension)   . Incontinence   . Vitamin D deficiency 12/13/2011    History   Social History  . Marital Status: Widowed    Spouse Name: N/A    Number of Children: 2  . Years of Education: N/A   Occupational History  .     Social History Main Topics  . Smoking status: Never Smoker   . Smokeless tobacco: Never Used  . Alcohol Use: Yes     Comment: rare- 2 drinks/ week  . Drug Use: Not on file  . Sexually Active: Not on file   Other Topics Concern  . Not on file   Social History Narrative   Regular Exercise- no    Past Surgical History  Procedure Laterality Date  . Laminectomy  1989 and 1990    L4, L5  . Bilateral mammoplasty    . Tonsillectomy and adenoidectomy  1941  . Abdominal hysterectomy  2007  . Arm skin lesion biopsy / excision      pre-cancerous    Family History  Problem Relation Age of Onset  . Hypertension Mother   . COPD Father     smoker  . Cancer Father     lung  . Cancer Sister     breast  .  Bell's palsy Sister   . Heart disease Sister     CHF  . Cancer Daughter     breast/ stage 1- s/p lumpectomy and radiation  . Cancer Other     Hodgkins Disease    No Known Allergies  Current Outpatient Prescriptions on File Prior to Visit  Medication Sig Dispense Refill  . alendronate (FOSAMAX) 70 MG tablet Take 1 tablet (70 mg total) by mouth every 7 (seven) days. Take with a full glass of water on an empty stomach.  12 tablet  3  . cholecalciferol (VITAMIN D) 1000 UNITS tablet Take 3,000 Units by mouth daily.      Marland Kitchen co-enzyme Q-10 50 MG capsule Take 100 mg by mouth daily.      . Cranberry 250 MG CAPS Take 2 capsules by mouth every morning.        . diltiazem (CARDIZEM) 120 MG tablet TAKE 1 TABLET BY MOUTH EVERY DAY  30 tablet  10  . fish oil-omega-3 fatty acids 1000 MG capsule Take 1 g by mouth daily.      . furosemide (  LASIX) 40 MG tablet Take 1/2 tablet by mouth as needed.  15 tablet  1  . mirabegron ER (MYRBETRIQ) 25 MG TB24 Take 50 mg by mouth daily.       . NON FORMULARY D3 5000 IU as directed      . NON FORMULARY Collagen as directed      . warfarin (COUMADIN) 5 MG tablet Take as directed by coumadin clinic  30 tablet  3   No current facility-administered medications on file prior to visit.    BP 120/70  Pulse 57  Temp(Src) 97.4 F (36.3 C) (Oral)  Resp 16  Ht 5' 5.5" (1.664 m)  Wt 173 lb 1.3 oz (78.509 kg)  BMI 28.35 kg/m2  SpO2 97%       Objective:   Physical Exam  Constitutional: She is oriented to person, place, and time. She appears well-developed and well-nourished. No distress.  Cardiovascular: Normal rate and regular rhythm.   No murmur heard. Pulmonary/Chest: Effort normal and breath sounds normal. No respiratory distress. She has no wheezes. She has no rales. She exhibits no tenderness.  Musculoskeletal:       Thoracic back: She exhibits no tenderness and no swelling.       Lumbar back: She exhibits no tenderness and no swelling.  Bilateral LE  strength is 5/5.   Neurological: She is alert and oriented to person, place, and time.  Reflex Scores:      Patellar reflexes are 1+ on the right side and 1+ on the left side. Skin:  2 raised, dry, hyperpigmented lesions noted on back.  Psychiatric: She has a normal mood and affect. Her behavior is normal. Judgment and thought content normal.          Assessment & Plan:

## 2012-09-28 NOTE — Assessment & Plan Note (Signed)
Declines fosamax at this time due to increased risk of atypical femur fracture.

## 2012-09-28 NOTE — Assessment & Plan Note (Addendum)
Trial of medrol dose pak and prn flexeril- she is instructed not to drive after taking flexeril. If no improvement, plan further imaging of her spine give hx of previous lumbar surgeries.

## 2012-09-29 ENCOUNTER — Ambulatory Visit: Payer: Medicare Other | Admitting: Family

## 2012-10-01 ENCOUNTER — Ambulatory Visit (INDEPENDENT_AMBULATORY_CARE_PROVIDER_SITE_OTHER): Payer: Medicare Other | Admitting: *Deleted

## 2012-10-01 DIAGNOSIS — I4891 Unspecified atrial fibrillation: Secondary | ICD-10-CM | POA: Diagnosis not present

## 2012-10-01 DIAGNOSIS — Z7901 Long term (current) use of anticoagulants: Secondary | ICD-10-CM | POA: Diagnosis not present

## 2012-10-04 ENCOUNTER — Emergency Department (HOSPITAL_BASED_OUTPATIENT_CLINIC_OR_DEPARTMENT_OTHER)
Admission: EM | Admit: 2012-10-04 | Discharge: 2012-10-04 | Disposition: A | Discharge status: Home / Self Care | Payer: Medicare Other | Attending: Emergency Medicine | Admitting: Emergency Medicine

## 2012-10-04 ENCOUNTER — Encounter (HOSPITAL_BASED_OUTPATIENT_CLINIC_OR_DEPARTMENT_OTHER): Payer: Self-pay | Admitting: Emergency Medicine

## 2012-10-04 DIAGNOSIS — IMO0002 Reserved for concepts with insufficient information to code with codable children: Secondary | ICD-10-CM | POA: Diagnosis not present

## 2012-10-04 DIAGNOSIS — E559 Vitamin D deficiency, unspecified: Secondary | ICD-10-CM | POA: Insufficient documentation

## 2012-10-04 DIAGNOSIS — I10 Essential (primary) hypertension: Secondary | ICD-10-CM | POA: Diagnosis not present

## 2012-10-04 DIAGNOSIS — M5416 Radiculopathy, lumbar region: Secondary | ICD-10-CM

## 2012-10-04 DIAGNOSIS — I4891 Unspecified atrial fibrillation: Secondary | ICD-10-CM | POA: Insufficient documentation

## 2012-10-04 DIAGNOSIS — Z7901 Long term (current) use of anticoagulants: Secondary | ICD-10-CM | POA: Insufficient documentation

## 2012-10-04 DIAGNOSIS — Z79899 Other long term (current) drug therapy: Secondary | ICD-10-CM | POA: Insufficient documentation

## 2012-10-04 MED ORDER — HYDROMORPHONE HCL PF 1 MG/ML IJ SOLN
1.0000 mg | Freq: Once | INTRAMUSCULAR | Status: AC
  Administered 2012-10-04: 1 mg via INTRAMUSCULAR
  Filled 2012-10-04: qty 1

## 2012-10-04 MED ORDER — HYDROCODONE-ACETAMINOPHEN 5-325 MG PO TABS: 1.0000 | ORAL_TABLET | Freq: Four times a day (QID) | ORAL | Status: DC | PRN

## 2012-10-04 NOTE — ED Notes (Signed)
Pt having left leg pain, lower back pain for over two weeks.  Has seen Dr. Peggyann Juba, and treated.  Pain has worsened as steroids have tapered off.  No difficulty with urinating.  No numbness in legs, some in arms.

## 2012-10-04 NOTE — ED Provider Notes (Signed)
History     CSN: [id number]  Arrival date & time 10/04/12  0915   First MD Initiated Contact with Patient 10/04/12 1000      Chief Complaint  Patient presents with  . Leg Pain  . Back Pain    (Consider location/radiation/quality/duration/timing/severity/associated sxs/prior treatment) Patient is a 79 y.o. female presenting with leg pain and back pain.  Leg Pain Associated symptoms: back pain   Back Pain Associated symptoms: leg pain    Pt with remote history of laminectomy x 2 reports about a week of L lower back pain, radiating into her L buttock and L leg. Worse at night, not associated with bowel or bladder incontinence, no fever. She is able to walk without weakness. She was seen for same by PCP about a week ago and given Medrol Dosepak which helped at first, but pain returned when she tapered off. She was told by PCP she would need imaging if symptoms persisted. Pt denies any recent falls or injury.   Past Medical History  Diagnosis Date  . Normal cardiac stress test 01/27/09    lvef 73% done at [facility] (nuclear stress)  . Atrial fibrillation     on Coumadin since mid 2010  . HTN (hypertension)   . Incontinence   . Vitamin D deficiency 12/13/2011    Past Surgical History  Procedure Laterality Date  . Laminectomy  1989 and 1990    L4, L5  . Bilateral mammoplasty    . Tonsillectomy and adenoidectomy  1941  . Abdominal hysterectomy  2007  . Arm skin lesion biopsy / excision      pre-cancerous    Family History  Problem Relation Age of Onset  . Hypertension Mother   . COPD Father     smoker  . Cancer Father     lung  . Cancer Sister     breast  . Bell's palsy Sister   . Heart disease Sister     CHF  . Cancer Daughter     breast/ stage 1- s/p lumpectomy and radiation  . Cancer Other     Hodgkins Disease    History  Substance Use Topics  . Smoking status: Never Smoker   . Smokeless tobacco: Never Used  . Alcohol Use: Yes     Comment: rare- 2 drinks/  week    OB History   Grav Para Term Preterm Abortions TAB SAB Ect Mult Living                  Review of Systems  Musculoskeletal: Positive for back pain.   All other systems reviewed and are negative except as noted in HPI.   Allergies  Review of patient's allergies indicates no known allergies.  Home Medications   Current Outpatient Rx  Name  Route  Sig  Dispense  Refill  . cholecalciferol (VITAMIN D) 1000 UNITS tablet   Oral   Take 3,000 Units by mouth daily.         . Cranberry 250 MG CAPS   Oral   Take 2 capsules by mouth every morning.           . diltiazem (CARDIZEM) 120 MG tablet      TAKE 1 TABLET BY MOUTH EVERY DAY   30 tablet   10   . fish oil-omega-3 fatty acids 1000 MG capsule   Oral   Take 1 g by mouth daily.         . furosemide (LASIX) 40 MG tablet  Take 1/2 tablet by mouth as needed.   15 tablet   1   . methylPREDNISolone (MEDROL DOSEPAK) 4 MG tablet      follow package directions   21 tablet   0   . mirabegron ER (MYRBETRIQ) 25 MG TB24   Oral   Take 50 mg by mouth daily.          . multivitamin-iron-minerals-folic acid (CENTRUM) chewable tablet   Oral   Chew 1 tablet by mouth daily.         . NON FORMULARY      D3 5000 IU as directed         . NON FORMULARY      Collagen as directed         . vitamin B-12 (CYANOCOBALAMIN) 1000 MCG tablet   Oral   Take 1,000 mcg by mouth daily.         Marland Kitchen warfarin (COUMADIN) 5 MG tablet      Take as directed by coumadin clinic   30 tablet   3   . cyclobenzaprine (FLEXERIL) 5 MG tablet   Oral   Take 1 tablet (5 mg total) by mouth 3 (three) times daily as needed for muscle spasms.   20 tablet   0     BP 111/58  Pulse 77  Temp(Src) 97.6 F (36.4 C) (Oral)  Resp 18  Ht 5' 5.5" (1.664 m)  Wt 170 lb (77.111 kg)  BMI 27.85 kg/m2  SpO2 99%  Physical Exam  Nursing note and vitals reviewed. Constitutional: She is oriented to person, place, and time. She appears  well-developed and well-nourished.  HENT:  Head: Normocephalic and atraumatic.  Eyes: EOM are normal. Pupils are equal, round, and reactive to light.  Neck: Normal range of motion. Neck supple.  Cardiovascular: Normal rate, normal heart sounds and intact distal pulses.   Pulmonary/Chest: Effort normal and breath sounds normal.  Abdominal: Bowel sounds are normal. She exhibits no distension. There is no tenderness.  Musculoskeletal: Normal range of motion. She exhibits tenderness (L lumbar paraspinal muscles, L sciatic notch). She exhibits no edema.  Neurological: She is alert and oriented to person, place, and time. She has normal strength. She displays normal reflexes. No cranial nerve deficit or sensory deficit.  Skin: Skin is warm and dry. No rash noted.  Psychiatric: She has a normal mood and affect.    ED Course  Procedures (including critical care time)  Labs Reviewed - No data to display No results found.   No diagnosis found.    MDM  Lumbar radiculopathy without red flags for cord compression. Recently finished steroids, will hold off on repeating that course. Pain medications for comfort, advised close PCP followup for outpatient imaging. MRI not available here today.   10:51 AM Pain improved. Pt resting in bed. Advised PCP follow-up. Pain meds involved.       Charles B. Bernette Mayers, MD 10/04/12 1051

## 2012-10-05 ENCOUNTER — Telehealth: Payer: Self-pay | Admitting: Family

## 2012-10-05 DIAGNOSIS — M5410 Radiculopathy, site unspecified: Secondary | ICD-10-CM

## 2012-10-05 NOTE — Telephone Encounter (Signed)
Caller: Serenity/Patient; Phone: [phone]; Reason for Call: Pt was seen in the office 09/28/12 for leg/back pain.  Pt was also seen in the ER on 10/04/12.  ER said pt needed MRI.  Pt is calling to get MRI scheduled today.  OFFICE PLEASE FOLLOW UP WITH PT

## 2012-10-05 NOTE — Telephone Encounter (Signed)
Sandford Craze, NP at 10/05/2012 11:20 AM   Status: Signed            MRI order has been placed. Pt notified

## 2012-10-05 NOTE — Telephone Encounter (Signed)
MRI order has been placed.  Pt notified.

## 2012-10-05 NOTE — Telephone Encounter (Signed)
She went to emergency yesterday and she feels no better and wants the MRI.  Emergency felt she should have it today.  Please advise

## 2012-10-05 NOTE — Telephone Encounter (Signed)
Please advise/SLS 

## 2012-10-06 ENCOUNTER — Ambulatory Visit (HOSPITAL_BASED_OUTPATIENT_CLINIC_OR_DEPARTMENT_OTHER)
Admission: RE | Admit: 2012-10-06 | Discharge: 2012-10-06 | Disposition: A | Discharge status: Home / Self Care | Payer: Medicare Other | Source: Ambulatory Visit | Attending: Family | Admitting: Family

## 2012-10-06 DIAGNOSIS — M79609 Pain in unspecified limb: Secondary | ICD-10-CM | POA: Diagnosis not present

## 2012-10-06 DIAGNOSIS — M479 Spondylosis, unspecified: Secondary | ICD-10-CM | POA: Diagnosis not present

## 2012-10-06 DIAGNOSIS — IMO0002 Reserved for concepts with insufficient information to code with codable children: Secondary | ICD-10-CM | POA: Diagnosis not present

## 2012-10-06 DIAGNOSIS — M5124 Other intervertebral disc displacement, thoracic region: Secondary | ICD-10-CM | POA: Diagnosis not present

## 2012-10-06 DIAGNOSIS — M25559 Pain in unspecified hip: Secondary | ICD-10-CM | POA: Diagnosis not present

## 2012-10-06 DIAGNOSIS — M5137 Other intervertebral disc degeneration, lumbosacral region: Secondary | ICD-10-CM | POA: Diagnosis not present

## 2012-10-06 DIAGNOSIS — N949 Unspecified condition associated with female genital organs and menstrual cycle: Secondary | ICD-10-CM | POA: Diagnosis not present

## 2012-10-06 DIAGNOSIS — M5410 Radiculopathy, site unspecified: Secondary | ICD-10-CM

## 2012-10-06 DIAGNOSIS — M545 Low back pain, unspecified: Secondary | ICD-10-CM | POA: Diagnosis not present

## 2012-10-06 DIAGNOSIS — M47817 Spondylosis without myelopathy or radiculopathy, lumbosacral region: Secondary | ICD-10-CM | POA: Diagnosis not present

## 2012-10-06 DIAGNOSIS — M51379 Other intervertebral disc degeneration, lumbosacral region without mention of lumbar back pain or lower extremity pain: Secondary | ICD-10-CM | POA: Insufficient documentation

## 2012-10-07 ENCOUNTER — Telehealth: Payer: Self-pay | Admitting: Family

## 2012-10-07 DIAGNOSIS — M545 Low back pain: Secondary | ICD-10-CM

## 2012-10-07 NOTE — Telephone Encounter (Signed)
Patient notified of results and is agreeable to referral to physical therapy.

## 2012-10-07 NOTE — Telephone Encounter (Signed)
Pls call pt and let her know that I have reviewed her MRI. It shows arthritis and degenerative disc disease.  At this point, I would recommend referral for physical therapy for her back pain.  Pended below.

## 2012-10-14 ENCOUNTER — Ambulatory Visit: Payer: Medicare Other | Attending: Family | Admitting: Rehabilitation

## 2012-10-14 DIAGNOSIS — M545 Low back pain, unspecified: Secondary | ICD-10-CM | POA: Insufficient documentation

## 2012-10-14 DIAGNOSIS — M25559 Pain in unspecified hip: Secondary | ICD-10-CM | POA: Diagnosis not present

## 2012-10-14 DIAGNOSIS — IMO0001 Reserved for inherently not codable concepts without codable children: Secondary | ICD-10-CM | POA: Insufficient documentation

## 2012-10-16 ENCOUNTER — Ambulatory Visit: Payer: Medicare Other | Admitting: Rehabilitation

## 2012-10-16 DIAGNOSIS — M25559 Pain in unspecified hip: Secondary | ICD-10-CM | POA: Diagnosis not present

## 2012-10-16 DIAGNOSIS — IMO0001 Reserved for inherently not codable concepts without codable children: Secondary | ICD-10-CM | POA: Diagnosis not present

## 2012-10-16 DIAGNOSIS — M545 Low back pain: Secondary | ICD-10-CM | POA: Diagnosis not present

## 2012-10-20 ENCOUNTER — Ambulatory Visit: Payer: Medicare Other | Admitting: Rehabilitation

## 2012-10-20 DIAGNOSIS — M545 Low back pain: Secondary | ICD-10-CM | POA: Diagnosis not present

## 2012-10-20 DIAGNOSIS — M25559 Pain in unspecified hip: Secondary | ICD-10-CM | POA: Diagnosis not present

## 2012-10-20 DIAGNOSIS — IMO0001 Reserved for inherently not codable concepts without codable children: Secondary | ICD-10-CM | POA: Diagnosis not present

## 2012-10-23 ENCOUNTER — Ambulatory Visit: Payer: Medicare Other | Admitting: Rehabilitation

## 2012-10-23 DIAGNOSIS — M25559 Pain in unspecified hip: Secondary | ICD-10-CM | POA: Diagnosis not present

## 2012-10-23 DIAGNOSIS — M545 Low back pain: Secondary | ICD-10-CM | POA: Diagnosis not present

## 2012-10-23 DIAGNOSIS — IMO0001 Reserved for inherently not codable concepts without codable children: Secondary | ICD-10-CM | POA: Diagnosis not present

## 2012-10-27 ENCOUNTER — Ambulatory Visit: Payer: Medicare Other | Admitting: Rehabilitation

## 2012-10-27 DIAGNOSIS — IMO0001 Reserved for inherently not codable concepts without codable children: Secondary | ICD-10-CM | POA: Diagnosis not present

## 2012-10-27 DIAGNOSIS — M545 Low back pain: Secondary | ICD-10-CM | POA: Diagnosis not present

## 2012-10-27 DIAGNOSIS — M25559 Pain in unspecified hip: Secondary | ICD-10-CM | POA: Diagnosis not present

## 2012-10-30 ENCOUNTER — Other Ambulatory Visit: Payer: Self-pay | Admitting: *Deleted

## 2012-10-30 ENCOUNTER — Ambulatory Visit: Payer: Medicare Other | Admitting: Rehabilitation

## 2012-10-30 DIAGNOSIS — IMO0001 Reserved for inherently not codable concepts without codable children: Secondary | ICD-10-CM | POA: Diagnosis not present

## 2012-10-30 DIAGNOSIS — M25559 Pain in unspecified hip: Secondary | ICD-10-CM | POA: Diagnosis not present

## 2012-10-30 DIAGNOSIS — M545 Low back pain: Secondary | ICD-10-CM | POA: Diagnosis not present

## 2012-10-30 MED ORDER — DILTIAZEM HCL 120 MG PO TABS: ORAL_TABLET | ORAL | Status: DC

## 2012-11-03 ENCOUNTER — Ambulatory Visit: Payer: Medicare Other | Admitting: Rehabilitation

## 2012-11-03 ENCOUNTER — Other Ambulatory Visit: Payer: Self-pay | Admitting: *Deleted

## 2012-11-03 DIAGNOSIS — IMO0001 Reserved for inherently not codable concepts without codable children: Secondary | ICD-10-CM | POA: Diagnosis not present

## 2012-11-03 DIAGNOSIS — M545 Low back pain: Secondary | ICD-10-CM | POA: Diagnosis not present

## 2012-11-03 DIAGNOSIS — M25559 Pain in unspecified hip: Secondary | ICD-10-CM | POA: Diagnosis not present

## 2012-11-03 MED ORDER — WARFARIN SODIUM 5 MG PO TABS: ORAL_TABLET | ORAL | Status: DC

## 2012-11-04 ENCOUNTER — Other Ambulatory Visit: Payer: Self-pay | Admitting: *Deleted

## 2012-11-04 MED ORDER — DILTIAZEM HCL 120 MG PO TABS: ORAL_TABLET | ORAL | Status: DC

## 2012-11-06 ENCOUNTER — Ambulatory Visit: Payer: Medicare Other | Admitting: Rehabilitation

## 2012-11-06 DIAGNOSIS — M25559 Pain in unspecified hip: Secondary | ICD-10-CM | POA: Diagnosis not present

## 2012-11-06 DIAGNOSIS — IMO0001 Reserved for inherently not codable concepts without codable children: Secondary | ICD-10-CM | POA: Diagnosis not present

## 2012-11-06 DIAGNOSIS — M545 Low back pain: Secondary | ICD-10-CM | POA: Diagnosis not present

## 2012-11-09 ENCOUNTER — Other Ambulatory Visit: Payer: Self-pay | Admitting: Cardiology

## 2012-11-10 ENCOUNTER — Ambulatory Visit: Payer: Medicare Other | Attending: Family | Admitting: Rehabilitation

## 2012-11-10 DIAGNOSIS — M25559 Pain in unspecified hip: Secondary | ICD-10-CM | POA: Diagnosis not present

## 2012-11-10 DIAGNOSIS — IMO0001 Reserved for inherently not codable concepts without codable children: Secondary | ICD-10-CM | POA: Insufficient documentation

## 2012-11-10 DIAGNOSIS — M545 Low back pain, unspecified: Secondary | ICD-10-CM | POA: Diagnosis not present

## 2012-11-13 ENCOUNTER — Ambulatory Visit: Payer: Medicare Other | Admitting: Rehabilitation

## 2012-11-13 ENCOUNTER — Other Ambulatory Visit: Payer: Self-pay | Admitting: Cardiology

## 2012-11-16 ENCOUNTER — Ambulatory Visit (INDEPENDENT_AMBULATORY_CARE_PROVIDER_SITE_OTHER): Payer: Medicare Other | Admitting: *Deleted

## 2012-11-16 DIAGNOSIS — I4891 Unspecified atrial fibrillation: Secondary | ICD-10-CM

## 2012-11-16 DIAGNOSIS — Z7901 Long term (current) use of anticoagulants: Secondary | ICD-10-CM | POA: Diagnosis not present

## 2012-11-17 DIAGNOSIS — N3941 Urge incontinence: Secondary | ICD-10-CM | POA: Diagnosis not present

## 2012-11-18 ENCOUNTER — Ambulatory Visit: Payer: Medicare Other | Admitting: Rehabilitation

## 2012-11-20 ENCOUNTER — Ambulatory Visit: Payer: Medicare Other | Admitting: Rehabilitation

## 2012-11-24 ENCOUNTER — Ambulatory Visit: Payer: Medicare Other | Admitting: Rehabilitation

## 2012-11-27 ENCOUNTER — Ambulatory Visit: Payer: Medicare Other | Admitting: Rehabilitation

## 2012-12-01 ENCOUNTER — Ambulatory Visit: Payer: Medicare Other | Admitting: Rehabilitation

## 2012-12-04 ENCOUNTER — Ambulatory Visit: Payer: Medicare Other | Admitting: Rehabilitation

## 2012-12-08 ENCOUNTER — Ambulatory Visit: Payer: Medicare Other | Admitting: Rehabilitation

## 2012-12-09 ENCOUNTER — Ambulatory Visit: Payer: Medicare Other | Admitting: Rehabilitation

## 2012-12-10 ENCOUNTER — Ambulatory Visit: Payer: Medicare Other | Admitting: Rehabilitation

## 2012-12-16 ENCOUNTER — Encounter: Payer: Self-pay | Admitting: Cardiology

## 2012-12-16 ENCOUNTER — Ambulatory Visit (INDEPENDENT_AMBULATORY_CARE_PROVIDER_SITE_OTHER): Payer: Medicare Other | Admitting: Cardiology

## 2012-12-16 VITALS — BP 130/80 | HR 58 | Wt 173.0 lb

## 2012-12-16 DIAGNOSIS — I4891 Unspecified atrial fibrillation: Secondary | ICD-10-CM | POA: Diagnosis not present

## 2012-12-16 DIAGNOSIS — I10 Essential (primary) hypertension: Secondary | ICD-10-CM | POA: Diagnosis not present

## 2012-12-16 LAB — CBC
HCT: 36.7 % (ref 36.0–46.0)
Hemoglobin: 13 g/dL (ref 12.0–15.0)
MCH: 30.7 pg (ref 26.0–34.0)
MCV: 86.8 fL (ref 78.0–100.0)
Platelets: 203 10*3/uL (ref 150–400)
RBC: 4.23 MIL/uL (ref 3.87–5.11)

## 2012-12-16 LAB — BASIC METABOLIC PANEL WITH GFR
CO2: 27 mEq/L (ref 19–32)
Calcium: 9.2 mg/dL (ref 8.4–10.5)
Glucose, Bld: 125 mg/dL — ABNORMAL HIGH (ref 70–99)
Sodium: 143 mEq/L (ref 135–145)

## 2012-12-16 NOTE — Progress Notes (Signed)
   HPI: Pleasant female for fu of atrial fibrillation. Patient from [place]. I do not have her prior cardiac records available. Pt was apparently having an echocardiogram and noted to be in atrial fibrillation. She has been on Cardizem and Coumadin since then. Also with chronic chest pain; had stress test in [facility] that apparently was neg. Echocardiogram here in August of 2012 showed normal LV function, mild left ventricular hypertrophy and moderate left atrial enlargement. I last saw her in June of 2013. Since then, the patient has dyspnea with more extreme activities but not with routine activities. It is relieved with rest. It is not associated with chest pain. There is no orthopnea, PND or pedal edema. There is no syncope or palpitations. There is no exertional chest pain.    Current Outpatient Prescriptions  Medication Sig Dispense Refill  . cholecalciferol (VITAMIN D) 1000 UNITS tablet Take 3,000 Units by mouth daily.      . Cranberry 250 MG CAPS Take 2 capsules by mouth every morning.        . cyclobenzaprine (FLEXERIL) 5 MG tablet Take 1 tablet (5 mg total) by mouth 3 (three) times daily as needed for muscle spasms.  20 tablet  0  . diltiazem (CARDIZEM) 120 MG tablet TAKE 1 TABLET BY MOUTH EVERY DAY  30 tablet  10  . multivitamin-iron-minerals-folic acid (CENTRUM) chewable tablet Chew 1 tablet by mouth daily.      Marland Kitchen warfarin (COUMADIN) 5 MG tablet Take as directed by coumadin clinic  90 tablet  0   No current facility-administered medications for this visit.     Past Medical History  Diagnosis Date  . Normal cardiac stress test 01/27/09    lvef 73% done at [facility] (nuclear stress)  . Atrial fibrillation     on Coumadin since mid 2010  . HTN (hypertension)   . Incontinence   . Vitamin D deficiency 12/13/2011    Past Surgical History  Procedure Laterality Date  . Laminectomy  1989 and 1990    L4, L5  . Bilateral mammoplasty    . Tonsillectomy and adenoidectomy  1941  .  Abdominal hysterectomy  2007  . Arm skin lesion biopsy / excision      pre-cancerous    History   Social History  . Marital Status: Widowed    Spouse Name: N/A    Number of Children: 2  . Years of Education: N/A   Occupational History  .     Social History Main Topics  . Smoking status: Never Smoker   . Smokeless tobacco: Never Used  . Alcohol Use: Yes     Comment: rare- 2 drinks/ week  . Drug Use: Not on file  . Sexually Active: Not on file   Other Topics Concern  . Not on file   Social History Narrative   Regular Exercise- no    ROS: no fevers or chills, productive cough, hemoptysis, dysphasia, odynophagia, melena, hematochezia, dysuria, hematuria, rash, seizure activity, orthopnea, PND, pedal edema, claudication. Remaining systems are negative.  Physical Exam: Well-developed well-nourished in no acute distress.  Skin is warm and dry.  HEENT is normal.  Neck is supple.  Chest is clear to auscultation with normal expansion.  Cardiovascular exam is regular rate and rhythm.  Abdominal exam nontender or distended. No masses palpated. Extremities show no edema. neuro grossly intact  ECG sinus rhythm at a rate of 58. No significant ST changes.

## 2012-12-16 NOTE — Assessment & Plan Note (Signed)
Continue present medications. 

## 2012-12-16 NOTE — Patient Instructions (Addendum)
Your physician wants you to follow-up in: ONE YEAR WITH DR Shelda Pal will receive a reminder letter in the mail two months in advance. If you don't receive a letter, please call our office to schedule the follow-up appointment.   ELIQUIS TO REPLACE WARFARIN  Your physician recommends that you HAVE LAB WORK TODAY

## 2012-12-16 NOTE — Assessment & Plan Note (Signed)
Patient remains in sinus. Continue Cardizem. Continue Coumadin. Check hemoglobin and renal function. I have provided the name of apixaban. She will contact her insurance company to see if it is covered. If so we will change on Coumadin.

## 2012-12-28 ENCOUNTER — Ambulatory Visit (INDEPENDENT_AMBULATORY_CARE_PROVIDER_SITE_OTHER): Payer: Medicare Other | Admitting: Pharmacist

## 2012-12-28 DIAGNOSIS — Z7901 Long term (current) use of anticoagulants: Secondary | ICD-10-CM | POA: Diagnosis not present

## 2012-12-28 DIAGNOSIS — I4891 Unspecified atrial fibrillation: Secondary | ICD-10-CM | POA: Diagnosis not present

## 2013-01-08 DIAGNOSIS — N3941 Urge incontinence: Secondary | ICD-10-CM | POA: Diagnosis not present

## 2013-01-08 DIAGNOSIS — N390 Urinary tract infection, site not specified: Secondary | ICD-10-CM | POA: Diagnosis not present

## 2013-01-13 DIAGNOSIS — R350 Frequency of micturition: Secondary | ICD-10-CM | POA: Diagnosis not present

## 2013-01-13 DIAGNOSIS — N3941 Urge incontinence: Secondary | ICD-10-CM | POA: Diagnosis not present

## 2013-02-03 DIAGNOSIS — R32 Unspecified urinary incontinence: Secondary | ICD-10-CM | POA: Diagnosis not present

## 2013-02-03 DIAGNOSIS — N390 Urinary tract infection, site not specified: Secondary | ICD-10-CM | POA: Diagnosis not present

## 2013-02-03 DIAGNOSIS — R350 Frequency of micturition: Secondary | ICD-10-CM | POA: Diagnosis not present

## 2013-02-03 DIAGNOSIS — N3941 Urge incontinence: Secondary | ICD-10-CM | POA: Diagnosis not present

## 2013-02-03 DIAGNOSIS — R3915 Urgency of urination: Secondary | ICD-10-CM | POA: Diagnosis not present

## 2013-02-05 DIAGNOSIS — D235 Other benign neoplasm of skin of trunk: Secondary | ICD-10-CM | POA: Diagnosis not present

## 2013-02-05 DIAGNOSIS — L570 Actinic keratosis: Secondary | ICD-10-CM | POA: Diagnosis not present

## 2013-02-05 DIAGNOSIS — D485 Neoplasm of uncertain behavior of skin: Secondary | ICD-10-CM | POA: Diagnosis not present

## 2013-02-05 DIAGNOSIS — C44319 Basal cell carcinoma of skin of other parts of face: Secondary | ICD-10-CM | POA: Diagnosis not present

## 2013-02-10 ENCOUNTER — Ambulatory Visit (INDEPENDENT_AMBULATORY_CARE_PROVIDER_SITE_OTHER): Payer: Medicare Other | Admitting: *Deleted

## 2013-02-10 DIAGNOSIS — Z7901 Long term (current) use of anticoagulants: Secondary | ICD-10-CM | POA: Diagnosis not present

## 2013-02-10 DIAGNOSIS — I4891 Unspecified atrial fibrillation: Secondary | ICD-10-CM | POA: Diagnosis not present

## 2013-03-10 DIAGNOSIS — IMO0002 Reserved for concepts with insufficient information to code with codable children: Secondary | ICD-10-CM

## 2013-03-10 HISTORY — DX: Reserved for concepts with insufficient information to code with codable children: IMO0002

## 2013-03-12 ENCOUNTER — Encounter: Payer: Self-pay | Admitting: Family

## 2013-03-12 ENCOUNTER — Ambulatory Visit (INDEPENDENT_AMBULATORY_CARE_PROVIDER_SITE_OTHER): Payer: Medicare Other | Admitting: Family

## 2013-03-12 VITALS — BP 120/64 | HR 78 | Temp 98.2°F | Resp 16 | Ht 65.5 in | Wt 171.0 lb

## 2013-03-12 DIAGNOSIS — J069 Acute upper respiratory infection, unspecified: Secondary | ICD-10-CM | POA: Diagnosis not present

## 2013-03-12 DIAGNOSIS — J029 Acute pharyngitis, unspecified: Secondary | ICD-10-CM

## 2013-03-12 LAB — POCT RAPID STREP A (OFFICE): Rapid Strep A Screen: NEGATIVE

## 2013-03-12 MED ORDER — BENZONATATE 100 MG PO CAPS: 100.0000 mg | ORAL_CAPSULE | Freq: Three times a day (TID) | ORAL | Status: DC | PRN

## 2013-03-12 NOTE — Addendum Note (Signed)
Addended by: Mervin Kung A on: 03/12/2013 04:53 PM   Modules accepted: Orders

## 2013-03-12 NOTE — Patient Instructions (Addendum)

## 2013-03-12 NOTE — Assessment & Plan Note (Signed)
Symptoms most consistent with viral uri at this point. Rapid strep is negative. rx provided for tessalon. Pt is instructed to call if symptoms worsen or if not improved in 2-3 days.  She verbalizes understanding.

## 2013-03-12 NOTE — Progress Notes (Signed)
Subjective:    Patient ID: Carla Curtis, female    DOB: [birth year], 79 y.o.   MRN: [id number]  HPI  Ms. Territo is a 79 yr old female who presents today with chief complaint of cough.  Sore throat started Wednesday night, then she developed cough. Cough is generally dry.  She reports nasal discharge is white in color.  Denies associated ear pain. Recently attended the "[place]ub[place]" in [place]. There were 6000 people there.  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 Normal cardiac stress test 01/27/09    lvef 73% done at [facility] (nuclear stress)  . Atrial fibrillation     on Coumadin since mid 2010  . HTN (hypertension)   . Incontinence   . Vitamin D deficiency 12/13/2011    History   Social History  . Marital Status: Widowed    Spouse Name: N/A    Number of Children: 2  . Years of Education: N/A   Occupational History  .     Social History Main Topics  . Smoking status: Never Smoker   . Smokeless tobacco: Never Used  . Alcohol Use: Yes     Comment: rare- 2 drinks/ week  . Drug Use: Not on file  . Sexual Activity: Not on file   Other Topics Concern  . Not on file   Social History Narrative   Regular Exercise- no    Past Surgical History  Procedure Laterality Date  . Laminectomy  1989 and 1990    L4, L5  . Bilateral mammoplasty    . Tonsillectomy and adenoidectomy  1941  . Abdominal hysterectomy  2007  . Arm skin lesion biopsy / excision      pre-cancerous    Family History  Problem Relation Age of Onset  . Hypertension Mother   . COPD Father     smoker  . Cancer Father     lung  . Cancer Sister     breast  . Bell's palsy Sister   . Heart disease Sister     CHF  . Cancer Daughter     breast/ stage 1- s/p lumpectomy and radiation  . Cancer Other     Hodgkins Disease     No Known Allergies  Current Outpatient Prescriptions on File Prior to Visit  Medication Sig Dispense Refill  . cholecalciferol (VITAMIN D) 1000 UNITS tablet Take 3,000 Units by mouth daily.      . Cranberry 250 MG CAPS Take 2 capsules by mouth every morning.        . diltiazem (CARDIZEM) 120 MG tablet TAKE 1 TABLET BY MOUTH EVERY DAY  30 tablet  10  . multivitamin-iron-minerals-folic acid (CENTRUM) chewable tablet Chew 1 tablet by mouth daily.      Marland Kitchen warfarin (COUMADIN) 5 MG tablet Take as directed by coumadin clinic  90 tablet  0   No current facility-administered medications on file prior to visit.    BP 120/64  Pulse 78  Temp(Src) 98.2 F (36.8 C) (Oral)  Resp 16  Ht 5' 5.5" (1.664 m)  Wt 171 lb 0.6 oz (77.583 kg)  BMI 28.02 kg/m2  SpO2 97%       Objective:   Physical Exam  Constitutional: She appears well-developed and well-nourished. No distress.  HENT:  Head: Normocephalic  and atraumatic.  Left Ear: Tympanic membrane and ear canal normal.  Mouth/Throat: Posterior oropharyngeal erythema present. No oropharyngeal exudate or posterior oropharyngeal edema.  R canal partially occluded by cerumen.  Visualized portion of right TM is pink. No visible bulging.  Cardiovascular: Normal rate and regular rhythm.   No murmur heard. Pulmonary/Chest: Effort normal and breath sounds normal. No respiratory distress. She has no wheezes. She has no rales. She exhibits no tenderness.  Musculoskeletal: She exhibits no edema.  Lymphadenopathy:    She has no cervical adenopathy.          Assessment & Plan:

## 2013-03-23 ENCOUNTER — Other Ambulatory Visit: Payer: Self-pay | Admitting: Cardiology

## 2013-03-23 DIAGNOSIS — C44319 Basal cell carcinoma of skin of other parts of face: Secondary | ICD-10-CM | POA: Diagnosis not present

## 2013-03-30 ENCOUNTER — Ambulatory Visit (INDEPENDENT_AMBULATORY_CARE_PROVIDER_SITE_OTHER): Payer: Medicare Other | Admitting: *Deleted

## 2013-03-30 DIAGNOSIS — Z7901 Long term (current) use of anticoagulants: Secondary | ICD-10-CM

## 2013-03-30 DIAGNOSIS — I4891 Unspecified atrial fibrillation: Secondary | ICD-10-CM | POA: Diagnosis not present

## 2013-03-30 LAB — POCT INR: INR: 1.7

## 2013-04-27 ENCOUNTER — Ambulatory Visit (INDEPENDENT_AMBULATORY_CARE_PROVIDER_SITE_OTHER): Payer: Medicare Other | Admitting: *Deleted

## 2013-04-27 DIAGNOSIS — I4891 Unspecified atrial fibrillation: Secondary | ICD-10-CM

## 2013-04-27 DIAGNOSIS — Z7901 Long term (current) use of anticoagulants: Secondary | ICD-10-CM | POA: Diagnosis not present

## 2013-04-27 LAB — POCT INR: INR: 1.7

## 2013-05-13 ENCOUNTER — Ambulatory Visit (INDEPENDENT_AMBULATORY_CARE_PROVIDER_SITE_OTHER): Payer: Medicare Other | Admitting: General Practice

## 2013-05-13 DIAGNOSIS — I4891 Unspecified atrial fibrillation: Secondary | ICD-10-CM

## 2013-05-13 DIAGNOSIS — Z7901 Long term (current) use of anticoagulants: Secondary | ICD-10-CM | POA: Diagnosis not present

## 2013-05-13 DIAGNOSIS — Z5181 Encounter for therapeutic drug level monitoring: Secondary | ICD-10-CM

## 2013-05-13 LAB — POCT INR: INR: 2.4

## 2013-05-17 ENCOUNTER — Telehealth: Payer: Self-pay | Admitting: Family

## 2013-05-17 NOTE — Telephone Encounter (Signed)
Rec'd from [facility] forward 1 page to Dr.OLendell Caprice

## 2013-05-25 DIAGNOSIS — D485 Neoplasm of uncertain behavior of skin: Secondary | ICD-10-CM | POA: Diagnosis not present

## 2013-05-25 DIAGNOSIS — L570 Actinic keratosis: Secondary | ICD-10-CM | POA: Diagnosis not present

## 2013-05-25 DIAGNOSIS — L821 Other seborrheic keratosis: Secondary | ICD-10-CM | POA: Diagnosis not present

## 2013-06-07 ENCOUNTER — Ambulatory Visit (INDEPENDENT_AMBULATORY_CARE_PROVIDER_SITE_OTHER): Payer: Medicare Other | Admitting: Pharmacist

## 2013-06-07 DIAGNOSIS — Z5181 Encounter for therapeutic drug level monitoring: Secondary | ICD-10-CM

## 2013-06-07 DIAGNOSIS — Z7901 Long term (current) use of anticoagulants: Secondary | ICD-10-CM

## 2013-06-07 DIAGNOSIS — I4891 Unspecified atrial fibrillation: Secondary | ICD-10-CM

## 2013-06-08 DIAGNOSIS — L570 Actinic keratosis: Secondary | ICD-10-CM | POA: Diagnosis not present

## 2013-06-08 DIAGNOSIS — L821 Other seborrheic keratosis: Secondary | ICD-10-CM | POA: Diagnosis not present

## 2013-06-23 DIAGNOSIS — Z23 Encounter for immunization: Secondary | ICD-10-CM | POA: Diagnosis not present

## 2013-06-24 DIAGNOSIS — L570 Actinic keratosis: Secondary | ICD-10-CM | POA: Diagnosis not present

## 2013-07-05 ENCOUNTER — Ambulatory Visit (INDEPENDENT_AMBULATORY_CARE_PROVIDER_SITE_OTHER): Payer: Medicare Other

## 2013-07-05 DIAGNOSIS — Z7901 Long term (current) use of anticoagulants: Secondary | ICD-10-CM | POA: Diagnosis not present

## 2013-07-05 DIAGNOSIS — I4891 Unspecified atrial fibrillation: Secondary | ICD-10-CM

## 2013-07-05 DIAGNOSIS — Z5181 Encounter for therapeutic drug level monitoring: Secondary | ICD-10-CM | POA: Diagnosis not present

## 2013-07-05 LAB — POCT INR: INR: 2.1

## 2013-07-20 ENCOUNTER — Ambulatory Visit (INDEPENDENT_AMBULATORY_CARE_PROVIDER_SITE_OTHER): Payer: Medicare Other | Admitting: Family

## 2013-07-20 ENCOUNTER — Encounter: Payer: Self-pay | Admitting: Family

## 2013-07-20 VITALS — BP 104/68 | HR 60 | Temp 98.0°F | Resp 12 | Ht 65.5 in | Wt 175.1 lb

## 2013-07-20 DIAGNOSIS — H43399 Other vitreous opacities, unspecified eye: Secondary | ICD-10-CM | POA: Diagnosis not present

## 2013-07-20 DIAGNOSIS — Z23 Encounter for immunization: Secondary | ICD-10-CM

## 2013-07-20 DIAGNOSIS — R32 Unspecified urinary incontinence: Secondary | ICD-10-CM | POA: Diagnosis not present

## 2013-07-20 DIAGNOSIS — Z1231 Encounter for screening mammogram for malignant neoplasm of breast: Secondary | ICD-10-CM

## 2013-07-20 DIAGNOSIS — Z1239 Encounter for other screening for malignant neoplasm of breast: Secondary | ICD-10-CM

## 2013-07-20 DIAGNOSIS — H16229 Keratoconjunctivitis sicca, not specified as Sjogren's, unspecified eye: Secondary | ICD-10-CM | POA: Diagnosis not present

## 2013-07-20 DIAGNOSIS — R739 Hyperglycemia, unspecified: Secondary | ICD-10-CM

## 2013-07-20 DIAGNOSIS — I4891 Unspecified atrial fibrillation: Secondary | ICD-10-CM

## 2013-07-20 DIAGNOSIS — Z0000 Encounter for general adult medical examination without abnormal findings: Secondary | ICD-10-CM

## 2013-07-20 DIAGNOSIS — I10 Essential (primary) hypertension: Secondary | ICD-10-CM | POA: Diagnosis not present

## 2013-07-20 DIAGNOSIS — H524 Presbyopia: Secondary | ICD-10-CM | POA: Diagnosis not present

## 2013-07-20 DIAGNOSIS — E785 Hyperlipidemia, unspecified: Secondary | ICD-10-CM | POA: Diagnosis not present

## 2013-07-20 DIAGNOSIS — R7309 Other abnormal glucose: Secondary | ICD-10-CM | POA: Diagnosis not present

## 2013-07-20 DIAGNOSIS — R7301 Impaired fasting glucose: Secondary | ICD-10-CM | POA: Insufficient documentation

## 2013-07-20 LAB — HEMOGLOBIN A1C
HEMOGLOBIN A1C: 5.6 % (ref <5.7)
MEAN PLASMA GLUCOSE: 114 mg/dL (ref <117)

## 2013-07-20 LAB — LIPID PANEL
CHOL/HDL RATIO: 3.2 ratio
CHOLESTEROL: 215 mg/dL — AB (ref 0–200)
HDL: 67 mg/dL (ref >39)
LDL Cholesterol: 131 mg/dL — ABNORMAL HIGH (ref 0–99)
Triglycerides: 85 mg/dL (ref <150)
VLDL: 17 mg/dL (ref 0–40)

## 2013-07-20 LAB — HEPATIC FUNCTION PANEL
ALBUMIN: 3.8 g/dL (ref 3.5–5.2)
AST: 18 U/L (ref 0–37)
Alkaline Phosphatase: 64 U/L (ref 39–117)
Bilirubin, Direct: 0.1 mg/dL (ref 0.0–0.3)
Indirect Bilirubin: 0.6 mg/dL (ref 0.2–1.2)
TOTAL PROTEIN: 6.3 g/dL (ref 6.0–8.3)
Total Bilirubin: 0.7 mg/dL (ref 0.2–1.2)

## 2013-07-20 NOTE — Assessment & Plan Note (Signed)
Some improvement with botox injections.  Follows with Dr. Estill Dooms.

## 2013-07-20 NOTE — Progress Notes (Signed)
Pre visit review using our clinic review tool, if applicable. No additional management support is needed unless otherwise documented below in the visit note. 

## 2013-07-20 NOTE — Assessment & Plan Note (Signed)
Rate stable on cardizem.  Continue coumadin- management per coumadin clinic.

## 2013-07-20 NOTE — Progress Notes (Signed)
Subjective:    Patient ID: Carla Curtis, female    DOB: 05-May-1933, 80 y.o.   MRN: [id number]  HPI  Subjective:   Patient here for Medicare annual wellness visit and management of other chronic and acute problems.  AF- denies SOB, palpitations  Vitamin D- stable on supplement. She brings outside lab work with her today.  Hyperglycemia- she brings lab work with her which shows mild fasting hyperglycemia.   Urinary incontinence- takes cranberry extract. Has had botox injections. Helps some with incontinence.  Wears a pad "all the time."    Risk factors: none  Roster of Physicians Providing Medical Care to Patient: Dr. Stanford Breed Dr.  [facility] Dr. Estill Dooms- urology   Activities of Daily Living  In your present state of health, do you have any difficulty performing the following activities? Preparing food and eating?: No  Bathing yourself: No  Getting dressed: No  Using the toilet:No  Moving around from place to place: No  In the past year have you fallen or had a near fall?:No    Home Safety: Has smoke detector and wears seat belts. Gun in attic- away from children.  No excess sun exposure.  Diet and Exercise  Current exercise habits:  Dietary issues discussed: healthy diet   Depression Screen  (Note: if answer to either of the following is "Yes", then a more complete depression screening is indicated)  Q1: Over the past two weeks, have you felt down, depressed or hopeless?no  Q2: Over the past two weeks, have you felt little interest or pleasure in doing things? no   The following portions of the patient's history were reviewed and updated as appropriate: allergies, current medications, past family history, past medical history, past social history, past surgical history and problem list.   ROS  See HPI  Past Medical History  Diagnosis Date  . Normal cardiac stress test 01/27/09    lvef 73% done at [facility] (nuclear stress)  . Atrial fibrillation       on Coumadin since mid 2010  . HTN (hypertension)   . Incontinence   . Vitamin D deficiency 12/13/2011  . Squamous cell carcinoma 10/14    left side of nose    History   Social History  . Marital Status: Widowed    Spouse Name: N/A    Number of Children: 2  . Years of Education: N/A   Occupational History  .     Social History Main Topics  . Smoking status: Never Smoker   . Smokeless tobacco: Never Used  . Alcohol Use: Yes     Comment: rare- 1 drink/ month  . Drug Use: Not on file  . Sexual Activity: No   Other Topics Concern  . Not on file   Social History Narrative   Regular Exercise- no    Past Surgical History  Procedure Laterality Date  . Laminectomy  1989 and 1990    L4, L5  . Bilateral mammoplasty    . Tonsillectomy and adenoidectomy  1941  . Abdominal hysterectomy  2007  . Arm skin lesion biopsy / excision      pre-cancerous    Family History  Problem Relation Age of Onset  . Hypertension Mother   . COPD Father     smoker  . Cancer Father     lung  . Cancer Sister     breast  . Bell's palsy Sister   . Heart disease Sister     CHF  .  Cancer Daughter     breast/ stage 1- s/p lumpectomy and radiation  . Cancer Other     Hodgkins Disease  . Cancer Daughter     No Known Allergies  Current Outpatient Prescriptions on File Prior to Visit  Medication Sig Dispense Refill  . cholecalciferol (VITAMIN D) 1000 UNITS tablet Take 3,000 Units by mouth daily.      . Cranberry 250 MG CAPS Take 2 capsules by mouth every morning.        . diltiazem (CARDIZEM) 120 MG tablet TAKE 1 TABLET BY MOUTH EVERY DAY  30 tablet  10  . multivitamin-iron-minerals-folic acid (CENTRUM) chewable tablet Chew 1 tablet by mouth daily.      Marland Kitchen warfarin (COUMADIN) 5 MG tablet TAKE AS DIRECTED BY COUMADIN CLINIC  90 tablet  0   No current facility-administered medications on file prior to visit.    BP 104/68  Pulse 60  Temp(Src) 98 F (36.7 C) (Oral)  Resp 12  Ht 5'  5.5" (1.664 m)  Wt 175 lb 1.9 oz (79.434 kg)  BMI 28.69 kg/m2  SpO2 97%    Objective:   Vision: had formal dilated eye exam this AM Hearing: able to hear forced whisper at 6 feet Body mass index: Body mass index is 28.69 kg/(m^2). Cognitive Impairment Assessment: cognition, memory and judgment appear normal.  SKIN:  PSYCH:  Assessment:   Medicare wellness utd on preventive parameters    Due for mammogram  Plan:   During the course of the visit the patient was educated and counseled about appropriate screening and preventive services including:  Screening mammography   Diabetes screening   Vaccines / LABS Prevnar today  Patient Instructions (the written plan) was given to the patient.      Review of Systems     Objective:   Physical Exam  Constitutional: She is oriented to person, place, and time. She appears well-developed and well-nourished. No distress.  HENT:  Head: Normocephalic and atraumatic.  Cardiovascular: Normal rate and regular rhythm.   No murmur heard. Pulmonary/Chest: Effort normal and breath sounds normal. No respiratory distress. She has no wheezes. She has no rales. She exhibits no tenderness.  Musculoskeletal: She exhibits no edema.  Neurological: She is alert and oriented to person, place, and time.  Skin: Skin is warm and dry.  Psychiatric: She has a normal mood and affect. Her behavior is normal. Judgment and thought content normal.          Assessment & Plan:

## 2013-07-20 NOTE — Patient Instructions (Signed)
Please complete lab work prior to leaving.  Follow up in 6 months.  

## 2013-07-20 NOTE — Assessment & Plan Note (Signed)
BP Readings from Last 3 Encounters:  07/20/13 104/68  03/12/13 120/64  12/16/12 130/80   BP stable.  Monitor.

## 2013-07-20 NOTE — Assessment & Plan Note (Signed)
Obtain A1C. 

## 2013-07-21 ENCOUNTER — Telehealth: Payer: Self-pay | Admitting: Family

## 2013-07-21 NOTE — Telephone Encounter (Signed)
Relevant patient education mailed to patient.  

## 2013-07-23 ENCOUNTER — Other Ambulatory Visit: Payer: Self-pay | Admitting: *Deleted

## 2013-07-23 ENCOUNTER — Other Ambulatory Visit: Payer: Self-pay | Admitting: Cardiology

## 2013-07-28 ENCOUNTER — Ambulatory Visit (HOSPITAL_BASED_OUTPATIENT_CLINIC_OR_DEPARTMENT_OTHER): Payer: Medicare Other

## 2013-08-04 ENCOUNTER — Ambulatory Visit (HOSPITAL_BASED_OUTPATIENT_CLINIC_OR_DEPARTMENT_OTHER)
Admission: RE | Admit: 2013-08-04 | Discharge: 2013-08-04 | Disposition: A | Discharge status: Home / Self Care | Payer: Medicare Other | Source: Ambulatory Visit | Attending: Family | Admitting: Family

## 2013-08-04 DIAGNOSIS — Z1231 Encounter for screening mammogram for malignant neoplasm of breast: Secondary | ICD-10-CM | POA: Insufficient documentation

## 2013-08-04 DIAGNOSIS — Z1239 Encounter for other screening for malignant neoplasm of breast: Secondary | ICD-10-CM

## 2013-08-16 ENCOUNTER — Ambulatory Visit (INDEPENDENT_AMBULATORY_CARE_PROVIDER_SITE_OTHER): Payer: Medicare Other | Admitting: Pharmacist

## 2013-08-16 DIAGNOSIS — Z7901 Long term (current) use of anticoagulants: Secondary | ICD-10-CM

## 2013-08-16 DIAGNOSIS — Z5181 Encounter for therapeutic drug level monitoring: Secondary | ICD-10-CM

## 2013-08-16 DIAGNOSIS — I4891 Unspecified atrial fibrillation: Secondary | ICD-10-CM | POA: Diagnosis not present

## 2013-08-16 LAB — POCT INR: INR: 2.5

## 2013-09-17 ENCOUNTER — Emergency Department (HOSPITAL_BASED_OUTPATIENT_CLINIC_OR_DEPARTMENT_OTHER): Payer: Medicare Other

## 2013-09-17 ENCOUNTER — Ambulatory Visit (INDEPENDENT_AMBULATORY_CARE_PROVIDER_SITE_OTHER): Payer: Medicare Other | Admitting: *Deleted

## 2013-09-17 ENCOUNTER — Encounter (HOSPITAL_BASED_OUTPATIENT_CLINIC_OR_DEPARTMENT_OTHER): Payer: Self-pay | Admitting: Emergency Medicine

## 2013-09-17 ENCOUNTER — Emergency Department (HOSPITAL_BASED_OUTPATIENT_CLINIC_OR_DEPARTMENT_OTHER)
Admission: EM | Admit: 2013-09-17 | Discharge: 2013-09-17 | Disposition: A | Discharge status: Home / Self Care | Payer: Medicare Other | Attending: Emergency Medicine | Admitting: Emergency Medicine

## 2013-09-17 DIAGNOSIS — R1013 Epigastric pain: Secondary | ICD-10-CM | POA: Diagnosis not present

## 2013-09-17 DIAGNOSIS — I4891 Unspecified atrial fibrillation: Secondary | ICD-10-CM | POA: Insufficient documentation

## 2013-09-17 DIAGNOSIS — E559 Vitamin D deficiency, unspecified: Secondary | ICD-10-CM | POA: Insufficient documentation

## 2013-09-17 DIAGNOSIS — R110 Nausea: Secondary | ICD-10-CM | POA: Diagnosis not present

## 2013-09-17 DIAGNOSIS — Z5181 Encounter for therapeutic drug level monitoring: Secondary | ICD-10-CM

## 2013-09-17 DIAGNOSIS — Z79899 Other long term (current) drug therapy: Secondary | ICD-10-CM | POA: Diagnosis not present

## 2013-09-17 DIAGNOSIS — Z7901 Long term (current) use of anticoagulants: Secondary | ICD-10-CM | POA: Diagnosis not present

## 2013-09-17 DIAGNOSIS — N390 Urinary tract infection, site not specified: Secondary | ICD-10-CM | POA: Insufficient documentation

## 2013-09-17 DIAGNOSIS — Z85828 Personal history of other malignant neoplasm of skin: Secondary | ICD-10-CM | POA: Insufficient documentation

## 2013-09-17 DIAGNOSIS — R42 Dizziness and giddiness: Secondary | ICD-10-CM | POA: Diagnosis not present

## 2013-09-17 DIAGNOSIS — G9389 Other specified disorders of brain: Secondary | ICD-10-CM | POA: Diagnosis not present

## 2013-09-17 DIAGNOSIS — R41840 Attention and concentration deficit: Secondary | ICD-10-CM | POA: Insufficient documentation

## 2013-09-17 DIAGNOSIS — I10 Essential (primary) hypertension: Secondary | ICD-10-CM | POA: Insufficient documentation

## 2013-09-17 LAB — URINALYSIS, ROUTINE W REFLEX MICROSCOPIC
Bilirubin Urine: NEGATIVE
Glucose, UA: NEGATIVE mg/dL
Ketones, ur: 15 mg/dL — AB
NITRITE: POSITIVE — AB
PH: 6 (ref 5.0–8.0)
Protein, ur: NEGATIVE mg/dL
SPECIFIC GRAVITY, URINE: 1.013 (ref 1.005–1.030)
Urobilinogen, UA: 0.2 mg/dL (ref 0.0–1.0)

## 2013-09-17 LAB — COMPREHENSIVE METABOLIC PANEL
ALT: 7 U/L (ref 0–35)
AST: 21 U/L (ref 0–37)
Albumin: 3.9 g/dL (ref 3.5–5.2)
Alkaline Phosphatase: 71 U/L (ref 39–117)
BUN: 17 mg/dL (ref 6–23)
CALCIUM: 10.1 mg/dL (ref 8.4–10.5)
CO2: 26 mEq/L (ref 19–32)
CREATININE: 0.7 mg/dL (ref 0.50–1.10)
Chloride: 103 mEq/L (ref 96–112)
GFR calc Af Amer: >90 mL/min (ref >90)
GFR calc non Af Amer: 80 mL/min — ABNORMAL LOW (ref >90)
Glucose, Bld: 108 mg/dL — ABNORMAL HIGH (ref 70–99)
Potassium: 3.8 mEq/L (ref 3.7–5.3)
Sodium: 141 mEq/L (ref 137–147)
TOTAL PROTEIN: 7 g/dL (ref 6.0–8.3)
Total Bilirubin: 0.5 mg/dL (ref 0.3–1.2)

## 2013-09-17 LAB — DIFFERENTIAL
Basophils Absolute: 0 10*3/uL (ref 0.0–0.1)
Basophils Relative: 0 % (ref 0–1)
EOS ABS: 0.1 10*3/uL (ref 0.0–0.7)
EOS PCT: 1 % (ref 0–5)
LYMPHS ABS: 2.3 10*3/uL (ref 0.7–4.0)
Lymphocytes Relative: 28 % (ref 12–46)
MONO ABS: 1.2 10*3/uL — AB (ref 0.1–1.0)
Monocytes Relative: 15 % — ABNORMAL HIGH (ref 3–12)
Neutro Abs: 4.8 10*3/uL (ref 1.7–7.7)
Neutrophils Relative %: 57 % (ref 43–77)

## 2013-09-17 LAB — CBC
HEMATOCRIT: 43.1 % (ref 36.0–46.0)
Hemoglobin: 14.9 g/dL (ref 12.0–15.0)
MCH: 31.7 pg (ref 26.0–34.0)
MCHC: 34.6 g/dL (ref 30.0–36.0)
MCV: 91.7 fL (ref 78.0–100.0)
PLATELETS: 198 10*3/uL (ref 150–400)
RBC: 4.7 MIL/uL (ref 3.87–5.11)
RDW: 14.2 % (ref 11.5–15.5)
WBC: 8.4 10*3/uL (ref 4.0–10.5)

## 2013-09-17 LAB — URINE MICROSCOPIC-ADD ON

## 2013-09-17 LAB — TROPONIN I: Troponin I: <0.3 ng/mL (ref <0.30)

## 2013-09-17 LAB — POCT INR: INR: 2.1

## 2013-09-17 MED ORDER — DEXTROSE 5 % IV SOLN
1.0000 g | INTRAVENOUS | Status: DC
  Administered 2013-09-17: 1 g via INTRAVENOUS

## 2013-09-17 MED ORDER — CEPHALEXIN 500 MG PO CAPS: 500.0000 mg | ORAL_CAPSULE | Freq: Four times a day (QID) | ORAL | Status: DC

## 2013-09-17 MED ORDER — ONDANSETRON HCL 4 MG PO TABS: 4.0000 mg | ORAL_TABLET | Freq: Four times a day (QID) | ORAL | Status: DC

## 2013-09-17 MED ORDER — CEFTRIAXONE SODIUM 1 G IJ SOLR
INTRAMUSCULAR | Status: AC
  Filled 2013-09-17: qty 10

## 2013-09-17 NOTE — Discharge Instructions (Signed)
Urinary Tract Infection °A urinary tract infection (UTI) can occur any place along the urinary tract. The tract includes the kidneys, ureters, bladder, and urethra. A type of germ called bacteria often causes a UTI. UTIs are often helped with antibiotic medicine.  °HOME CARE  °· If given, take antibiotics as told by your doctor. Finish them even if you start to feel better. °· Drink enough fluids to keep your pee (urine) clear or pale yellow. °· Avoid tea, drinks with caffeine, and bubbly (carbonated) drinks. °· Pee often. Avoid holding your pee in for a long time. °· Pee before and after having sex (intercourse). °· Wipe from front to back after you poop (bowel movement) if you are a woman. Use each tissue only once. °GET HELP RIGHT AWAY IF:  °· You have back pain. °· You have lower belly (abdominal) pain. °· You have chills. °· You feel sick to your stomach (nauseous). °· You throw up (vomit). °· Your burning or discomfort with peeing does not go away. °· You have a fever. °· Your symptoms are not better in 3 days. °MAKE SURE YOU:  °· Understand these instructions. °· Will watch your condition. °· Will get help right away if you are not doing well or get worse. °Document Released: 11/13/2007 Document Revised: 02/19/2012 Document Reviewed: 12/26/2011 °Exit[employer]® Patient Information ©2014 [employer]. ° °

## 2013-09-17 NOTE — ED Notes (Signed)
Patient transported to CT 

## 2013-09-17 NOTE — ED Notes (Addendum)
Dizziness x 2 days. States her memory is foggy. Nausea. Ambulatory without difficulty. Has been having pain in her jaw this afternoon. Denies chest pain.

## 2013-09-17 NOTE — ED Provider Notes (Signed)
CSN: [id number]     Arrival date & time 09/17/13  1744 History  This chart was scribed for Kathalene Frames, MD by Marcha Dutton, ED Scribe. This patient was seen in room MH03/MH03 and the patient's care was started at 6:22 PM.    Chief Complaint  Patient presents with  . Dizziness      The history is provided by the patient. No language interpreter was used.  HPI Comments: LAKIA GRITTON is a 80 y.o. female who presents to the Emergency Department complaining of constant dizziness that began 2 days ago. She reports associated lightheadedness, nausea, difficulty focusing, and dry heaving. She states she feels unsteady on her feet. She states she called a nursing hotline and they advised her to come into the ED because her symptoms were persisting. She denies h/o CVA. Pt denies CP or falling to one side. Pt reports a h/o irregular heartbeat for which she takes coumadin and dilitiazem.     Past Medical History  Diagnosis Date  . Normal cardiac stress test 01/27/09    lvef 73% done at [facility] (nuclear stress)  . Atrial fibrillation     on Coumadin since mid 2010  . HTN (hypertension)   . Incontinence   . Vitamin D deficiency 12/13/2011  . Squamous cell carcinoma 10/14    left side of nose    Past Surgical History  Procedure Laterality Date  . Laminectomy  1989 and 1990    L4, L5  . Bilateral mammoplasty    . Tonsillectomy and adenoidectomy  1941  . Abdominal hysterectomy  2007  . Arm skin lesion biopsy / excision      pre-cancerous    Family History  Problem Relation Age of Onset  . Hypertension Mother   . COPD Father     smoker  . Cancer Father     lung  . Cancer Sister     breast  . Bell's palsy Sister   . Heart disease Sister     CHF  . Cancer Daughter     breast/ stage 1- s/p lumpectomy and radiation  . Cancer Other     Hodgkins Disease  . Cancer Daughter     History  Substance Use Topics  . Smoking status: Never Smoker   . Smokeless tobacco: Never Used   . Alcohol Use: Yes     Comment: rare- 1 drink/ month    OB History   Grav Para Term Preterm Abortions TAB SAB Ect Mult Living                  Review of Systems  Cardiovascular: Negative for chest pain.  Gastrointestinal: Positive for nausea. Negative for vomiting.  Neurological: Positive for dizziness.     Allergies  Review of patient's allergies indicates no known allergies.  Home Medications   Current Outpatient Rx  Name  Route  Sig  Dispense  Refill  . Ascorbic Acid (VITAMIN C) 1000 MG tablet   Oral   Take 1,000 mg by mouth daily.         . cephALEXin (KEFLEX) 500 MG capsule   Oral   Take 1 capsule (500 mg total) by mouth 4 (four) times daily.   28 capsule   0   . cholecalciferol (VITAMIN D) 1000 UNITS tablet   Oral   Take 3,000 Units by mouth daily.         . Cranberry 250 MG CAPS   Oral   Take 2 capsules  by mouth every morning.           . diltiazem (CARDIZEM) 120 MG tablet      TAKE 1 TABLET BY MOUTH EVERY DAY   30 tablet   10   . multivitamin-iron-minerals-folic acid (CENTRUM) chewable tablet   Oral   Chew 1 tablet by mouth daily.         . ondansetron (ZOFRAN) 4 MG tablet   Oral   Take 1 tablet (4 mg total) by mouth every 6 (six) hours.   12 tablet   0   . vitamin B-12 (CYANOCOBALAMIN) 500 MCG tablet   Oral   Take 500 mcg by mouth daily.         . vitamin E 1000 UNIT capsule   Oral   Take 1,000 Units by mouth daily.         Marland Kitchen warfarin (COUMADIN) 5 MG tablet      TAKE AS DIRECTED BY COUMADIN CLINIC   90 tablet   0     This is 3 months supply    Triage Vitals: BP 125/63  Pulse 89  Temp(Src) 99.3 F (37.4 C) (Oral)  Resp 20  Ht 5\' 6"  (1.676 m)  Wt 175 lb (79.379 kg)  BMI 28.26 kg/m2  SpO2 96%  Physical Exam  Nursing note and vitals reviewed. Constitutional: She is oriented to person, place, and time. She appears well-developed and well-nourished. No distress.  HENT:  Head: Normocephalic and atraumatic.   Right Ear: External ear normal.  Left Ear: External ear normal.  Mouth/Throat: Oropharynx is clear and moist.  Eyes: Conjunctivae are normal. Right eye exhibits no discharge. Left eye exhibits no discharge. No scleral icterus.  Neck: Neck supple. No tracheal deviation present.  Cardiovascular: Normal rate and intact distal pulses.   Pulmonary/Chest: Effort normal and breath sounds normal. No stridor. No respiratory distress. She has no wheezes. She has no rales.  Abdominal: Soft. Bowel sounds are normal. She exhibits no distension. There is tenderness (mild epigastric). There is no rebound and no guarding.  Musculoskeletal: She exhibits no edema and no tenderness.  Warm, well perfused, and weak but palpable distal pulses  Neurological: She is alert and oriented to person, place, and time. She has normal strength. No cranial nerve deficit (No facial droop, extraocular movements intact, tongue midline ) or sensory deficit. She exhibits normal muscle tone. She displays no seizure activity. Coordination normal.  No pronator drift bilateral upper extrem, able to hold both legs off bed for 5 seconds, sensation intact in all extremities, no visual field cuts, no left or right sided neglect, normal finger-nose exam bilaterally, no nystagmus noted   Skin: Skin is warm and dry. No rash noted.  Psychiatric: She has a normal mood and affect.    ED Course  Procedures (including critical care time)  DIAGNOSTIC STUDIES: Oxygen Saturation is 96% on RA, adequate by my interpretation.    COORDINATION OF CARE: 6:28 PM- Pt advised of plan for treatment and pt agrees.    Labs Review Labs Reviewed  DIFFERENTIAL - Abnormal; Notable for the following:    Monocytes Relative 15 (*)    Monocytes Absolute 1.2 (*)    All other components within normal limits  COMPREHENSIVE METABOLIC PANEL - Abnormal; Notable for the following:    Glucose, Bld 108 (*)    GFR calc non Af Amer 80 (*)    All other components  within normal limits  URINALYSIS, ROUTINE W REFLEX MICROSCOPIC - Abnormal; Notable for  the following:    APPearance CLOUDY (*)    Hgb urine dipstick TRACE (*)    Ketones, ur 15 (*)    Nitrite POSITIVE (*)    Leukocytes, UA LARGE (*)    All other components within normal limits  URINE MICROSCOPIC-ADD ON - Abnormal; Notable for the following:    Squamous Epithelial / LPF FEW (*)    Bacteria, UA MANY (*)    All other components within normal limits  CBC  TROPONIN I   Imaging Review Ct Head Wo Contrast  09/17/2013   CLINICAL DATA:  DIZZINESS for 2 days, gait unsteadiness.  EXAM: CT HEAD WITHOUT CONTRAST  TECHNIQUE: Contiguous axial images were obtained from the base of the skull through the vertex without intravenous contrast.  COMPARISON:  None available for comparison at time of study interpretation.  FINDINGS: The ventricles and sulci are normal for age. No intraparenchymal hemorrhage, mass effect nor midline shift. Patchy supratentorial white matter hypodensities are left expected for patient's age and though non-specific suggest sequelae of chronic small vessel ischemic disease. No acute large vascular territory infarcts.  No abnormal extra-axial fluid collections. Basal cisterns are patent. Mild calcific atherosclerosis of the carotid siphons and included vertebral arteries. Coarse calcification of the pineal gland.  No skull fracture. Partially imaged right maxillary bony wall thickening suggest sequelae of chronic sinusitis without acute component. Trace soft tissue density in right right mastoid tip. Punctate calcifications at the level of the optic discs bilaterally, consider correlation with ophthalmological examination. The included ocular globes and orbital contents are non-suspicious. Patient is edentulous.  IMPRESSION: No acute intracranial process.  Involutional changes. Mild white matter changes, less than expected for age are nonspecific but could reflect chronic small vessel ischemic  disease.   Electronically Signed   By: Elon Alas   On: 09/17/2013 18:57     EKG Interpretation   Date/Time:  Friday September 17 2013 18:00:23 EDT Ventricular Rate:  78 PR Interval:  160 QRS Duration: 88 QT Interval:  390 QTC Calculation: 444 R Axis:   25 Text Interpretation:  Normal sinus rhythm Normal ECG No old tracing to  compare Confirmed by Lee Kuang  MD-J, Soloman Mckeithan ([id number]) on 09/17/2013 6:15:51 PM      MDM   Final diagnoses:  UTI (lower urinary tract infection)   Patient's neurologic exam does not suggest stroke. Urinalysis is consistent with urinary tract infection. This is the likely cause of the patient's symptoms. She was given a dose of antibiotics in the emergency department. I will discharge her home on a course of oral antibiotics. I recommend followup closely with her primary Dr.  I personally performed the services described in this documentation, which was scribed in my presence.  The recorded information has been reviewed and is accurate.   Kathalene Frames, MD 09/17/13 [id number]

## 2013-09-17 NOTE — ED Notes (Signed)
Pt ambulated 100 ft independently with no distress, vs stable

## 2013-09-19 ENCOUNTER — Telehealth: Payer: Self-pay | Admitting: Family

## 2013-09-19 NOTE — Telephone Encounter (Addendum)
pls call pt and arrange ed follow up this week.

## 2013-09-21 NOTE — Telephone Encounter (Signed)
Left message for patient to return my call.

## 2013-09-23 NOTE — Telephone Encounter (Signed)
Left message for patient to return my call.

## 2013-09-28 NOTE — Telephone Encounter (Signed)
Left message for patient to return my call.

## 2013-09-28 NOTE — Telephone Encounter (Signed)
Mailed letter °

## 2013-10-05 ENCOUNTER — Encounter: Payer: Self-pay | Admitting: Family

## 2013-10-05 ENCOUNTER — Telehealth: Payer: Self-pay | Admitting: Family

## 2013-10-05 ENCOUNTER — Ambulatory Visit (INDEPENDENT_AMBULATORY_CARE_PROVIDER_SITE_OTHER): Payer: Medicare Other | Admitting: Family

## 2013-10-05 VITALS — BP 132/82 | HR 85 | Temp 97.9°F | Resp 16 | Ht 65.5 in | Wt 177.0 lb

## 2013-10-05 DIAGNOSIS — R42 Dizziness and giddiness: Secondary | ICD-10-CM | POA: Diagnosis not present

## 2013-10-05 DIAGNOSIS — H539 Unspecified visual disturbance: Secondary | ICD-10-CM

## 2013-10-05 DIAGNOSIS — H547 Unspecified visual loss: Secondary | ICD-10-CM

## 2013-10-05 NOTE — Progress Notes (Signed)
Subjective:    Patient ID: Carla Curtis, female    DOB: Jul 20, 1932, 80 y.o.   MRN: [id number]  HPI  Carla Curtis is a 80 yr old female who presents today for follow up. She was seen on 09/17/13 in the ED with complaint of dizziness and nausea. Urinalysis was suggestive of UTI and she was sent home with rx for keflex.  A urine culture was not sent.  She reports that she does not have a lot of energy, but is no longer having dizziness or nausea.    Notes occasional "black spots" in center of her vision. None now.     Review of Systems See HPI  Past Medical History  Diagnosis Date  . Normal cardiac stress test 01/27/09    lvef 73% done at [facility] (nuclear stress)  . Atrial fibrillation     on Coumadin since mid 2010  . HTN (hypertension)   . Incontinence   . Vitamin D deficiency 12/13/2011  . Squamous cell carcinoma 10/14    left side of nose    History   Social History  . Marital Status: Widowed    Spouse Name: N/A    Number of Children: 2  . Years of Education: N/A   Occupational History  .     Social History Main Topics  . Smoking status: Never Smoker   . Smokeless tobacco: Never Used  . Alcohol Use: Yes     Comment: rare- 1 drink/ month  . Drug Use: Not on file  . Sexual Activity: No   Other Topics Concern  . Not on file   Social History Narrative   Regular Exercise- no    Past Surgical History  Procedure Laterality Date  . Laminectomy  1989 and 1990    L4, L5  . Bilateral mammoplasty    . Tonsillectomy and adenoidectomy  1941  . Abdominal hysterectomy  2007  . Arm skin lesion biopsy / excision      pre-cancerous    Family History  Problem Relation Age of Onset  . Hypertension Mother   . COPD Father     smoker  . Cancer Father     lung  . Cancer Sister     breast  . Bell's palsy Sister   . Heart disease Sister     CHF  . Cancer Daughter     breast/ stage 1- s/p lumpectomy and radiation  . Cancer Other     Hodgkins Disease  . Cancer  Daughter     No Known Allergies  Current Outpatient Prescriptions on File Prior to Visit  Medication Sig Dispense Refill  . Ascorbic Acid (VITAMIN C) 1000 MG tablet Take 1,000 mg by mouth daily.      . cholecalciferol (VITAMIN D) 1000 UNITS tablet Take 3,000 Units by mouth daily.      . Cranberry 250 MG CAPS Take 2 capsules by mouth every morning.        . diltiazem (CARDIZEM) 120 MG tablet TAKE 1 TABLET BY MOUTH EVERY DAY  30 tablet  10  . multivitamin-iron-minerals-folic acid (CENTRUM) chewable tablet Chew 1 tablet by mouth daily.      . vitamin E 1000 UNIT capsule Take 1,000 Units by mouth daily.      Marland Kitchen warfarin (COUMADIN) 5 MG tablet TAKE AS DIRECTED BY COUMADIN CLINIC  90 tablet  0   No current facility-administered medications on file prior to visit.    BP 132/82  Pulse 85  Temp(Src)  97.9 F (36.6 C) (Oral)  Resp 16  Ht 5' 5.5" (1.664 m)  Wt 177 lb 0.6 oz (80.305 kg)  BMI 29.00 kg/m2  SpO2 99%       Objective:   Physical Exam  Constitutional: She is oriented to person, place, and time. She appears well-developed and well-nourished. No distress.  HENT:  Head: Normocephalic and atraumatic.  Cardiovascular: Normal rate and regular rhythm.   No murmur heard. Pulmonary/Chest: Effort normal and breath sounds normal. No respiratory distress. She has no wheezes. She has no rales. She exhibits no tenderness.  Neurological: She is alert and oriented to person, place, and time.  Psychiatric: She has a normal mood and affect. Her behavior is normal. Judgment and thought content normal.          Assessment & Plan:

## 2013-10-05 NOTE — Progress Notes (Signed)
Pre visit review using our clinic review tool, if applicable. No additional management support is needed unless otherwise documented below in the visit note. 

## 2013-10-05 NOTE — Patient Instructions (Addendum)
Please follow up in 4 months

## 2013-10-05 NOTE — Telephone Encounter (Signed)
Relevant patient education assigned to patient using Emmi. ° °

## 2013-10-07 DIAGNOSIS — R42 Dizziness and giddiness: Secondary | ICD-10-CM | POA: Insufficient documentation

## 2013-10-07 DIAGNOSIS — H353190 Nonexudative age-related macular degeneration, unspecified eye, stage unspecified: Secondary | ICD-10-CM | POA: Diagnosis not present

## 2013-10-07 DIAGNOSIS — H43819 Vitreous degeneration, unspecified eye: Secondary | ICD-10-CM | POA: Diagnosis not present

## 2013-10-07 DIAGNOSIS — H547 Unspecified visual loss: Secondary | ICD-10-CM | POA: Insufficient documentation

## 2013-10-07 NOTE — Assessment & Plan Note (Signed)
Resolved, likely related to UTI.  Monitor.

## 2013-10-07 NOTE — Assessment & Plan Note (Signed)
Refer to opthalmology for further evaluation.

## 2013-10-08 ENCOUNTER — Telehealth: Payer: Self-pay | Admitting: Family

## 2013-10-08 ENCOUNTER — Other Ambulatory Visit: Payer: Self-pay | Admitting: Family

## 2013-10-08 DIAGNOSIS — H547 Unspecified visual loss: Secondary | ICD-10-CM

## 2013-10-08 DIAGNOSIS — Z5181 Encounter for therapeutic drug level monitoring: Secondary | ICD-10-CM

## 2013-10-08 NOTE — Telephone Encounter (Signed)
Received call from Dr. Posey Pronto at [facility].  She requests that we order the following on Carla Curtis to evaluate for amaurosis fugax:  ESR, CBC, carotid doppler, CRP.    Please contact pt and ask her to come to the lab this afternoon or Monday am to complete lab work please.

## 2013-10-08 NOTE — Telephone Encounter (Signed)
Left detailed message on home # to return Monday for additional blood tests and to call if any questions. Pt called back and was notified of below testing. She is [place] today and will return Monday for lab work and has been made aware of carotid u/s for Monday at 5:30pm.

## 2013-10-11 ENCOUNTER — Ambulatory Visit (HOSPITAL_BASED_OUTPATIENT_CLINIC_OR_DEPARTMENT_OTHER)
Admission: RE | Admit: 2013-10-11 | Discharge: 2013-10-11 | Disposition: A | Discharge status: Home / Self Care | Payer: Medicare Other | Source: Ambulatory Visit | Attending: Family | Admitting: Family

## 2013-10-11 ENCOUNTER — Telehealth: Payer: Self-pay | Admitting: Family

## 2013-10-11 DIAGNOSIS — H547 Unspecified visual loss: Secondary | ICD-10-CM

## 2013-10-11 DIAGNOSIS — I658 Occlusion and stenosis of other precerebral arteries: Secondary | ICD-10-CM | POA: Diagnosis not present

## 2013-10-11 DIAGNOSIS — Z5181 Encounter for therapeutic drug level monitoring: Secondary | ICD-10-CM | POA: Diagnosis not present

## 2013-10-11 DIAGNOSIS — I6529 Occlusion and stenosis of unspecified carotid artery: Secondary | ICD-10-CM | POA: Diagnosis not present

## 2013-10-11 LAB — CBC WITH DIFFERENTIAL/PLATELET
Basophils Absolute: 0 10*3/uL (ref 0.0–0.1)
Basophils Relative: 0 % (ref 0–1)
EOS ABS: 0.1 10*3/uL (ref 0.0–0.7)
EOS PCT: 2 % (ref 0–5)
HCT: 39.1 % (ref 36.0–46.0)
HEMOGLOBIN: 13.5 g/dL (ref 12.0–15.0)
Lymphocytes Relative: 37 % (ref 12–46)
Lymphs Abs: 2.1 10*3/uL (ref 0.7–4.0)
MCH: 30.3 pg (ref 26.0–34.0)
MCHC: 34.5 g/dL (ref 30.0–36.0)
MCV: 87.9 fL (ref 78.0–100.0)
MONOS PCT: 11 % (ref 3–12)
Monocytes Absolute: 0.6 10*3/uL (ref 0.1–1.0)
Neutro Abs: 2.9 10*3/uL (ref 1.7–7.7)
Neutrophils Relative %: 50 % (ref 43–77)
PLATELETS: 211 10*3/uL (ref 150–400)
RBC: 4.45 MIL/uL (ref 3.87–5.11)
RDW: 14.6 % (ref 11.5–15.5)
WBC: 5.7 10*3/uL (ref 4.0–10.5)

## 2013-10-11 LAB — SEDIMENTATION RATE: Sed Rate: 11 mm/hr (ref 0–22)

## 2013-10-11 NOTE — Telephone Encounter (Signed)
Went to Dr Bing Plume  He wants her to take preservision areds 2 formula 2 per day for her eyes.  She takes Centrum Silver  b12 2500 mg vitamin c 240 mg vitamin e 300 mg diltazem 120 mg warfarin 5mg    Can she take the preservision it has vit c vit e zinc copper lutein and zeaxanthin

## 2013-10-12 LAB — C-REACTIVE PROTEIN: CRP: <0.5 mg/dL (ref <0.60)

## 2013-10-12 NOTE — Telephone Encounter (Signed)
Notified pt and updated medication list.

## 2013-10-12 NOTE — Telephone Encounter (Signed)
If she starts the [place], then I would recommend that she stop vit C, Vit E and centrum.

## 2013-10-15 DIAGNOSIS — N3941 Urge incontinence: Secondary | ICD-10-CM | POA: Diagnosis not present

## 2013-10-25 DIAGNOSIS — N3941 Urge incontinence: Secondary | ICD-10-CM | POA: Diagnosis not present

## 2013-10-28 ENCOUNTER — Other Ambulatory Visit: Payer: Self-pay | Admitting: *Deleted

## 2013-10-28 MED ORDER — WARFARIN SODIUM 5 MG PO TABS: ORAL_TABLET | ORAL | Status: DC

## 2013-11-03 ENCOUNTER — Ambulatory Visit (INDEPENDENT_AMBULATORY_CARE_PROVIDER_SITE_OTHER): Payer: Medicare Other | Admitting: *Deleted

## 2013-11-03 DIAGNOSIS — I4891 Unspecified atrial fibrillation: Secondary | ICD-10-CM

## 2013-11-03 DIAGNOSIS — Z7901 Long term (current) use of anticoagulants: Secondary | ICD-10-CM | POA: Diagnosis not present

## 2013-11-03 DIAGNOSIS — Z5181 Encounter for therapeutic drug level monitoring: Secondary | ICD-10-CM

## 2013-11-03 LAB — POCT INR: INR: 3

## 2013-11-04 ENCOUNTER — Other Ambulatory Visit: Payer: Self-pay | Admitting: Cardiology

## 2013-11-08 ENCOUNTER — Telehealth: Payer: Self-pay | Admitting: *Deleted

## 2013-11-08 DIAGNOSIS — N3941 Urge incontinence: Secondary | ICD-10-CM | POA: Diagnosis not present

## 2013-11-08 MED ORDER — CHLOROQUINE PHOSPHATE 500 MG PO TABS: ORAL_TABLET | ORAL | Status: DC

## 2013-11-08 NOTE — Telephone Encounter (Signed)
Left message on home # to return my call. 

## 2013-11-08 NOTE — Telephone Encounter (Signed)
Received message from pt that she is going on a mission trip in July to [place]. Pt has already started checking into vaccinations needed for travel but wanted to check with [place] to see what we have available. Also requests Rxs to keep on hand for malaria / typhoid?  Attempted to reach pt and refer her to the [facility] travel clinic at [phone] for immunizations needed but was unable to leave message.  Please advise re: Rxs.

## 2013-11-08 NOTE — Telephone Encounter (Signed)
She can double check with health department re: typhoid recommendations, but the only recommendation I can find is that she complete typhoid vaccine which should be provided by the health department. To help prevent illness she should follow recommended food and water precautions.  I have sent rx for chloroquine to her pharmacy for her to start 2 weeks prior to her trip.

## 2013-11-09 ENCOUNTER — Telehealth: Payer: Self-pay | Admitting: *Deleted

## 2013-11-09 NOTE — Telephone Encounter (Signed)
Received call from [place] that chloroquine is on undetermined manufacturer backorder.  Please advise.

## 2013-11-09 NOTE — Telephone Encounter (Signed)
Received call from pt stating Dr Estill Dooms is monitoring her urine output. Has been treating her urinary incontinence with botox injections. States that she has intermittent swelling in her feet/legs. Pt reports that she voided 6 1/2 cups of urine through the night last night and has reported this to Dr Estill Dooms and will keep [place] informed of the outcome. Pt just wanted [place] to be aware.

## 2013-11-09 NOTE — Telephone Encounter (Signed)
She should be seen for follow up if swelling does not improve.

## 2013-11-10 NOTE — Telephone Encounter (Signed)
Carla Curtis from [employer] [facility] [employer] called regarding the status of this.

## 2013-11-11 MED ORDER — ATOVAQUONE-PROGUANIL HCL 250-100 MG PO TABS
ORAL_TABLET | ORAL | Status: DC
Start: 2013-11-11 — End: 2014-01-31

## 2013-11-11 NOTE — Telephone Encounter (Signed)
Please notify pharmacy to cancel rx for cholorquine.  Instead, I have sent rx for malarone. Please notify fpt.

## 2013-11-11 NOTE — Telephone Encounter (Signed)
Rx request faxed to pharmacy; [facility] with contact name and number [for return call, if needed] RE: new RX per provider instructions/SLS

## 2013-11-11 NOTE — Telephone Encounter (Signed)
LMOM with contact name and number [for return call, if needed] RE: provider instructions for continued swelling/SLS

## 2013-11-19 ENCOUNTER — Ambulatory Visit (INDEPENDENT_AMBULATORY_CARE_PROVIDER_SITE_OTHER): Payer: Medicare Other | Admitting: *Deleted

## 2013-11-19 ENCOUNTER — Telehealth: Payer: Self-pay | Admitting: *Deleted

## 2013-11-19 DIAGNOSIS — Z23 Encounter for immunization: Secondary | ICD-10-CM

## 2013-11-19 NOTE — Telephone Encounter (Signed)
Pt is going to [place] on a mission trip at the end of July. We prescribed her prophylactic med for malaria (malarone). Pharmacy says medication could interfere with her PT/INR levels. Pt will be on trip from 7/14 to 12/31/13.  Please advise.

## 2013-11-19 NOTE — Telephone Encounter (Signed)
Please refer question to her primary care Kirk Ruths

## 2013-11-19 NOTE — Telephone Encounter (Signed)
Left detailed message on home # to call and arrange nurse visit for accelerated dosing schedule as below.

## 2013-11-19 NOTE — Telephone Encounter (Signed)
Received message from pt that she needs to get Hep A/B vaccine (twinrix) prior to travel. Please advise if ok to arrange nurse visit for injections? Since travel is supposed to be end of July should we follow the rapid schedule of injections?

## 2013-11-19 NOTE — Telephone Encounter (Signed)
Yes, please follow rapid schedule: 0, 1 week, 3, weeks, 1 year injections.

## 2013-11-23 NOTE — Telephone Encounter (Signed)
Forwarded to [facility] and Debbrah Alar NP

## 2013-11-23 NOTE — Telephone Encounter (Signed)
Returned call to pt, discussed with Alferd Apa, PharmD hasn't seen medication Malarone adversely effect Coumadin in the past.  Pt has been instructed to take 2 days prior to leaving the country, daily while on trip and then 7 days after she returns home.  Pt is leaving for trip 2nd week of July.  Has a INR check scheduled for first week of July prior to departure.  Will check INR befor trip and then very soon after she returns.  Pt verbalized understanding.

## 2013-11-24 ENCOUNTER — Telehealth: Payer: Self-pay | Admitting: *Deleted

## 2013-11-24 ENCOUNTER — Telehealth: Payer: Self-pay | Admitting: Pharmacist

## 2013-11-24 NOTE — Telephone Encounter (Signed)
Patient is considering having a tickle liposuction procedure by Dr. Luvenia Heller in the future, however this has not been scheduled yet.  Dr. Stanford Breed is okay with patient holding warfarin 5 days prior, and restarting the following day.  I spoke with patient about this, and she agrees to call the coumadin clinic if she ever decides to move forward and schedules this procedure.

## 2013-11-24 NOTE — Telephone Encounter (Signed)
Dr Stanford Breed spoke with dr best. Faythe Ghee given for pt to hold her coumadin 5 days prior to procedure and to restart the day after at the previous dosage.

## 2013-11-26 ENCOUNTER — Ambulatory Visit (INDEPENDENT_AMBULATORY_CARE_PROVIDER_SITE_OTHER): Payer: Medicare Other | Admitting: *Deleted

## 2013-11-26 ENCOUNTER — Ambulatory Visit: Payer: Medicare Other

## 2013-11-26 DIAGNOSIS — Z23 Encounter for immunization: Secondary | ICD-10-CM

## 2013-11-26 NOTE — Progress Notes (Signed)
   Subjective:    Patient ID: Carla Curtis, female    DOB: 12/31/1932, 80 y.o.   MRN: [id number]  HPI    Review of Systems     Objective:   Physical Exam        Assessment & Plan:  After obtaining consent, and per orders of Debbrah Alar, FNP, injection of TwinRix [Hep A & B] given by SHARON SCATES. Patient tolerated injection in Right Deltoid well/SLS .

## 2013-12-08 ENCOUNTER — Ambulatory Visit (INDEPENDENT_AMBULATORY_CARE_PROVIDER_SITE_OTHER): Payer: Medicare Other | Admitting: *Deleted

## 2013-12-08 DIAGNOSIS — Z23 Encounter for immunization: Secondary | ICD-10-CM | POA: Diagnosis not present

## 2013-12-09 ENCOUNTER — Ambulatory Visit: Payer: Medicare Other

## 2013-12-15 ENCOUNTER — Ambulatory Visit (INDEPENDENT_AMBULATORY_CARE_PROVIDER_SITE_OTHER): Payer: Medicare Other | Admitting: *Deleted

## 2013-12-15 DIAGNOSIS — Z5181 Encounter for therapeutic drug level monitoring: Secondary | ICD-10-CM

## 2013-12-15 DIAGNOSIS — I4891 Unspecified atrial fibrillation: Secondary | ICD-10-CM

## 2013-12-15 DIAGNOSIS — Z7901 Long term (current) use of anticoagulants: Secondary | ICD-10-CM

## 2013-12-15 LAB — POCT INR: INR: 2.4

## 2014-01-05 ENCOUNTER — Other Ambulatory Visit: Payer: Self-pay | Admitting: Cardiology

## 2014-01-05 DIAGNOSIS — J019 Acute sinusitis, unspecified: Secondary | ICD-10-CM | POA: Diagnosis not present

## 2014-01-26 ENCOUNTER — Ambulatory Visit (INDEPENDENT_AMBULATORY_CARE_PROVIDER_SITE_OTHER): Payer: Medicare Other | Admitting: Pharmacist

## 2014-01-26 DIAGNOSIS — Z5181 Encounter for therapeutic drug level monitoring: Secondary | ICD-10-CM | POA: Diagnosis not present

## 2014-01-26 DIAGNOSIS — I4891 Unspecified atrial fibrillation: Secondary | ICD-10-CM

## 2014-01-26 DIAGNOSIS — Z7901 Long term (current) use of anticoagulants: Secondary | ICD-10-CM

## 2014-01-26 LAB — POCT INR: INR: 2.6

## 2014-01-31 ENCOUNTER — Encounter: Payer: Self-pay | Admitting: Family

## 2014-01-31 ENCOUNTER — Telehealth: Payer: Self-pay | Admitting: Family

## 2014-01-31 ENCOUNTER — Ambulatory Visit (INDEPENDENT_AMBULATORY_CARE_PROVIDER_SITE_OTHER): Payer: Medicare Other | Admitting: Family

## 2014-01-31 VITALS — BP 110/80 | HR 64 | Temp 98.0°F | Resp 16 | Ht 65.5 in | Wt 176.0 lb

## 2014-01-31 DIAGNOSIS — I480 Paroxysmal atrial fibrillation: Secondary | ICD-10-CM

## 2014-01-31 DIAGNOSIS — I4891 Unspecified atrial fibrillation: Secondary | ICD-10-CM

## 2014-01-31 DIAGNOSIS — I10 Essential (primary) hypertension: Secondary | ICD-10-CM

## 2014-01-31 DIAGNOSIS — Z23 Encounter for immunization: Secondary | ICD-10-CM

## 2014-01-31 DIAGNOSIS — R42 Dizziness and giddiness: Secondary | ICD-10-CM | POA: Diagnosis not present

## 2014-01-31 DIAGNOSIS — R079 Chest pain, unspecified: Secondary | ICD-10-CM | POA: Diagnosis not present

## 2014-01-31 DIAGNOSIS — R0789 Other chest pain: Secondary | ICD-10-CM | POA: Diagnosis not present

## 2014-01-31 LAB — CBC WITH DIFFERENTIAL/PLATELET
BASOS ABS: 0 10*3/uL (ref 0.0–0.1)
Basophils Relative: 0 % (ref 0–1)
EOS PCT: 2 % (ref 0–5)
Eosinophils Absolute: 0.1 10*3/uL (ref 0.0–0.7)
HCT: 39.2 % (ref 36.0–46.0)
Hemoglobin: 13.4 g/dL (ref 12.0–15.0)
LYMPHS ABS: 2.9 10*3/uL (ref 0.7–4.0)
Lymphocytes Relative: 42 % (ref 12–46)
MCH: 30.4 pg (ref 26.0–34.0)
MCHC: 34.2 g/dL (ref 30.0–36.0)
MCV: 88.9 fL (ref 78.0–100.0)
MONO ABS: 0.8 10*3/uL (ref 0.1–1.0)
Monocytes Relative: 12 % (ref 3–12)
Neutro Abs: 3 10*3/uL (ref 1.7–7.7)
Neutrophils Relative %: 44 % (ref 43–77)
Platelets: 244 10*3/uL (ref 150–400)
RBC: 4.41 MIL/uL (ref 3.87–5.11)
RDW: 14.9 % (ref 11.5–15.5)
WBC: 6.9 10*3/uL (ref 4.0–10.5)

## 2014-01-31 LAB — TSH: TSH: 1.678 u[IU]/mL (ref 0.350–4.500)

## 2014-01-31 NOTE — Patient Instructions (Signed)
Please keep your upcoming appointment with Dr. Stanford Breed. Go to the ER if you develop recurrent chest pain. Follow up with [place] in 3 months.

## 2014-01-31 NOTE — Telephone Encounter (Signed)
Relevant patient education assigned to patient using Emmi. ° °

## 2014-01-31 NOTE — Assessment & Plan Note (Signed)
?   Mild vertigo symptoms.  Normal orthostatics, NSR on EKG, obtain TSH, CBC.  Monitor.

## 2014-01-31 NOTE — Progress Notes (Signed)
Subjective:    Patient ID: Carla Curtis, female    DOB: Jul 25, 1932, 80 y.o.   MRN: [id number]  HPI  Carla Curtis is a 80 yr old female who presents today for follow up.  1) HTN- Currently maintained on diltiazem.   BP Readings from Last 3 Encounters:  01/31/14 110/80  10/05/13 132/82  09/17/13 123/52   2) Dizziness-She reports that she was in [place] 1 month ago for a Missionary trip. During that time, she had a URI and experienced moderate dizziness.  Since that time her dizziness has improved but notes that at time she feels light headed  and unsteady on her feet.   3) Chest pain- Reports 2 episodes of chest pain since she returned 1 month ago from [place]. Reports CP has been intermittent for "several years." Denies current chest pain. Reports that chest pain lasts 10 minutes and often occurs at rest and usually resolves with glass cold water. Reports easy fatigue.  Poor energy.  Has apt upcoming with Dr. Stanford Breed.    Review of Systems    see HPI  Past Medical History  Diagnosis Date  . Normal cardiac stress test 01/27/09    lvef 73% done at [facility] (nuclear stress)  . Atrial fibrillation     on Coumadin since mid 2010  . HTN (hypertension)   . Incontinence   . Vitamin D deficiency 12/13/2011  . Squamous cell carcinoma 10/14    left side of nose    History   Social History  . Marital Status: Widowed    Spouse Name: N/A    Number of Children: 2  . Years of Education: N/A   Occupational History  .     Social History Main Topics  . Smoking status: Never Smoker   . Smokeless tobacco: Never Used  . Alcohol Use: Yes     Comment: rare- 1 drink/ month  . Drug Use: Not on file  . Sexual Activity: No   Other Topics Concern  . Not on file   Social History Narrative   Regular Exercise- no    Past Surgical History  Procedure Laterality Date  . Laminectomy  1989 and 1990    L4, L5  . Bilateral mammoplasty    . Tonsillectomy and adenoidectomy  1941  .  Abdominal hysterectomy  2007  . Arm skin lesion biopsy / excision      pre-cancerous    Family History  Problem Relation Age of Onset  . Hypertension Mother   . COPD Father     smoker  . Cancer Father     lung  . Cancer Sister     breast  . Bell's palsy Sister   . Heart disease Sister     CHF  . Cancer Daughter     breast/ stage 1- s/p lumpectomy and radiation  . Cancer Other     Hodgkins Disease  . Cancer Daughter     No Known Allergies  Current Outpatient Prescriptions on File Prior to Visit  Medication Sig Dispense Refill  . cholecalciferol (VITAMIN D) 1000 UNITS tablet Take 3,000 Units by mouth daily.      . Cranberry 250 MG CAPS Take 2 capsules by mouth every morning.        . diltiazem (CARDIZEM) 120 MG tablet TAKE 1 TABLET BY MOUTH ONCE DAILY  30 tablet  0  . Multiple Vitamins-Minerals (PRESERVISION AREDS 2) CAPS Take 2 capsules by mouth daily.       Marland Kitchen  warfarin (COUMADIN) 5 MG tablet 1 tablet everyday except 1/2 tablet on Mondays and Fridays or as directed by coumadin clinic  90 tablet  1   No current facility-administered medications on file prior to visit.    BP 110/80  Pulse 64  Temp(Src) 98 F (36.7 C) (Oral)  Resp 16  Ht 5' 5.5" (1.664 m)  Wt 176 lb 0.6 oz (79.851 kg)  BMI 28.84 kg/m2  SpO2 98%    Objective:   Physical Exam  Constitutional: She is oriented to person, place, and time. She appears well-developed and well-nourished. No distress.  HENT:  Head: Normocephalic and atraumatic.  Right Ear: Tympanic membrane and ear canal normal.  Left Ear: Tympanic membrane and ear canal normal.  Cardiovascular: Normal rate and regular rhythm.   No murmur heard. Pulmonary/Chest: Effort normal and breath sounds normal. No respiratory distress. She has no wheezes. She has no rales. She exhibits no tenderness.  Musculoskeletal: She exhibits no edema.  Neurological: She is alert and oriented to person, place, and time.  Psychiatric: She has a normal mood  and affect. Her behavior is normal. Judgment and thought content normal.          Assessment & Plan:  Flu shot today.

## 2014-01-31 NOTE — Assessment & Plan Note (Signed)
EKG is performed today in office.  Notes NSR without acute ischemic changes. She has upcoming apt on 9/9 and I have advised her to keep this appointment and discuss with him. Will defer further work up to cardiology.  In the meantime, if she develops recurrent chest pain between now and her apt with cardiology- I have advised her to go directly to the ED and she verbalizes understanding.

## 2014-01-31 NOTE — Progress Notes (Signed)
Pre visit review using our clinic review tool, if applicable. No additional management support is needed unless otherwise documented below in the visit note. 

## 2014-01-31 NOTE — Assessment & Plan Note (Signed)
BP is stable. Continue current dose of diltiazem.

## 2014-02-07 ENCOUNTER — Other Ambulatory Visit: Payer: Self-pay

## 2014-02-07 ENCOUNTER — Other Ambulatory Visit: Payer: Self-pay | Admitting: Cardiology

## 2014-02-07 MED ORDER — DILTIAZEM HCL 120 MG PO TABS: ORAL_TABLET | ORAL | Status: DC

## 2014-02-16 ENCOUNTER — Ambulatory Visit: Payer: Medicare Other | Admitting: Cardiology

## 2014-02-21 ENCOUNTER — Ambulatory Visit (INDEPENDENT_AMBULATORY_CARE_PROVIDER_SITE_OTHER): Payer: Medicare Other | Admitting: Physician Assistant

## 2014-02-21 ENCOUNTER — Telehealth: Payer: Self-pay | Admitting: Cardiology

## 2014-02-21 ENCOUNTER — Encounter: Payer: Self-pay | Admitting: Physician Assistant

## 2014-02-21 VITALS — BP 115/74 | HR 68 | Ht 65.5 in | Wt 175.8 lb

## 2014-02-21 DIAGNOSIS — I10 Essential (primary) hypertension: Secondary | ICD-10-CM

## 2014-02-21 DIAGNOSIS — R0609 Other forms of dyspnea: Secondary | ICD-10-CM

## 2014-02-21 DIAGNOSIS — Z8679 Personal history of other diseases of the circulatory system: Secondary | ICD-10-CM | POA: Diagnosis not present

## 2014-02-21 DIAGNOSIS — I4891 Unspecified atrial fibrillation: Secondary | ICD-10-CM | POA: Diagnosis not present

## 2014-02-21 DIAGNOSIS — R079 Chest pain, unspecified: Secondary | ICD-10-CM

## 2014-02-21 DIAGNOSIS — R0989 Other specified symptoms and signs involving the circulatory and respiratory systems: Secondary | ICD-10-CM

## 2014-02-21 NOTE — Telephone Encounter (Signed)
Appointment scheduled for 03/16/2014 @ 9:45, pt notified

## 2014-02-21 NOTE — Assessment & Plan Note (Signed)
Patient remains in normal sinus rhythm. She cannot tell when she is in atrial fibrillation. Continue on diltiazem and Coumadin.

## 2014-02-21 NOTE — Assessment & Plan Note (Addendum)
Patient has history of rest chest pain that is relieved with a glass of cold ice water. No exertional chest pain. Check fasting lipid panel to

## 2014-02-21 NOTE — Assessment & Plan Note (Addendum)
Patient has history of dyspnea on exertion. Recommend Lexiscan Myoview to rule out ischemia. Followup with Dr. Stanford Breed.

## 2014-02-21 NOTE — Telephone Encounter (Signed)
[place] can be [place] in [place] in one of the end slots on 03-16-14

## 2014-02-21 NOTE — Telephone Encounter (Signed)
She wants this pt to be seen here or in the [employer] she goes out of the country,

## 2014-02-21 NOTE — Patient Instructions (Signed)
Your physician has requested that you have a lexiscan myoview. For further information please visit [email or web address]. Please follow instruction sheet, as given.  Your physician recommends that you return for a FASTING lipid profile:On the day of Stress test  Your physician recommends that you continue on your current medications as directed. Please refer to the Current Medication list given to you today.   Your physician recommends that you schedule a follow-up appointment :with DR. Crenshaw before October 12th 2015

## 2014-02-21 NOTE — Progress Notes (Signed)
HPI: This is a 80 year old female patient Dr. Stanford Breed who has paroxysmal atrial fibrillation treated with Cardizem and Coumadin in [place] before moving here. She also has a history of atypical chest pain. She apparently had a stress test in [facility] that was negative.  Patient recently traveled to [place] on a mission trip and has been feeling poorly since then. She did develop an infection while there and has had a hard time recovering. She says prior to the mission trip she was having trouble with dyspnea on exertion with little activity. She has trouble going up a flight of stairs and seems to have to pull herself up. She can't walk far without becoming short of breath. She denies any exertional chest pain. She does have resting chest pressure that occurs at various times. This usually goes away with a glass of cold ice water. She said she was given nitroglycerin years ago and told her chest pain was from her arrhythmia. She says the ice water work better than the nitroglycerin.  No Known Allergies   Current Outpatient Prescriptions  Medication Sig Dispense Refill  . cholecalciferol (VITAMIN D) 1000 UNITS tablet Take 3,000 Units by mouth daily.      . Cyanocobalamin (B-12 SL) Place 1 each under the tongue daily.      Marland Kitchen diltiazem (CARDIZEM) 120 MG tablet TAKE 1 TABLET BY MOUTH ONCE DAILY  30 tablet  0  . Multiple Vitamins-Minerals (PRESERVISION AREDS 2) CAPS Take 2 capsules by mouth daily.       Marland Kitchen OVER THE COUNTER MEDICATION RUBY RED  1 capful in juice.      Marland Kitchen OVER THE COUNTER MEDICATION ARTIC OIL (krill oil). Take 1 a day.      Marland Kitchen VITAMIN E PO Take 1 capsule by mouth daily.      Marland Kitchen warfarin (COUMADIN) 5 MG tablet 1 tablet everyday except 1/2 tablet on Mondays and Fridays or as directed by coumadin clinic  90 tablet  1   No current facility-administered medications for this visit.    Past Medical History  Diagnosis Date  . Normal cardiac stress test 01/27/09    lvef 73%  done at [facility] (nuclear stress)  . Atrial fibrillation     on Coumadin since mid 2010  . HTN (hypertension)   . Incontinence   . Vitamin D deficiency 12/13/2011  . Squamous cell carcinoma 10/14    left side of nose    Past Surgical History  Procedure Laterality Date  . Laminectomy  1989 and 1990    L4, L5  . Bilateral mammoplasty    . Tonsillectomy and adenoidectomy  1941  . Abdominal hysterectomy  2007  . Arm skin lesion biopsy / excision      pre-cancerous    Family History  Problem Relation Age of Onset  . Hypertension Mother   . COPD Father     smoker  . Cancer Father     lung  . Cancer Sister     breast  . Bell's palsy Sister   . Heart disease Sister     CHF  . Cancer Daughter     breast/ stage 1- s/p lumpectomy and radiation  . Cancer Other     Hodgkins Disease  . Cancer Daughter     History   Social History  . Marital Status: Widowed    Spouse Name: N/A    Number of Children: 2  . Years of Education: N/A   Occupational History  .  Social History Main Topics  . Smoking status: Never Smoker   . Smokeless tobacco: Never Used  . Alcohol Use: Yes     Comment: rare- 1 drink/ month  . Drug Use: Not on file  . Sexual Activity: No   Other Topics Concern  . Not on file   Social History Narrative   Regular Exercise- no    ROS: See history of present illness otherwise negative  Ht 5' 5.5" (1.664 m)  Wt 79.742 kg (175 lb 12.8 oz)  BMI 28.80 kg/m2  PHYSICAL EXAM: Well-nournished, in no acute distress. Neck: No JVD, HJR, Bruit, or thyroid enlargement  Lungs: No tachypnea, clear without wheezing, rales, or rhonchi  Cardiovascular: RRR, PMI not displaced, heart sounds normal, no murmurs, gallops, bruit, thrill, or heave.  Abdomen: BS normal. Soft without organomegaly, masses, lesions or tenderness.  Extremities: without cyanosis, clubbing or edema. Good distal pulses bilateral  SKin: Warm, no lesions or rashes   Musculoskeletal: No  deformities  Neuro: no focal signs   Wt Readings from Last 3 Encounters:  02/21/14 79.742 kg (175 lb 12.8 oz)  01/31/14 79.851 kg (176 lb 0.6 oz)  10/05/13 80.305 kg (177 lb 0.6 oz)     EKG: EKG reviewed from 01/31/14 showing normal sinus rhythm with no acute change.  2-D echo 02/08/11: Study Conclusions  - Left ventricle: The cavity size was normal. Wall thickness was   increased in a pattern of mild LVH. Systolic function was normal.   The estimated ejection fraction was in the range of 60% to 65%.   Wall motion was normal; there were no regional wall motion   abnormalities. - Left atrium: The atrium was moderately dilated.

## 2014-03-07 ENCOUNTER — Ambulatory Visit (INDEPENDENT_AMBULATORY_CARE_PROVIDER_SITE_OTHER): Payer: Medicare Other | Admitting: *Deleted

## 2014-03-07 ENCOUNTER — Other Ambulatory Visit: Payer: Self-pay | Admitting: *Deleted

## 2014-03-07 ENCOUNTER — Other Ambulatory Visit (INDEPENDENT_AMBULATORY_CARE_PROVIDER_SITE_OTHER): Payer: Medicare Other | Admitting: *Deleted

## 2014-03-07 ENCOUNTER — Ambulatory Visit (HOSPITAL_COMMUNITY): Payer: Medicare Other | Attending: Cardiology | Admitting: Radiology

## 2014-03-07 VITALS — BP 122/57 | Ht 65.0 in | Wt 176.0 lb

## 2014-03-07 DIAGNOSIS — I10 Essential (primary) hypertension: Secondary | ICD-10-CM

## 2014-03-07 DIAGNOSIS — I4891 Unspecified atrial fibrillation: Secondary | ICD-10-CM | POA: Diagnosis not present

## 2014-03-07 DIAGNOSIS — Z978 Presence of other specified devices: Secondary | ICD-10-CM | POA: Diagnosis not present

## 2014-03-07 DIAGNOSIS — Z5181 Encounter for therapeutic drug level monitoring: Secondary | ICD-10-CM

## 2014-03-07 DIAGNOSIS — Z7901 Long term (current) use of anticoagulants: Secondary | ICD-10-CM | POA: Diagnosis not present

## 2014-03-07 DIAGNOSIS — R0789 Other chest pain: Secondary | ICD-10-CM | POA: Insufficient documentation

## 2014-03-07 DIAGNOSIS — R0989 Other specified symptoms and signs involving the circulatory and respiratory systems: Secondary | ICD-10-CM

## 2014-03-07 DIAGNOSIS — R0609 Other forms of dyspnea: Secondary | ICD-10-CM

## 2014-03-07 LAB — POCT INR: INR: 3.5

## 2014-03-07 LAB — LIPID PANEL
CHOL/HDL RATIO: 3
Cholesterol: 212 mg/dL — ABNORMAL HIGH (ref 0–200)
HDL: 68.9 mg/dL (ref >39.00)
LDL CALC: 125 mg/dL — AB (ref 0–99)
NonHDL: 143.1
TRIGLYCERIDES: 92 mg/dL (ref 0.0–149.0)
VLDL: 18.4 mg/dL (ref 0.0–40.0)

## 2014-03-07 MED ORDER — TECHNETIUM TC 99M SESTAMIBI GENERIC - CARDIOLITE
11.0000 | Freq: Once | INTRAVENOUS | Status: AC | PRN
  Administered 2014-03-07: 11 via INTRAVENOUS

## 2014-03-07 MED ORDER — TECHNETIUM TC 99M SESTAMIBI GENERIC - CARDIOLITE
33.0000 | Freq: Once | INTRAVENOUS | Status: AC | PRN
  Administered 2014-03-07: 33 via INTRAVENOUS

## 2014-03-07 MED ORDER — REGADENOSON 0.4 MG/5ML IV SOLN
0.4000 mg | Freq: Once | INTRAVENOUS | Status: AC
  Administered 2014-03-07: 0.4 mg via INTRAVENOUS

## 2014-03-07 NOTE — Progress Notes (Signed)
[facility] [place], [place] [address] [phone]    Cardiology Nuclear Med Study  Carla Curtis is a 80 y.o. female     MRN : [id number]     DOB: [birth year]  Procedure Date: 03/07/2014  Nuclear Med Background Indication for Stress Test:  Evaluation for Ischemia History:  08/09 MPI: CA NL per pt  Bilateral Silicone implants AFIB  Cardiac Risk Factors: Hypertension and Atrial Fib  Symptoms:  Chest Pressure and DOE   Nuclear Pre-Procedure Caffeine/Decaff Intake:  None NPO After: 8:00pm   Lungs:  clear O2 Sat: 95% on room air. IV 0.9% NS with Angio Cath:  20g  IV Site: R Antecubital  IV Started by:  Matilde Haymaker, RN  Chest Size (in):  42 Cup Size: D;silicone implants  Height: 5\' 5"  (1.651 m)  Weight:  176 lb (79.833 kg)  BMI:  Body mass index is 29.29 kg/(m^2). Tech Comments:  n/a    Nuclear Med Study 1 or 2 day study: 1 day  Stress Test Type:  Lexiscan  Reading MD: n/a  Order Authorizing Provider:  Filiberto Pinks  Resting Radionuclide: Technetium 91m Sestamibi  Resting Radionuclide Dose: 11.0 mCi   Stress Radionuclide:  Technetium 82m Sestamibi  Stress Radionuclide Dose: 33.0 mCi           Stress Protocol Rest HR: 53 Stress HR: 80  Rest BP: 122/57 Stress BP: 132/59  Exercise Time (min): n/a METS: n/a   Predicted Max HR: 141 bpm % Max HR: 56.74 bpm Rate Pressure Product: 10560   Dose of Adenosine (mg):  n/a Dose of Lexiscan: 0.4 mg  Dose of Atropine (mg): n/a Dose of Dobutamine: n/a mcg/kg/min (at max HR)  Stress Test Technologist: Perrin Maltese, EMT-P  Nuclear Technologist:  Earl Many, CNMT     Rest Procedure:  Myocardial perfusion imaging was performed at rest 45 minutes following the intravenous administration of Technetium 48m Sestamibi. Rest ECG: NSR - Normal EKG  Stress Procedure:  The patient received IV Lexiscan 0.4 mg over 15-seconds.  Technetium 77m Sestamibi injected at 30-seconds. This  patient was flushed and her legs felt achy with the Lexiscan injection. Quantitative spect images were obtained after a 45 minute delay. Stress ECG: No significant change from baseline ECG  QPS Raw Data Images:  Normal; no motion artifact; normal heart/lung ratio. Stress Images:  Normal homogeneous uptake in all areas of the myocardium. Rest Images:  Normal homogeneous uptake in all areas of the myocardium. Subtraction (SDS):  No evidence of ischemia. Transient Ischemic Dilatation (Normal <1.22):  1.02 Lung/Heart Ratio (Normal <0.45):  0.47  Quantitative Gated Spect Images QGS EDV:  59 ml QGS ESV:  16 ml  Impression Exercise Capacity:  Lexiscan with low level exercise. BP Response:  Normal blood pressure response. Clinical Symptoms:  No chest pain. ECG Impression:  No significant ST segment change suggestive of ischemia. Comparison with Prior Nuclear Study: No images to compare  Overall Impression:  Normal stress nuclear study.  LV Ejection Fraction: 74%.  LV Wall Motion:  NL LV Function; NL Wall Motion   Darlin Coco MD

## 2014-03-10 ENCOUNTER — Encounter: Payer: Self-pay | Admitting: Cardiology

## 2014-03-10 NOTE — Telephone Encounter (Signed)
Left message for pt to call.

## 2014-03-10 NOTE — Telephone Encounter (Signed)
Pt was returning call from [facility]

## 2014-03-10 NOTE — Telephone Encounter (Signed)
Returning call,concerning her stress test and lab work results.

## 2014-03-11 ENCOUNTER — Other Ambulatory Visit: Payer: Self-pay | Admitting: Cardiology

## 2014-03-14 NOTE — Telephone Encounter (Signed)
This encounter was created in error - please disregard.

## 2014-03-16 ENCOUNTER — Ambulatory Visit (INDEPENDENT_AMBULATORY_CARE_PROVIDER_SITE_OTHER): Payer: Medicare Other | Admitting: Cardiology

## 2014-03-16 ENCOUNTER — Encounter: Payer: Self-pay | Admitting: Cardiology

## 2014-03-16 VITALS — BP 134/76 | HR 72 | Ht 65.0 in | Wt 174.8 lb

## 2014-03-16 DIAGNOSIS — R0789 Other chest pain: Secondary | ICD-10-CM | POA: Diagnosis not present

## 2014-03-16 DIAGNOSIS — I480 Paroxysmal atrial fibrillation: Secondary | ICD-10-CM | POA: Diagnosis not present

## 2014-03-16 DIAGNOSIS — I10 Essential (primary) hypertension: Secondary | ICD-10-CM

## 2014-03-16 MED ORDER — DILTIAZEM HCL 120 MG PO TABS: 120.0000 mg | ORAL_TABLET | Freq: Every day | ORAL | Status: DC

## 2014-03-16 NOTE — Progress Notes (Signed)
HPI: FU atrial fibrillation. Patient from [place]. I do not have her prior cardiac records available. Pt was apparently having an echocardiogram and noted to be in atrial fibrillation. She has been on Cardizem and Coumadin since then. Also with chronic chest pain; had stress test in [facility] that apparently was neg. Echocardiogram here in August of 2012 showed normal LV function, mild left ventricular hypertrophy and moderate left atrial enlargement. Carotid Dopplers May 2015 showed no carotid stenosis. Nuclear study in September 2015 showed an ejection fraction of 74% and normal perfusion. Since last seen, the patient has dyspnea with more extreme activities but not with routine activities. It is relieved with rest. It is not associated with chest pain. There is no orthopnea, PND or pedal edema. There is no syncope or palpitations. There is no exertional chest pain.    Current Outpatient Prescriptions  Medication Sig Dispense Refill  . Ascorbic Acid (VITAMIN C) 1000 MG tablet Take 500 mg by mouth daily.      . cholecalciferol (VITAMIN D) 1000 UNITS tablet Take 3,000 Units by mouth daily.      Marland Kitchen CRANBERRY PO 25,200 mg daily      . Cyanocobalamin (B-12 SL) Place 1 each under the tongue daily.      Marland Kitchen diltiazem (CARDIZEM) 120 MG tablet TAKE 1 TABLET BY MOUTH ONCE DAILY  30 tablet  0  . Multiple Vitamins-Minerals (PRESERVISION AREDS 2) CAPS Take 2 capsules by mouth daily.       Marland Kitchen OVER THE COUNTER MEDICATION ARTIC OIL 500 mg 2 tab daily (krill oil).      Marland Kitchen VITAMIN E PO Take 1 capsule by mouth daily.      Marland Kitchen warfarin (COUMADIN) 5 MG tablet 1 tablet everyday except 1/2 tablet on Mondays and Fridays or as directed by coumadin clinic  90 tablet  1   No current facility-administered medications for this visit.     Past Medical History  Diagnosis Date  . Normal cardiac stress test 01/27/09    lvef 73% done at [facility] (nuclear stress)  . Atrial fibrillation     on Coumadin since mid 2010  .  HTN (hypertension)   . Incontinence   . Vitamin D deficiency 12/13/2011  . Squamous cell carcinoma 10/14    left side of nose    Past Surgical History  Procedure Laterality Date  . Laminectomy  1989 and 1990    L4, L5  . Bilateral mammoplasty    . Tonsillectomy and adenoidectomy  1941  . Abdominal hysterectomy  2007  . Arm skin lesion biopsy / excision      pre-cancerous    History   Social History  . Marital Status: Widowed    Spouse Name: N/A    Number of Children: 2  . Years of Education: N/A   Occupational History  .     Social History Main Topics  . Smoking status: Never Smoker   . Smokeless tobacco: Never Used  . Alcohol Use: Yes     Comment: rare- 1 drink/ month  . Drug Use: Not on file  . Sexual Activity: No   Other Topics Concern  . Not on file   Social History Narrative   Regular Exercise- no    ROS: no fevers or chills, productive cough, hemoptysis, dysphasia, odynophagia, melena, hematochezia, dysuria, hematuria, rash, seizure activity, orthopnea, PND, pedal edema, claudication. Remaining systems are negative.  Physical Exam: Well-developed well-nourished in no acute distress.  Skin is warm  and dry.  HEENT is normal.  Neck is supple.  Chest is clear to auscultation with normal expansion.  Cardiovascular exam is regular rate and rhythm.  Abdominal exam nontender or distended. No masses palpated. Extremities show no edema. neuro grossly intact

## 2014-03-16 NOTE — Assessment & Plan Note (Signed)
Blood pressure controlled. Continue present medications. 

## 2014-03-16 NOTE — Patient Instructions (Signed)
Your physician wants you to follow-up in: ONE YEAR WITH DR CRENSHAW You will receive a reminder letter in the mail two months in advance. If you don't receive a letter, please call our office to schedule the follow-up appointment.  

## 2014-03-16 NOTE — Assessment & Plan Note (Signed)
No recent symptoms. Nuclear study negative.

## 2014-03-16 NOTE — Assessment & Plan Note (Signed)
Patient remains in sinus rhythm on examination. Continue Coumadin. We discussed NOACs and she will consider. Continue Cardizem.

## 2014-03-18 ENCOUNTER — Ambulatory Visit (INDEPENDENT_AMBULATORY_CARE_PROVIDER_SITE_OTHER): Payer: Medicare Other | Admitting: Pharmacist

## 2014-03-18 DIAGNOSIS — I4891 Unspecified atrial fibrillation: Secondary | ICD-10-CM

## 2014-03-18 DIAGNOSIS — Z5181 Encounter for therapeutic drug level monitoring: Secondary | ICD-10-CM

## 2014-03-18 DIAGNOSIS — Z7901 Long term (current) use of anticoagulants: Secondary | ICD-10-CM

## 2014-03-18 DIAGNOSIS — I480 Paroxysmal atrial fibrillation: Secondary | ICD-10-CM | POA: Diagnosis not present

## 2014-03-18 LAB — POCT INR: INR: 1.5

## 2014-03-23 ENCOUNTER — Ambulatory Visit: Payer: Medicare Other | Admitting: Cardiology

## 2014-04-04 ENCOUNTER — Ambulatory Visit (INDEPENDENT_AMBULATORY_CARE_PROVIDER_SITE_OTHER): Payer: Medicare Other | Admitting: Pharmacist

## 2014-04-04 DIAGNOSIS — I4891 Unspecified atrial fibrillation: Secondary | ICD-10-CM | POA: Diagnosis not present

## 2014-04-04 DIAGNOSIS — Z5181 Encounter for therapeutic drug level monitoring: Secondary | ICD-10-CM

## 2014-04-04 DIAGNOSIS — Z7901 Long term (current) use of anticoagulants: Secondary | ICD-10-CM

## 2014-04-04 DIAGNOSIS — I480 Paroxysmal atrial fibrillation: Secondary | ICD-10-CM | POA: Diagnosis not present

## 2014-04-04 LAB — POCT INR: INR: 3.3

## 2014-04-05 ENCOUNTER — Telehealth: Payer: Self-pay | Admitting: Family

## 2014-04-05 ENCOUNTER — Ambulatory Visit (INDEPENDENT_AMBULATORY_CARE_PROVIDER_SITE_OTHER): Payer: Medicare Other | Admitting: Family

## 2014-04-05 ENCOUNTER — Encounter: Payer: Self-pay | Admitting: Family

## 2014-04-05 ENCOUNTER — Ambulatory Visit (HOSPITAL_BASED_OUTPATIENT_CLINIC_OR_DEPARTMENT_OTHER)
Admission: RE | Admit: 2014-04-05 | Discharge: 2014-04-05 | Disposition: A | Discharge status: Home / Self Care | Payer: Medicare Other | Source: Ambulatory Visit | Attending: Family | Admitting: Family

## 2014-04-05 VITALS — BP 130/70 | HR 62 | Temp 97.8°F | Resp 16 | Ht 65.5 in | Wt 172.4 lb

## 2014-04-05 DIAGNOSIS — R05 Cough: Secondary | ICD-10-CM | POA: Insufficient documentation

## 2014-04-05 DIAGNOSIS — J069 Acute upper respiratory infection, unspecified: Secondary | ICD-10-CM

## 2014-04-05 DIAGNOSIS — L989 Disorder of the skin and subcutaneous tissue, unspecified: Secondary | ICD-10-CM | POA: Diagnosis not present

## 2014-04-05 DIAGNOSIS — R0989 Other specified symptoms and signs involving the circulatory and respiratory systems: Secondary | ICD-10-CM | POA: Insufficient documentation

## 2014-04-05 DIAGNOSIS — R5383 Other fatigue: Secondary | ICD-10-CM

## 2014-04-05 LAB — CBC WITH DIFFERENTIAL/PLATELET
BASOS ABS: 0 10*3/uL (ref 0.0–0.1)
Basophils Relative: 0.3 % (ref 0.0–3.0)
EOS ABS: 0.1 10*3/uL (ref 0.0–0.7)
Eosinophils Relative: 2.3 % (ref 0.0–5.0)
HCT: 39.9 % (ref 36.0–46.0)
HEMOGLOBIN: 13 g/dL (ref 12.0–15.0)
LYMPHS PCT: 30.7 % (ref 12.0–46.0)
Lymphs Abs: 2 10*3/uL (ref 0.7–4.0)
MCHC: 32.5 g/dL (ref 30.0–36.0)
MCV: 91.2 fl (ref 78.0–100.0)
MONO ABS: 0.6 10*3/uL (ref 0.1–1.0)
Monocytes Relative: 9.7 % (ref 3.0–12.0)
NEUTROS ABS: 3.7 10*3/uL (ref 1.4–7.7)
NEUTROS PCT: 57 % (ref 43.0–77.0)
Platelets: 184 10*3/uL (ref 150.0–400.0)
RBC: 4.38 Mil/uL (ref 3.87–5.11)
RDW: 15.1 % (ref 11.5–15.5)
WBC: 6.5 10*3/uL (ref 4.0–10.5)

## 2014-04-05 LAB — URINALYSIS, ROUTINE W REFLEX MICROSCOPIC
Bilirubin Urine: NEGATIVE
Hgb urine dipstick: NEGATIVE
Ketones, ur: NEGATIVE
NITRITE: POSITIVE — AB
SPECIFIC GRAVITY, URINE: 1.015 (ref 1.000–1.030)
Total Protein, Urine: NEGATIVE
Urine Glucose: NEGATIVE
Urobilinogen, UA: 0.2 (ref 0.0–1.0)
pH: 7 (ref 5.0–8.0)

## 2014-04-05 LAB — TSH: TSH: 1.87 u[IU]/mL (ref 0.35–4.50)

## 2014-04-05 MED ORDER — CIPROFLOXACIN HCL 250 MG PO TABS: 250.0000 mg | ORAL_TABLET | Freq: Two times a day (BID) | ORAL | Status: DC

## 2014-04-05 NOTE — Patient Instructions (Signed)
Please complete lab work prior to leaving. Complete chest x ray on the first floor. Call if symptoms worsen or if symptoms are not improved in 1 week.  You may use tylenol as needed and make sure that you are drinking plenty of fluids.

## 2014-04-05 NOTE — Telephone Encounter (Signed)
Notified pt and she voices understanding. 

## 2014-04-05 NOTE — Telephone Encounter (Signed)
Urinalysis suggests UTI, cxr is neg for pneumonia. Blood count and thyroid look good. I would like to treat her with cipro for UTI, call if fatigue worsens or if it does not improve after completion of cipro.  She will need follow up in the coumadin clinic due to abx. I will forward this to coumadin clinic so that they can arrange a follow up Pt/INR .

## 2014-04-05 NOTE — Assessment & Plan Note (Signed)
Advised pt to follow up with her dermatologist for evaluation of the lesions on her chest.

## 2014-04-05 NOTE — Telephone Encounter (Signed)
LMOM for pt.  INR was slightly elevated yesterday.  She is only going to take Cipro for 3 days.  Will have her skip Coumadin tonight then resume same dose.  Plan to follow up in 2 weeks as previously scheduled.

## 2014-04-05 NOTE — Progress Notes (Signed)
Pre visit review using our clinic review tool, if applicable. No additional management support is needed unless otherwise documented below in the visit note. 

## 2014-04-05 NOTE — Progress Notes (Signed)
Subjective:    Patient ID: Carla Curtis, female    DOB: 1933-05-14, 80 y.o.   MRN: [id number]  HPI  Carla Curtis is a 80 yr old female who presents today with complaint of fatigue. Went to the [place]- 10/12-10/23. Reports that she developed sore throat on 10/15.  Reports that she had associated aches, nasal congestion. Mild chest congestion.  Reports that symptoms have "eased off." But wants to "sleep all day."  Reports mild neck stiffness, improved after she saw chiropractor and applied ice.     Review of Systems See HPI  Past Medical History  Diagnosis Date  . Normal cardiac stress test 01/27/09    lvef 73% done at [facility] (nuclear stress)  . Atrial fibrillation     on Coumadin since mid 2010  . HTN (hypertension)   . Incontinence   . Vitamin D deficiency 12/13/2011  . Squamous cell carcinoma 10/14    left side of nose    History   Social History  . Marital Status: Widowed    Spouse Name: N/A    Number of Children: 2  . Years of Education: N/A   Occupational History  .     Social History Main Topics  . Smoking status: Never Smoker   . Smokeless tobacco: Never Used  . Alcohol Use: Yes     Comment: rare- 1 drink/ month  . Drug Use: Not on file  . Sexual Activity: No   Other Topics Concern  . Not on file   Social History Narrative   Regular Exercise- no    Past Surgical History  Procedure Laterality Date  . Laminectomy  1989 and 1990    L4, L5  . Bilateral mammoplasty    . Tonsillectomy and adenoidectomy  1941  . Abdominal hysterectomy  2007  . Arm skin lesion biopsy / excision      pre-cancerous    Family History  Problem Relation Age of Onset  . Hypertension Mother   . COPD Father     smoker  . Cancer Father     lung  . Cancer Sister     breast  . Bell's palsy Sister   . Heart disease Sister     CHF  . Cancer Daughter     breast/ stage 1- s/p lumpectomy and radiation  . Cancer Other     Hodgkins Disease  . Cancer Daughter     No  Known Allergies  Current Outpatient Prescriptions on File Prior to Visit  Medication Sig Dispense Refill  . Ascorbic Acid (VITAMIN C) 1000 MG tablet Take 500 mg by mouth daily.      . cholecalciferol (VITAMIN D) 1000 UNITS tablet Take 3,000 Units by mouth daily.      . Cyanocobalamin (B-12 SL) Place 1 each under the tongue daily.      Marland Kitchen diltiazem (CARDIZEM) 120 MG tablet Take 1 tablet (120 mg total) by mouth daily.  90 tablet  3  . Multiple Vitamins-Minerals (PRESERVISION AREDS 2) CAPS Take 2 capsules by mouth daily.       Marland Kitchen VITAMIN E PO Take 1 capsule by mouth daily.      Marland Kitchen warfarin (COUMADIN) 5 MG tablet 1 tablet everyday except 1/2 tablet on Mondays and Fridays or as directed by coumadin clinic  90 tablet  1  . CRANBERRY PO 25,200 mg daily      . OVER THE COUNTER MEDICATION ARTIC OIL 500 mg 2 tab daily (krill oil).  No current facility-administered medications on file prior to visit.    BP 130/70  Pulse 62  Temp(Src) 97.8 F (36.6 C) (Oral)  Resp 16  Ht 5' 5.5" (1.664 m)  Wt 172 lb 6.4 oz (78.2 kg)  BMI 28.24 kg/m2  SpO2 97%       Objective:   Physical Exam  Constitutional: She is oriented to person, place, and time. She appears well-developed and well-nourished. No distress.  HENT:  Head: Normocephalic and atraumatic.  Eyes: No scleral icterus.  Cardiovascular: Normal rate and regular rhythm.   No murmur heard. Pulmonary/Chest: Effort normal and breath sounds normal. No respiratory distress. She has no wheezes. She has no rales. She exhibits no tenderness.  Musculoskeletal: She exhibits no edema.  Neurological: She is alert and oriented to person, place, and time.  Skin: Skin is warm and dry.  Few raised, pink/dry lesions on anterior chest wall  Psychiatric: She has a normal mood and affect. Her behavior is normal. Judgment and thought content normal.          Assessment & Plan:

## 2014-04-05 NOTE — Addendum Note (Signed)
Addended by: Modena Morrow D on: 04/05/2014 08:24 AM   Modules accepted: Orders

## 2014-04-05 NOTE — Assessment & Plan Note (Signed)
Symptoms initially sounded viral, however she has not improved as we had expected symptoms persist.  Will obtain CXR to exclude pna, Urinalysis/culture to exclude infection.   Advised pt:   Call if symptoms worsen or if symptoms are not improved in 1 week.  You may use tylenol as needed and make sure that you are drinking plenty of fluids.

## 2014-04-08 LAB — URINE CULTURE: Colony Count: >=100000

## 2014-04-25 DIAGNOSIS — H3531 Nonexudative age-related macular degeneration: Secondary | ICD-10-CM | POA: Diagnosis not present

## 2014-04-27 ENCOUNTER — Other Ambulatory Visit: Payer: Self-pay | Admitting: *Deleted

## 2014-04-27 MED ORDER — WARFARIN SODIUM 5 MG PO TABS: ORAL_TABLET | ORAL | Status: DC

## 2014-05-11 DIAGNOSIS — N3941 Urge incontinence: Secondary | ICD-10-CM | POA: Diagnosis not present

## 2014-05-11 DIAGNOSIS — N390 Urinary tract infection, site not specified: Secondary | ICD-10-CM | POA: Diagnosis not present

## 2014-06-22 ENCOUNTER — Ambulatory Visit (INDEPENDENT_AMBULATORY_CARE_PROVIDER_SITE_OTHER): Payer: Medicare Other | Admitting: *Deleted

## 2014-06-22 DIAGNOSIS — I480 Paroxysmal atrial fibrillation: Secondary | ICD-10-CM | POA: Diagnosis not present

## 2014-06-22 DIAGNOSIS — Z5181 Encounter for therapeutic drug level monitoring: Secondary | ICD-10-CM

## 2014-06-22 LAB — POCT INR: INR: 2.1

## 2014-07-04 DIAGNOSIS — Z85828 Personal history of other malignant neoplasm of skin: Secondary | ICD-10-CM | POA: Diagnosis not present

## 2014-07-04 DIAGNOSIS — L570 Actinic keratosis: Secondary | ICD-10-CM | POA: Diagnosis not present

## 2014-07-04 DIAGNOSIS — L820 Inflamed seborrheic keratosis: Secondary | ICD-10-CM | POA: Diagnosis not present

## 2014-07-04 DIAGNOSIS — L821 Other seborrheic keratosis: Secondary | ICD-10-CM | POA: Diagnosis not present

## 2014-07-04 DIAGNOSIS — Z08 Encounter for follow-up examination after completed treatment for malignant neoplasm: Secondary | ICD-10-CM | POA: Diagnosis not present

## 2014-07-13 ENCOUNTER — Ambulatory Visit (INDEPENDENT_AMBULATORY_CARE_PROVIDER_SITE_OTHER): Payer: Medicare Other | Admitting: *Deleted

## 2014-07-13 DIAGNOSIS — I480 Paroxysmal atrial fibrillation: Secondary | ICD-10-CM | POA: Diagnosis not present

## 2014-07-13 DIAGNOSIS — Z5181 Encounter for therapeutic drug level monitoring: Secondary | ICD-10-CM

## 2014-07-13 LAB — POCT INR: INR: 3.4

## 2014-07-15 DIAGNOSIS — L600 Ingrowing nail: Secondary | ICD-10-CM | POA: Diagnosis not present

## 2014-07-15 DIAGNOSIS — B351 Tinea unguium: Secondary | ICD-10-CM | POA: Diagnosis not present

## 2014-07-29 ENCOUNTER — Ambulatory Visit (INDEPENDENT_AMBULATORY_CARE_PROVIDER_SITE_OTHER): Payer: Medicare Other

## 2014-07-29 DIAGNOSIS — Z5181 Encounter for therapeutic drug level monitoring: Secondary | ICD-10-CM | POA: Diagnosis not present

## 2014-07-29 DIAGNOSIS — I480 Paroxysmal atrial fibrillation: Secondary | ICD-10-CM | POA: Diagnosis not present

## 2014-07-29 DIAGNOSIS — I4891 Unspecified atrial fibrillation: Secondary | ICD-10-CM | POA: Diagnosis not present

## 2014-07-29 LAB — POCT INR: INR: 3.3

## 2014-08-11 DIAGNOSIS — N3941 Urge incontinence: Secondary | ICD-10-CM | POA: Diagnosis not present

## 2014-08-11 DIAGNOSIS — Z6827 Body mass index (BMI) 27.0-27.9, adult: Secondary | ICD-10-CM | POA: Diagnosis not present

## 2014-08-15 ENCOUNTER — Ambulatory Visit (INDEPENDENT_AMBULATORY_CARE_PROVIDER_SITE_OTHER): Payer: Medicare Other

## 2014-08-15 DIAGNOSIS — Z5181 Encounter for therapeutic drug level monitoring: Secondary | ICD-10-CM | POA: Diagnosis not present

## 2014-08-15 DIAGNOSIS — I480 Paroxysmal atrial fibrillation: Secondary | ICD-10-CM

## 2014-08-15 DIAGNOSIS — I4891 Unspecified atrial fibrillation: Secondary | ICD-10-CM | POA: Diagnosis not present

## 2014-08-15 LAB — POCT INR: INR: 2.7

## 2014-09-05 ENCOUNTER — Ambulatory Visit (INDEPENDENT_AMBULATORY_CARE_PROVIDER_SITE_OTHER): Payer: Medicare Other | Admitting: Pharmacist

## 2014-09-05 DIAGNOSIS — I480 Paroxysmal atrial fibrillation: Secondary | ICD-10-CM | POA: Diagnosis not present

## 2014-09-05 DIAGNOSIS — I4891 Unspecified atrial fibrillation: Secondary | ICD-10-CM

## 2014-09-05 DIAGNOSIS — Z5181 Encounter for therapeutic drug level monitoring: Secondary | ICD-10-CM | POA: Diagnosis not present

## 2014-09-05 LAB — POCT INR: INR: 1.9

## 2014-09-26 ENCOUNTER — Ambulatory Visit (INDEPENDENT_AMBULATORY_CARE_PROVIDER_SITE_OTHER): Payer: Medicare Other | Admitting: *Deleted

## 2014-09-26 DIAGNOSIS — I480 Paroxysmal atrial fibrillation: Secondary | ICD-10-CM

## 2014-09-26 DIAGNOSIS — I4891 Unspecified atrial fibrillation: Secondary | ICD-10-CM | POA: Diagnosis not present

## 2014-09-26 DIAGNOSIS — Z5181 Encounter for therapeutic drug level monitoring: Secondary | ICD-10-CM

## 2014-09-26 LAB — POCT INR: INR: 2.7

## 2014-10-11 ENCOUNTER — Telehealth: Payer: Self-pay | Admitting: Cardiology

## 2014-10-11 NOTE — Telephone Encounter (Signed)
Will forward to the [facility] clinic. 

## 2014-10-11 NOTE — Telephone Encounter (Signed)
New Message  Pt called states that she has been on warfarin for sometime. She recently read an article on coumadin stating that it is linked to dementia. She is not sure that coumadin and warfarin are the same and would like clarification on that as well. Request to call back to discuss

## 2014-10-12 NOTE — Telephone Encounter (Signed)
Pt stated she read article that described warfarin as an anti-cholinergic (pt spelled this), and thus would increase her risk of developing dementia.    Assured her that warfarin is not an anti-cholinergic medication, and that there is no evidence that long term warfarin use leads to dementia.

## 2014-10-26 ENCOUNTER — Ambulatory Visit (INDEPENDENT_AMBULATORY_CARE_PROVIDER_SITE_OTHER): Payer: Medicare Other | Admitting: *Deleted

## 2014-10-26 DIAGNOSIS — Z5181 Encounter for therapeutic drug level monitoring: Secondary | ICD-10-CM

## 2014-10-26 DIAGNOSIS — I480 Paroxysmal atrial fibrillation: Secondary | ICD-10-CM | POA: Diagnosis not present

## 2014-10-26 DIAGNOSIS — I4891 Unspecified atrial fibrillation: Secondary | ICD-10-CM

## 2014-10-26 LAB — POCT INR: INR: 2.3

## 2014-11-02 LAB — HM DIABETES EYE EXAM

## 2014-11-04 DIAGNOSIS — H35363 Drusen (degenerative) of macula, bilateral: Secondary | ICD-10-CM | POA: Diagnosis not present

## 2014-11-04 DIAGNOSIS — H43392 Other vitreous opacities, left eye: Secondary | ICD-10-CM | POA: Diagnosis not present

## 2014-11-04 DIAGNOSIS — H16223 Keratoconjunctivitis sicca, not specified as Sjogren's, bilateral: Secondary | ICD-10-CM | POA: Diagnosis not present

## 2014-11-04 DIAGNOSIS — Z961 Presence of intraocular lens: Secondary | ICD-10-CM | POA: Diagnosis not present

## 2014-11-04 DIAGNOSIS — H5203 Hypermetropia, bilateral: Secondary | ICD-10-CM | POA: Diagnosis not present

## 2014-11-04 DIAGNOSIS — H3531 Nonexudative age-related macular degeneration: Secondary | ICD-10-CM | POA: Diagnosis not present

## 2014-11-30 ENCOUNTER — Ambulatory Visit (INDEPENDENT_AMBULATORY_CARE_PROVIDER_SITE_OTHER): Payer: Medicare Other | Admitting: *Deleted

## 2014-11-30 ENCOUNTER — Other Ambulatory Visit: Payer: Self-pay | Admitting: Cardiology

## 2014-11-30 ENCOUNTER — Other Ambulatory Visit: Payer: Self-pay | Admitting: *Deleted

## 2014-11-30 DIAGNOSIS — Z5181 Encounter for therapeutic drug level monitoring: Secondary | ICD-10-CM | POA: Diagnosis not present

## 2014-11-30 DIAGNOSIS — I480 Paroxysmal atrial fibrillation: Secondary | ICD-10-CM | POA: Diagnosis not present

## 2014-11-30 DIAGNOSIS — I4891 Unspecified atrial fibrillation: Secondary | ICD-10-CM | POA: Diagnosis not present

## 2014-11-30 LAB — POCT INR: INR: 2.8

## 2014-12-13 ENCOUNTER — Ambulatory Visit (INDEPENDENT_AMBULATORY_CARE_PROVIDER_SITE_OTHER): Payer: Medicare Other | Admitting: Physician Assistant

## 2014-12-13 ENCOUNTER — Ambulatory Visit (HOSPITAL_BASED_OUTPATIENT_CLINIC_OR_DEPARTMENT_OTHER)
Admission: RE | Admit: 2014-12-13 | Discharge: 2014-12-13 | Disposition: A | Discharge status: Home / Self Care | Payer: Medicare Other | Source: Ambulatory Visit | Attending: Physician Assistant | Admitting: Physician Assistant

## 2014-12-13 ENCOUNTER — Encounter: Payer: Self-pay | Admitting: Physician Assistant

## 2014-12-13 VITALS — BP 110/58 | HR 63 | Temp 97.8°F | Resp 18 | Ht 66.0 in | Wt 168.0 lb

## 2014-12-13 DIAGNOSIS — M25571 Pain in right ankle and joints of right foot: Secondary | ICD-10-CM | POA: Insufficient documentation

## 2014-12-13 DIAGNOSIS — W010XXA Fall on same level from slipping, tripping and stumbling without subsequent striking against object, initial encounter: Secondary | ICD-10-CM | POA: Insufficient documentation

## 2014-12-13 DIAGNOSIS — M7989 Other specified soft tissue disorders: Secondary | ICD-10-CM | POA: Diagnosis not present

## 2014-12-13 DIAGNOSIS — S99911A Unspecified injury of right ankle, initial encounter: Secondary | ICD-10-CM

## 2014-12-13 DIAGNOSIS — S9001XA Contusion of right ankle, initial encounter: Secondary | ICD-10-CM | POA: Insufficient documentation

## 2014-12-13 NOTE — Patient Instructions (Addendum)
Go downstairs for x-ray. I will call you with your results. Please use Tylenol if needed for pain. Keep ankle iced and elevated. Wear supportive shoes (lace-ups) over the next week.  If anything is broken, we will set you up with Orthopedic Surgery.

## 2014-12-13 NOTE — Progress Notes (Signed)
Pre visit review using our clinic review tool, if applicable. No additional management support is needed unless otherwise documented below in the visit note. 

## 2014-12-13 NOTE — Progress Notes (Signed)
Patient presents to clinic today c/o pain and swelling in R foot x 3 days after slipping in a kiddie pool and falling onto buttocks.  Denies pain of ankle immediately, was able to get up and ambulate. Endorses pain and swelling to R lateral ankle since then.  Denies bruising, numbness, tingling or weakness.  Denies hx of broken bones. Does have osteoporosis currently on Vitamin D supplement.  Past Medical History  Diagnosis Date  . Normal cardiac stress test 01/27/09    lvef 73% done at [facility] (nuclear stress)  . Atrial fibrillation     on Coumadin since mid 2010  . HTN (hypertension)   . Incontinence   . Vitamin D deficiency 12/13/2011  . Squamous cell carcinoma 10/14    left side of nose    Current Outpatient Prescriptions on File Prior to Visit  Medication Sig Dispense Refill  . Ascorbic Acid (VITAMIN C) 1000 MG tablet Take 500 mg by mouth daily.    . cholecalciferol (VITAMIN D) 1000 UNITS tablet Take 3,000 Units by mouth daily.    Marland Kitchen CRANBERRY PO 25,200 mg daily    . Cyanocobalamin (B-12 SL) Place 1 each under the tongue daily.    Marland Kitchen diltiazem (CARDIZEM) 120 MG tablet Take 1 tablet (120 mg total) by mouth daily. 90 tablet 3  . Multiple Vitamins-Minerals (PRESERVISION AREDS 2) CAPS Take 2 capsules by mouth daily.     Marland Kitchen OVER THE COUNTER MEDICATION ARTIC OIL 500 mg 2 tab daily (krill oil).    Marland Kitchen VITAMIN E PO Take 1 capsule by mouth daily.    Marland Kitchen warfarin (COUMADIN) 5 MG tablet Take as directed by Coumadin Clinic 30 tablet 3   No current facility-administered medications on file prior to visit.    No Known Allergies  Family History  Problem Relation Age of Onset  . Hypertension Mother   . COPD Father     smoker  . Cancer Father     lung  . Cancer Sister     breast  . Bell's palsy Sister   . Heart disease Sister     CHF  . Cancer Daughter     breast/ stage 1- s/p lumpectomy and radiation  . Cancer Other     Hodgkins Disease  . Cancer Daughter     History   Social  History  . Marital Status: Widowed    Spouse Name: N/A  . Number of Children: 2  . Years of Education: N/A   Occupational History  .     Social History Main Topics  . Smoking status: Never Smoker   . Smokeless tobacco: Never Used  . Alcohol Use: Yes     Comment: rare- 1 drink/ month  . Drug Use: Not on file  . Sexual Activity: No   Other Topics Concern  . Not on file   Social History Narrative   Regular Exercise- no   Review of Systems - See HPI.  All other ROS are negative.  BP 110/58 mmHg  Pulse 63  Temp(Src) 97.8 F (36.6 C) (Oral)  Resp 18  Ht 5\' 6"  (1.676 m)  Wt 168 lb (76.204 kg)  BMI 27.13 kg/m2  SpO2 97%  Physical Exam  Constitutional: She is oriented to person, place, and time and well-developed, well-nourished, and in no distress.  HENT:  Head: Normocephalic and atraumatic.  Eyes: Conjunctivae are normal.  Musculoskeletal:       Right ankle: She exhibits swelling. She exhibits normal range of motion. Tenderness.  AITFL tenderness found. Achilles tendon normal.  Neurological: She is alert and oriented to person, place, and time.  Skin: Skin is warm and dry. No rash noted.    Recent Results (from the past 2160 hour(s))  POCT INR     Status: None   Collection Time: 09/26/14 11:12 AM  Result Value Ref Range   INR 2.7   POCT INR     Status: None   Collection Time: 10/26/14 12:59 PM  Result Value Ref Range   INR 2.3   POCT INR     Status: None   Collection Time: 11/30/14 11:55 AM  Result Value Ref Range   INR 2.8     Assessment/Plan: Right ankle injury Will obtain x-ray to rule out stress fracture. Grade I sprain present. Bracing applied. Supportive measures discussed with patient. Tylenol for pain given chronic anticoagulation with coumadin.

## 2014-12-13 NOTE — Assessment & Plan Note (Signed)
Will obtain x-ray to rule out stress fracture. Grade I sprain present. Bracing applied. Supportive measures discussed with patient. Tylenol for pain given chronic anticoagulation with coumadin.

## 2015-01-02 ENCOUNTER — Telehealth: Payer: Self-pay

## 2015-01-02 DIAGNOSIS — D1801 Hemangioma of skin and subcutaneous tissue: Secondary | ICD-10-CM | POA: Diagnosis not present

## 2015-01-02 DIAGNOSIS — Z08 Encounter for follow-up examination after completed treatment for malignant neoplasm: Secondary | ICD-10-CM | POA: Diagnosis not present

## 2015-01-02 DIAGNOSIS — Z85828 Personal history of other malignant neoplasm of skin: Secondary | ICD-10-CM | POA: Diagnosis not present

## 2015-01-02 DIAGNOSIS — L570 Actinic keratosis: Secondary | ICD-10-CM | POA: Diagnosis not present

## 2015-01-02 NOTE — Telephone Encounter (Signed)
Called to scheduled Medicare Wellness Visit with Health Coach.  Left a message for call back.

## 2015-01-03 NOTE — Telephone Encounter (Signed)
Pt returned call. Call at [phone].

## 2015-01-09 NOTE — Telephone Encounter (Signed)
Appointment scheduled.

## 2015-01-10 ENCOUNTER — Telehealth: Payer: Self-pay

## 2015-01-10 ENCOUNTER — Ambulatory Visit (INDEPENDENT_AMBULATORY_CARE_PROVIDER_SITE_OTHER): Payer: Medicare Other

## 2015-01-10 VITALS — BP 112/78 | HR 62 | Temp 98.0°F | Ht 66.5 in | Wt 173.6 lb

## 2015-01-10 DIAGNOSIS — Z0000 Encounter for general adult medical examination without abnormal findings: Secondary | ICD-10-CM

## 2015-01-10 NOTE — Telephone Encounter (Signed)
Called [facility] to request a copy of most recent eye exam.  Tech requested consent from patient.  Called patient and left a voice message requesting that patient call eye doctor and provide consent for [place] to receive report for eye exam.

## 2015-01-10 NOTE — Progress Notes (Signed)
Pre visit review using our clinic review tool, if applicable. No additional management support is needed unless otherwise documented below in the visit note. 

## 2015-01-10 NOTE — Patient Instructions (Addendum)
Increase physical activity.  Try Walk Away The Pounds by Raj Janus.  Encourage a friend to join you.       Fall Prevention and Home Safety Falls cause injuries and can affect all age groups. It is possible to prevent falls.  HOW TO PREVENT FALLS  Wear shoes with rubber soles that do not have an opening for your toes.  Keep the inside and outside of your house well lit.  Use night lights throughout your home.  Remove clutter from floors.  Clean up floor spills.  Remove throw rugs or fasten them to the floor with carpet tape.  Do not place electrical cords across pathways.  Put grab bars by your tub, shower, and toilet. Do not use towel bars as grab bars.  Put handrails on both sides of the stairway. Fix loose handrails.  Do not climb on stools or stepladders, if possible.  Do not wax your floors.  Repair uneven or unsafe sidewalks, walkways, or stairs.  Keep items you use a lot within reach.  Be aware of pets.  Keep emergency numbers next to the telephone.  Put smoke detectors in your home and near bedrooms. Ask your doctor what other things you can do to prevent falls. Document Released: 03/23/2009 Document Revised: 11/26/2011 Document Reviewed: 08/27/2011 [facility] Patient Information 2015 [place], [place]. This information is not intended to replace advice given to you by your health care provider. Make sure you discuss any questions you have with your health care provider.  Health Maintenance Adopting a healthy lifestyle and getting preventive care can go a long way to promote health and wellness. Talk with your health care provider about what schedule of regular examinations is right for you. This is a good chance for you to check in with your provider about disease prevention and staying healthy. In between checkups, there are plenty of things you can do on your own. Experts have done a lot of research about which lifestyle changes and preventive measures are most  likely to keep you healthy. Ask your health care provider for more information. WEIGHT AND DIET  Eat a healthy diet  Be sure to include plenty of vegetables, fruits, low-fat dairy products, and lean protein.  Do not eat a lot of foods high in solid fats, added sugars, or salt.  Get regular exercise. This is one of the most important things you can do for your health.  Most adults should exercise for at least 150 minutes each week. The exercise should increase your heart rate and make you sweat (moderate-intensity exercise).  Most adults should also do strengthening exercises at least twice a week. This is in addition to the moderate-intensity exercise.  Maintain a healthy weight  Body mass index (BMI) is a measurement that can be used to identify possible weight problems. It estimates body fat based on height and weight. Your health care provider can help determine your BMI and help you achieve or maintain a healthy weight.  For females 11 years of age and older:   A BMI below 18.5 is considered underweight.  A BMI of 18.5 to 24.9 is normal.  A BMI of 25 to 29.9 is considered overweight.  A BMI of 30 and above is considered obese.  Watch levels of cholesterol and blood lipids  You should start having your blood tested for lipids and cholesterol at 79 years of age, then have this test every 5 years.  You may need to have your cholesterol levels checked more often  if:  Your lipid or cholesterol levels are high.  You are older than 78 years of age.  You are at high risk for heart disease.  CANCER SCREENING   Lung Cancer  Lung cancer screening is recommended for adults 30-20 years old who are at high risk for lung cancer because of a history of smoking.  A yearly low-dose CT scan of the lungs is recommended for people who:  Currently smoke.  Have quit within the past 15 years.  Have at least a 30-pack-year history of smoking. A pack year is smoking an average of one  pack of cigarettes a day for 1 year.  Yearly screening should continue until it has been 15 years since you quit.  Yearly screening should stop if you develop a health problem that would prevent you from having lung cancer treatment.  Breast Cancer  Practice breast self-awareness. This means understanding how your breasts normally appear and feel.  It also means doing regular breast self-exams. Let your health care provider know about any changes, no matter how small.  If you are in your 20s or 30s, you should have a clinical breast exam (CBE) by a health care provider every 1-3 years as part of a regular health exam.  If you are 48 or older, have a CBE every year. Also consider having a breast X-ray (mammogram) every year.  If you have a family history of breast cancer, talk to your health care provider about genetic screening.  If you are at high risk for breast cancer, talk to your health care provider about having an MRI and a mammogram every year.  Breast cancer gene (BRCA) assessment is recommended for women who have family members with BRCA-related cancers. BRCA-related cancers include:  Breast.  Ovarian.  Tubal.  Peritoneal cancers.  Results of the assessment will determine the need for genetic counseling and BRCA1 and BRCA2 testing. Cervical Cancer Routine pelvic examinations to screen for cervical cancer are no longer recommended for nonpregnant women who are considered low risk for cancer of the pelvic organs (ovaries, uterus, and vagina) and who do not have symptoms. A pelvic examination may be necessary if you have symptoms including those associated with pelvic infections. Ask your health care provider if a screening pelvic exam is right for you.   The Pap test is the screening test for cervical cancer for women who are considered at risk.  If you had a hysterectomy for a problem that was not cancer or a condition that could lead to cancer, then you no longer need  Pap tests.  If you are older than 65 years, and you have had normal Pap tests for the past 10 years, you no longer need to have Pap tests.  If you have had past treatment for cervical cancer or a condition that could lead to cancer, you need Pap tests and screening for cancer for at least 20 years after your treatment.  If you no longer get a Pap test, assess your risk factors if they change (such as having a new sexual partner). This can affect whether you should start being screened again.  Some women have medical problems that increase their chance of getting cervical cancer. If this is the case for you, your health care provider may recommend more frequent screening and Pap tests.  The human papillomavirus (HPV) test is another test that may be used for cervical cancer screening. The HPV test looks for the virus that can cause cell changes in  the cervix. The cells collected during the Pap test can be tested for HPV.  The HPV test can be used to screen women 88 years of age and older. Getting tested for HPV can extend the interval between normal Pap tests from three to five years.  An HPV test also should be used to screen women of any age who have unclear Pap test results.  After 79 years of age, women should have HPV testing as often as Pap tests.  Colorectal Cancer  This type of cancer can be detected and often prevented.  Routine colorectal cancer screening usually begins at 79 years of age and continues through 79 years of age.  Your health care provider may recommend screening at an earlier age if you have risk factors for colon cancer.  Your health care provider may also recommend using home test kits to check for hidden blood in the stool.  A small camera at the end of a tube can be used to examine your colon directly (sigmoidoscopy or colonoscopy). This is done to check for the earliest forms of colorectal cancer.  Routine screening usually begins at age 21.  Direct  examination of the colon should be repeated every 5-10 years through 79 years of age. However, you may need to be screened more often if early forms of precancerous polyps or small growths are found. Skin Cancer  Check your skin from head to toe regularly.  Tell your health care provider about any new moles or changes in moles, especially if there is a change in a mole's shape or color.  Also tell your health care provider if you have a mole that is larger than the size of a pencil eraser.  Always use sunscreen. Apply sunscreen liberally and repeatedly throughout the day.  Protect yourself by wearing long sleeves, pants, a wide-brimmed hat, and sunglasses whenever you are outside. HEART DISEASE, DIABETES, AND HIGH BLOOD PRESSURE   Have your blood pressure checked at least every 1-2 years. High blood pressure causes heart disease and increases the risk of stroke.  If you are between 14 years and 72 years old, ask your health care provider if you should take aspirin to prevent strokes.  Have regular diabetes screenings. This involves taking a blood sample to check your fasting blood sugar level.  If you are at a normal weight and have a low risk for diabetes, have this test once every three years after 79 years of age.  If you are overweight and have a high risk for diabetes, consider being tested at a younger age or more often. PREVENTING INFECTION  Hepatitis B  If you have a higher risk for hepatitis B, you should be screened for this virus. You are considered at high risk for hepatitis B if:  You were born in a country where hepatitis B is common. Ask your health care provider which countries are considered high risk.  Your parents were born in a high-risk country, and you have not been immunized against hepatitis B (hepatitis B vaccine).  You have HIV or AIDS.  You use needles to inject street drugs.  You live with someone who has hepatitis B.  You have had sex with someone  who has hepatitis B.  You get hemodialysis treatment.  You take certain medicines for conditions, including cancer, organ transplantation, and autoimmune conditions. Hepatitis C  Blood testing is recommended for:  Everyone born from [birth year] through 1963.  Anyone with known risk factors for hepatitis C. Sexually  transmitted infections (STIs)  You should be screened for sexually transmitted infections (STIs) including gonorrhea and chlamydia if:  You are sexually active and are younger than 79 years of age.  You are older than 79 years of age and your health care provider tells you that you are at risk for this type of infection.  Your sexual activity has changed since you were last screened and you are at an increased risk for chlamydia or gonorrhea. Ask your health care provider if you are at risk.  If you do not have HIV, but are at risk, it may be recommended that you take a prescription medicine daily to prevent HIV infection. This is called pre-exposure prophylaxis (PrEP). You are considered at risk if:  You are sexually active and do not regularly use condoms or know the HIV status of your partner(s).  You take drugs by injection.  You are sexually active with a partner who has HIV. Talk with your health care provider about whether you are at high risk of being infected with HIV. If you choose to begin PrEP, you should first be tested for HIV. You should then be tested every 3 months for as long as you are taking PrEP.  PREGNANCY   If you are premenopausal and you may become pregnant, ask your health care provider about preconception counseling.  If you may become pregnant, take 400 to 800 micrograms (mcg) of folic acid every day.  If you want to prevent pregnancy, talk to your health care provider about birth control (contraception). OSTEOPOROSIS AND MENOPAUSE   Osteoporosis is a disease in which the bones lose minerals and strength with aging. This can result in serious  bone fractures. Your risk for osteoporosis can be identified using a bone density scan.  If you are 69 years of age or older, or if you are at risk for osteoporosis and fractures, ask your health care provider if you should be screened.  Ask your health care provider whether you should take a calcium or vitamin D supplement to lower your risk for osteoporosis.  Menopause may have certain physical symptoms and risks.  Hormone replacement therapy may reduce some of these symptoms and risks. Talk to your health care provider about whether hormone replacement therapy is right for you.  HOME CARE INSTRUCTIONS   Schedule regular health, dental, and eye exams.  Stay current with your immunizations.   Do not use any tobacco products including cigarettes, chewing tobacco, or electronic cigarettes.  If you are pregnant, do not drink alcohol.  If you are breastfeeding, limit how much and how often you drink alcohol.  Limit alcohol intake to no more than 1 drink per day for nonpregnant women. One drink equals 12 ounces of beer, 5 ounces of wine, or 1 ounces of hard liquor.  Do not use street drugs.  Do not share needles.  Ask your health care provider for help if you need support or information about quitting drugs.  Tell your health care provider if you often feel depressed.  Tell your health care provider if you have ever been abused or do not feel safe at home. Document Released: 12/10/2010 Document Revised: 10/11/2013 Document Reviewed: 04/28/2013 [facility] Patient Information 2015 [place], [place]. This information is not intended to replace advice given to you by your health care provider. Make sure you discuss any questions you have with your health care provider.

## 2015-01-10 NOTE — Progress Notes (Addendum)
Subjective:   Carla Curtis is a 81 y.o. female who presents for Medicare Annual (Subsequent) preventive examination.  Review of Systems:  Risk Factors:  Essential HTN- on diltiazem.  Encouraged heart health diet and increase physical activity.   Atrial Fibrillation- on coumadin and diltiazem, no complaints of chest pain, shortness of breath, and weakness. Osteopenia- Takes calcium and vitamin d.  Encouraged to increase weight bearing exercise.     Diet (meal preparation, eat out, water intake, caffeinated beverages, dairy products, fruits and vegetables):   Eats lean protein, veggies and fruit, caffeinated beverages (2 cups of coffee in am/glass of tea for lunch or dinner), portions control.    Physical Activity/Exercise:  Some housekeeping, gardening, and walking some in neighborhood. Not consistent.  Pt would like to increase physical activity.    Sleep patterns:   Sleep 5-6 hours   Vision:  No major changes.   Hearing:  Able to hear conversational tones and whispered sentences from 5 ft away.   Fall Risk:  LOW risk   Home Safety/Smoke Alarms:  Feels safe at home.  Lives with grandchild.  Smoke detector present.   Firearm Safety: 1 firearm, safely locked away in attic.   Seat Belt Safety/Bike Helmet:  Always wears seat belts.   Counseling:   Eye Exam- pt reported-11/02/14 at [facility] and lower dentures. Has not been to dentist in years.   Female:  Pap-hysterectomy     Mammo- 08/04/13    Dexa scan- 12/18/11      [facility]-  [facility]- UTD      Objective:     Vitals: BP 112/78 mmHg  Pulse 62  Temp(Src) 98 F (36.7 C) (Oral)  Ht 5' 6.5" (1.689 m)  Wt 173 lb 9.6 oz (78.744 kg)  BMI 27.60 kg/m2  SpO2 96%  Tobacco History  Smoking status  . Never Smoker   Smokeless tobacco  . Never Used    Comment: Lived with smokers      Counseling given: Not Answered   Past Medical History  Diagnosis Date  . Normal cardiac stress test 01/27/09    lvef 73%  done at [facility] (nuclear stress)  . Atrial fibrillation     on Coumadin since mid 2010  . HTN (hypertension)   . Incontinence   . Vitamin D deficiency 12/13/2011  . Squamous cell carcinoma 10/14    left side of nose  . Macular degeneration    Past Surgical History  Procedure Laterality Date  . Laminectomy  1989 and 1990    L4, L5  . Bilateral mammoplasty    . Tonsillectomy and adenoidectomy  1941  . Abdominal hysterectomy  2007  . Arm skin lesion biopsy / excision      pre-cancerous   Family History  Problem Relation Age of Onset  . Hypertension Mother   . COPD Father     smoker  . Cancer Father     lung  . Cancer Sister     breast  . Bell's palsy Sister   . Heart disease Sister     CHF  . Cancer Daughter     breast/ stage 1- s/p lumpectomy and radiation  . Cancer Other     Hodgkins Disease  . Cancer Daughter    History  Sexual Activity  . Sexual Activity: No    Outpatient Encounter Prescriptions as of 01/10/2015  Medication Sig  . Ascorbic Acid (VITAMIN C) 1000 MG tablet Take 500 mg by mouth daily.  Marland Kitchen  cholecalciferol (VITAMIN D) 1000 UNITS tablet Take 3,000 Units by mouth daily.  Marland Kitchen CRANBERRY PO 25,200 mg daily  . Cyanocobalamin (B-12 SL) Place 1 each under the tongue daily.  Marland Kitchen diltiazem (CARDIZEM) 120 MG tablet Take 1 tablet (120 mg total) by mouth daily.  . Multiple Vitamins-Minerals (MULTIVITAMIN ADULT PO) Take 1 tablet by mouth daily.  . Multiple Vitamins-Minerals (PRESERVISION AREDS 2) CAPS Take 2 capsules by mouth daily.   Marland Kitchen OVER THE COUNTER MEDICATION ARTIC OIL 500 mg 2 tab daily (krill oil).  Marland Kitchen VITAMIN E PO Take 1 capsule by mouth daily.  Marland Kitchen warfarin (COUMADIN) 5 MG tablet Take as directed by [facility]   No facility-administered encounter medications on file as of 01/10/2015.    Activities of Daily Living In your present state of health, do you have any difficulty performing the following activities: 01/10/2015  Hearing? N  Vision? N  Difficulty  concentrating or making decisions? Y  Walking or climbing stairs? N  Dressing or bathing? N  Doing errands, shopping? N  Preparing Food and eating ? N  Using the Toilet? N  In the past six months, have you accidently leaked urine? Y  Do you have problems with loss of bowel control? N  Managing your Medications? N  Managing your Finances? N  Housekeeping or managing your Housekeeping? N    Patient Care Team: Debbrah Alar, NP as PCP - General Christy Sartorius, MD as Consulting Physician (Urology) Linward Natal, MD as Referring Physician (Ophthalmology)    Assessment:    Exercise Activities and Dietary recommendations Current Exercise Habits:: The patient does not participate in regular exercise at present  Goals    . Increase physical activity     Achieve goal by walking in neighborhood or try Walk Away The Pound by Raj Janus       Fall Risk Fall Risk  01/10/2015 12/13/2014 07/20/2013  Falls in the past year? Yes No No  Number falls in past yr: 1 - -  Injury with Fall? No - -  Follow up Education provided - -   Depression Screen PHQ 2/9 Scores 01/10/2015 12/13/2014 07/20/2013  PHQ - 2 Score 0 0 0     Cognitive Testing MMSE - Mini Mental State Exam 01/10/2015  Orientation to time 5  Orientation to Place 5  Registration 3  Attention/ Calculation 5  Recall 3  Language- name 2 objects 2  Language- repeat 1  Language- follow 3 step command 3  Language- read & follow direction 1  Write a sentence 1  Copy design 1  Total score 30    Immunization History  Administered Date(s) Administered  . Hep A / Hep B 11/19/2013, 11/26/2013, 12/08/2013  . Influenza Split 05/10/2012  . Influenza, High Dose Seasonal PF 06/23/2013  . Influenza,inj,Quad PF,36+ Mos 01/31/2014  . Pneumococcal Conjugate-13 07/20/2013  . Pneumococcal Polysaccharide-23 06/12/2009  . Td 02/06/2007  . Zoster 06/19/2009   Screening Tests Health Maintenance  Topic Date Due  . INFLUENZA VACCINE   01/09/2015  . HEMOGLOBIN A1C  05/10/2015 (Originally 01/17/2014)  . URINE MICROALBUMIN  05/10/2015 (Originally 08/02/1942)  . OPHTHALMOLOGY EXAM  11/02/2015  . FOOT EXAM  01/10/2016  . TETANUS/TDAP  02/05/2017  . COLONOSCOPY  12/03/2021  . DEXA SCAN  Completed  . ZOSTAVAX  Completed  . PNA vac Low Risk Adult  Completed      Plan:  Increase physical activity.  Try Walk Away The Pounds by Raj Janus.  Encourage a friend  to join you.        During the course of the visit the patient was educated and counseled about the following appropriate screening and preventive services:   Vaccines to include Pneumoccal, Influenza, Hepatitis B, Td, Zostavax, HCV  Electrocardiogram  Cardiovascular Disease  Colorectal cancer screening  Bone density screening  Diabetes screening  Glaucoma screening  Mammography/PAP  Nutrition counseling   Patient Instructions (the written plan) was given to the patient.   Rudene Anda, RN  01/18/2015

## 2015-01-24 NOTE — Telephone Encounter (Signed)
Left a message for call back.  

## 2015-01-24 NOTE — Telephone Encounter (Signed)
Pt is returning your call please call back

## 2015-01-27 NOTE — Telephone Encounter (Signed)
Noted. Thanks.

## 2015-01-27 NOTE — Telephone Encounter (Signed)
Pt called back. [facility] won't release with verbal consent. She is not feeling well and cannot go sign form. They are mailing to her and she will return to them after that. She apologized for delay.

## 2015-01-29 DIAGNOSIS — J209 Acute bronchitis, unspecified: Secondary | ICD-10-CM | POA: Diagnosis not present

## 2015-01-30 ENCOUNTER — Ambulatory Visit (INDEPENDENT_AMBULATORY_CARE_PROVIDER_SITE_OTHER): Payer: Medicare Other | Admitting: Medical

## 2015-01-30 ENCOUNTER — Encounter: Payer: Self-pay | Admitting: Medical

## 2015-01-30 VITALS — BP 111/58 | HR 65 | Temp 97.7°F | Ht 66.0 in | Wt 172.6 lb

## 2015-01-30 DIAGNOSIS — Z8679 Personal history of other diseases of the circulatory system: Secondary | ICD-10-CM

## 2015-01-30 DIAGNOSIS — R05 Cough: Secondary | ICD-10-CM

## 2015-01-30 DIAGNOSIS — R5383 Other fatigue: Secondary | ICD-10-CM

## 2015-01-30 DIAGNOSIS — R0600 Dyspnea, unspecified: Secondary | ICD-10-CM

## 2015-01-30 DIAGNOSIS — R059 Cough, unspecified: Secondary | ICD-10-CM

## 2015-01-30 DIAGNOSIS — I4891 Unspecified atrial fibrillation: Secondary | ICD-10-CM | POA: Diagnosis not present

## 2015-01-30 DIAGNOSIS — R062 Wheezing: Secondary | ICD-10-CM | POA: Diagnosis not present

## 2015-01-30 LAB — CBC WITH DIFFERENTIAL/PLATELET
Basophils Absolute: 0 10*3/uL (ref 0.0–0.1)
Basophils Relative: 0.4 % (ref 0.0–3.0)
EOS ABS: 0.2 10*3/uL (ref 0.0–0.7)
Eosinophils Relative: 2.5 % (ref 0.0–5.0)
HCT: 39.5 % (ref 36.0–46.0)
HEMOGLOBIN: 13.1 g/dL (ref 12.0–15.0)
LYMPHS PCT: 36.1 % (ref 12.0–46.0)
Lymphs Abs: 2.3 10*3/uL (ref 0.7–4.0)
MCHC: 33.2 g/dL (ref 30.0–36.0)
MCV: 91.8 fl (ref 78.0–100.0)
MONO ABS: 0.5 10*3/uL (ref 0.1–1.0)
Monocytes Relative: 8.7 % (ref 3.0–12.0)
Neutro Abs: 3.3 10*3/uL (ref 1.4–7.7)
Neutrophils Relative %: 52.3 % (ref 43.0–77.0)
Platelets: 214 10*3/uL (ref 150.0–400.0)
RBC: 4.3 Mil/uL (ref 3.87–5.11)
RDW: 14.9 % (ref 11.5–15.5)
WBC: 6.3 10*3/uL (ref 4.0–10.5)

## 2015-01-30 LAB — COMPREHENSIVE METABOLIC PANEL
ALT: 7 U/L (ref 0–35)
AST: 19 U/L (ref 0–37)
Albumin: 3.9 g/dL (ref 3.5–5.2)
Alkaline Phosphatase: 74 U/L (ref 39–117)
BUN: 12 mg/dL (ref 6–23)
CHLORIDE: 105 meq/L (ref 96–112)
CO2: 31 mEq/L (ref 19–32)
CREATININE: 0.64 mg/dL (ref 0.40–1.20)
Calcium: 9.9 mg/dL (ref 8.4–10.5)
GFR: 94.78 mL/min (ref >60.00)
Glucose, Bld: 93 mg/dL (ref 70–99)
Potassium: 4.1 mEq/L (ref 3.5–5.1)
SODIUM: 142 meq/L (ref 135–145)
TOTAL PROTEIN: 6.9 g/dL (ref 6.0–8.3)
Total Bilirubin: 0.6 mg/dL (ref 0.2–1.2)

## 2015-01-30 LAB — POCT INR: INR: 1.6

## 2015-01-30 LAB — BRAIN NATRIURETIC PEPTIDE: Pro B Natriuretic peptide (BNP): 300 pg/mL — ABNORMAL HIGH (ref 0.0–100.0)

## 2015-01-30 MED ORDER — ALBUTEROL SULFATE HFA 108 (90 BASE) MCG/ACT IN AERS: 2.0000 | INHALATION_SPRAY | Freq: Four times a day (QID) | RESPIRATORY_TRACT | Status: DC | PRN

## 2015-01-30 MED ORDER — IPRATROPIUM BROMIDE HFA 17 MCG/ACT IN AERS: 2.0000 | INHALATION_SPRAY | Freq: Four times a day (QID) | RESPIRATORY_TRACT | Status: DC | PRN

## 2015-01-30 MED ORDER — PREDNISONE 10 MG PO TABS: 10.0000 mg | ORAL_TABLET | Freq: Every day | ORAL | Status: DC

## 2015-01-30 NOTE — Progress Notes (Signed)
Subjective:    Patient ID: Carla Curtis, female    DOB: 1932/08/28, 81 y.o.   MRN: [id number]  HPI  Pt in for follow up from UC. Pt was given neb tx yesterday. Pt had xray done showed no infection. Describes coughing up some mucous today. Not a lot.   Pt had decreased energy.  She was had st early in week. Then got chest congestion and cough. By yesterday was short of breath moderate to severe. After neb treatment she felt better but not completley.  No history of smoking. But a lot of second hand smoke in the past.2 husbands that passed away were 2 pack a day smokers.   Pt is not diabetic.   6 weeks since last inr. Pt goes to coumadin clinic. She needs to call and reschedule.  Pt does have neb machine available to her if she needs.    Review of Systems  Constitutional: Positive for fever, chills and fatigue.  Respiratory: Positive for cough and wheezing. Negative for chest tightness and shortness of breath.   Cardiovascular: Negative for chest pain and palpitations.  Musculoskeletal: Positive for back pain.       Mild left side back. Pain lower thoracic area after illness/cough onset. Started about same time.  Skin: Negative for rash.  Neurological: Negative for dizziness and headaches.  Hematological: Negative for adenopathy. Does not bruise/bleed easily.  Psychiatric/Behavioral: Negative for behavioral problems and confusion.   Past Medical History  Diagnosis Date  . Normal cardiac stress test 01/27/09    lvef 73% done at [facility] (nuclear stress)  . Atrial fibrillation     on Coumadin since mid 2010  . HTN (hypertension)   . Incontinence   . Vitamin D deficiency 12/13/2011  . Squamous cell carcinoma 10/14    left side of nose  . Macular degeneration     Social History   Social History  . Marital Status: Widowed    Spouse Name: N/A  . Number of Children: 2  . Years of Education: N/A   Occupational History  .     Social History Main Topics  . Smoking status:  Never Smoker   . Smokeless tobacco: Never Used     Comment: Lived with smokers   . Alcohol Use: 0.0 oz/week    0 Standard drinks or equivalent per week     Comment: rare- 1 drink/ month  . Drug Use: No  . Sexual Activity: No   Other Topics Concern  . Not on file   Social History Narrative   Regular Exercise- no    Past Surgical History  Procedure Laterality Date  . Laminectomy  1989 and 1990    L4, L5  . Bilateral mammoplasty    . Tonsillectomy and adenoidectomy  1941  . Abdominal hysterectomy  2007  . Arm skin lesion biopsy / excision      pre-cancerous    Family History  Problem Relation Age of Onset  . Hypertension Mother   . COPD Father     smoker  . Cancer Father     lung  . Cancer Sister     breast  . Bell's palsy Sister   . Heart disease Sister     CHF  . Cancer Daughter     breast/ stage 1- s/p lumpectomy and radiation  . Cancer Other     Hodgkins Disease  . Cancer Daughter     No Known Allergies  Current Outpatient Prescriptions on File Prior to  Visit  Medication Sig Dispense Refill  . diltiazem (CARDIZEM) 120 MG tablet Take 1 tablet (120 mg total) by mouth daily. 90 tablet 3  . Multiple Vitamins-Minerals (MULTIVITAMIN ADULT PO) Take 1 tablet by mouth daily.    . Multiple Vitamins-Minerals (PRESERVISION AREDS 2) CAPS Take 2 capsules by mouth daily.     Marland Kitchen OVER THE COUNTER MEDICATION ARTIC OIL 500 mg 2 tab daily (krill oil).    Marland Kitchen VITAMIN E PO Take 1 capsule by mouth daily.    Marland Kitchen warfarin (COUMADIN) 5 MG tablet Take as directed by [facility] Clinic 30 tablet 3  . Ascorbic Acid (VITAMIN C) 1000 MG tablet Take 500 mg by mouth daily.    . cholecalciferol (VITAMIN D) 1000 UNITS tablet Take 3,000 Units by mouth daily.    Marland Kitchen CRANBERRY PO 25,200 mg daily    . Cyanocobalamin (B-12 SL) Place 1 each under the tongue daily.     No current facility-administered medications on file prior to visit.    BP 111/58 mmHg  Pulse 65  Temp(Src) 97.7 F (36.5 C) (Oral)   Ht 5\' 6"  (1.676 m)  Wt 172 lb 9.6 oz (78.291 kg)  BMI 27.87 kg/m2  SpO2 95%       Objective:   Physical Exam   General  Mental Status - Alert. General Appearance - Well groomed. Not in acute distress.  Skin Rashes- No Rashes.  HEENT Head- Normal. Ear Auditory Canal - Left- Normal. Right - Normal.Tympanic Membrane- Left- Normal. Right- Normal. Eye Sclera/Conjunctiva- Left- Normal. Right- Normal. Nose & Sinuses Nasal Mucosa- Left-  Not boggy or Congested. Right-  Not  boggy or Congested. Mouth & Throat Lips: Upper Lip- Normal: no dryness, cracking, pallor, cyanosis, or vesicular eruption. Lower Lip-Normal: no dryness, cracking, pallor, cyanosis or vesicular eruption. Buccal Mucosa- Bilateral- No Aphthous ulcers. Oropharynx- No Discharge or Erythema. Tonsils: Characteristics- Bilateral- No Erythema or Congestion. Size/Enlargement- Bilateral- No enlargement. Discharge- bilateral-None.  Neck Neck- Supple. No Masses.   Chest and Lung Exam Auscultation: Breath Sounds:- even, moderate shallow breathing, unlabored respirations.(lying supine no sob 02 sat% stable at 96)  Cardiovascular Auscultation:Rythm- Regular, rate and rhythm. Murmurs & Other Heart Sounds:Ausculatation of the heart reveal- No Murmurs.  Lymphatic Head & Neck General Head & Neck Lymphatics: Bilateral: Description- No Localized lymphadenopathy.  Lower ext- no pedal edema. Negative homans signs.       Assessment & Plan:  Some reactive airway disease by your description recently. The chest xray was negative for infection.   You are better today compared to yesterday but still with some symtpoms. I want to rx atrovent inhaler, low dose prednisone for 3 days, and albuterol inhaler to use if needed.  For your fatigue get cbc, cmp, and bnp.  Your inr is low today. I want you to call coumadin clinic and let them know that inr is 1.6. They can adjust accordingly.  Please call us on Wednesday. Update on how  you feel. If clinically having  more infectious type symptoms then may rx antibiotic or repeat cxr. If antibiotic needed type written may likely effect INR.  Follow up this Friday or as needed.

## 2015-01-30 NOTE — Addendum Note (Signed)
Addended by: Bunnie Domino on: 01/30/2015 02:01 PM   Modules accepted: Orders

## 2015-01-30 NOTE — Patient Instructions (Addendum)
Some reactive airway disease by your description recently. The chest xray was negative for infection.   You are better today compared to yesterday but still with some symtpoms. I want to rx atrovent inhaler, low dose prednisone for 3 days, and albuterol inhaler to use if needed.  For your fatigue get cbc, cmp, and bnp.  Your inr is low today. I want you to call coumadin clinic and let them know that inr is 1.6. They can adjust accordingly.  Please call us on Wednesday. Update on how you feel. If clinically having more infectious type symptoms then may rx antibiotic or repeat cxr. If antibiotic needed type written may likely effect INR.  Follow up this Friday or as needed.

## 2015-01-30 NOTE — Progress Notes (Signed)
Pre visit review using our clinic review tool, if applicable. No additional management support is needed unless otherwise documented below in the visit note. 

## 2015-01-31 ENCOUNTER — Telehealth: Payer: Self-pay | Admitting: Cardiology

## 2015-01-31 ENCOUNTER — Ambulatory Visit (INDEPENDENT_AMBULATORY_CARE_PROVIDER_SITE_OTHER): Payer: Medicare Other | Admitting: Cardiology

## 2015-01-31 DIAGNOSIS — I4891 Unspecified atrial fibrillation: Secondary | ICD-10-CM

## 2015-01-31 DIAGNOSIS — Z5181 Encounter for therapeutic drug level monitoring: Secondary | ICD-10-CM

## 2015-01-31 NOTE — Telephone Encounter (Signed)
New message     Pt states her PCP checked her PT/INR yesterday.  He is in epic----please call her and let her know how to take her coumadin

## 2015-01-31 NOTE — Telephone Encounter (Signed)
I called pt with her cbc, cmp and bnp results. She is doing well with her breathing. No sob. States slept well. She is waiting for coumadin clinic to call back on adjusting her coumadin. She will update [place] tomorrow how she feels. She is going to get atrovent today. Pharmacy was out of this yesterday.

## 2015-01-31 NOTE — Telephone Encounter (Signed)
Result addressed, see anticoagulation note in Epic.  

## 2015-02-02 ENCOUNTER — Ambulatory Visit (HOSPITAL_BASED_OUTPATIENT_CLINIC_OR_DEPARTMENT_OTHER)
Admission: RE | Admit: 2015-02-02 | Discharge: 2015-02-02 | Disposition: A | Discharge status: Home / Self Care | Payer: Medicare Other | Source: Ambulatory Visit | Attending: Medical | Admitting: Medical

## 2015-02-02 ENCOUNTER — Telehealth: Payer: Self-pay | Admitting: Family

## 2015-02-02 ENCOUNTER — Encounter: Payer: Self-pay | Admitting: Medical

## 2015-02-02 ENCOUNTER — Ambulatory Visit (INDEPENDENT_AMBULATORY_CARE_PROVIDER_SITE_OTHER): Payer: Medicare Other | Admitting: Medical

## 2015-02-02 VITALS — BP 135/65 | HR 75 | Temp 97.9°F | Ht 66.0 in | Wt 171.8 lb

## 2015-02-02 DIAGNOSIS — R059 Cough, unspecified: Secondary | ICD-10-CM

## 2015-02-02 DIAGNOSIS — J0100 Acute maxillary sinusitis, unspecified: Secondary | ICD-10-CM | POA: Diagnosis not present

## 2015-02-02 DIAGNOSIS — R0989 Other specified symptoms and signs involving the circulatory and respiratory systems: Secondary | ICD-10-CM | POA: Diagnosis not present

## 2015-02-02 DIAGNOSIS — R05 Cough: Secondary | ICD-10-CM | POA: Insufficient documentation

## 2015-02-02 DIAGNOSIS — J40 Bronchitis, not specified as acute or chronic: Secondary | ICD-10-CM | POA: Insufficient documentation

## 2015-02-02 DIAGNOSIS — J208 Acute bronchitis due to other specified organisms: Secondary | ICD-10-CM | POA: Diagnosis not present

## 2015-02-02 DIAGNOSIS — I4891 Unspecified atrial fibrillation: Secondary | ICD-10-CM | POA: Diagnosis not present

## 2015-02-02 MED ORDER — BENZONATATE 100 MG PO CAPS: 100.0000 mg | ORAL_CAPSULE | Freq: Three times a day (TID) | ORAL | Status: DC | PRN

## 2015-02-02 MED ORDER — FLUTICASONE PROPIONATE 50 MCG/ACT NA SUSP: 2.0000 | Freq: Every day | NASAL | Status: DC

## 2015-02-02 MED ORDER — CEPHALEXIN 500 MG PO CAPS: 500.0000 mg | ORAL_CAPSULE | Freq: Three times a day (TID) | ORAL | Status: DC

## 2015-02-02 NOTE — Patient Instructions (Addendum)
Your cough persists and some concern that may have bronchitis. I will rx cephalexin antibiotic. This may treat sinusitis as well.(If occuring early since you sound very nasal congested)  For nasal congestion rx flonase. For cough rx benzonatate.  I though you had some component of reactive airways on last visit. This still may be the case. Continue atrovent. Use albuterol if needed.  Please get cxr today.  Follow up 7 days or as needed. You could call us Monday give update. Any severe changing/worsening symptoms over weekend then ED evaluation.

## 2015-02-02 NOTE — Telephone Encounter (Signed)
°  Relation to SR:PRXY Call back number:[phone] Pharmacy:[facility]  Reason for call: pt saw Percell Miller on Monday states he had informed her to call back if she was not improving, pt states she is taking all the meds as directed however she still has a bad cough, feeling lightheaded, no energy at all, still chest congestion. Wants to know if he can call something in or does he want to see her again.

## 2015-02-02 NOTE — Progress Notes (Signed)
Pre visit review using our clinic review tool, if applicable. No additional management support is needed unless otherwise documented below in the visit note. 

## 2015-02-02 NOTE — Progress Notes (Signed)
Subjective:    Patient ID: Carla Curtis, female    DOB: 06-07-1933, 81 y.o.   MRN: [id number]  HPI  Pt in stating she is not much better. But not worse. About the same.  Pt is blowing out some mucous now. Pt is blowing out some mucous from her nose. Pt sounds more nasal congested.  Pt has not been wheezing. No sob. Pt coughs when she talks or laughs.  Pt did not have cxr done last time but had one done at [facility].  Pt cbc was normal, inr was 1.6.(Pt had adjustment on her coumadin per coumadin clinic). She has appointment with them in about a week. Cmp looked good on last visit. bnp mild elevated.     Review of Systems  Constitutional: Negative for fever, chills and fatigue.  HENT: Positive for congestion and postnasal drip. Negative for drooling, ear pain, hearing loss, rhinorrhea and sinus pressure.        Moderate to severe nasal congestion.  Respiratory: Positive for cough. Negative for chest tightness, shortness of breath and wheezing.   Cardiovascular: Negative for chest pain and palpitations.  Musculoskeletal: Negative for back pain.  Skin: Negative for rash.  Hematological: Negative for adenopathy. Does not bruise/bleed easily.   Past Medical History  Diagnosis Date  . Normal cardiac stress test 01/27/09    lvef 73% done at [facility] (nuclear stress)  . Atrial fibrillation     on Coumadin since mid 2010  . HTN (hypertension)   . Incontinence   . Vitamin D deficiency 12/13/2011  . Squamous cell carcinoma 10/14    left side of nose  . Macular degeneration     Social History   Social History  . Marital Status: Widowed    Spouse Name: N/A  . Number of Children: 2  . Years of Education: N/A   Occupational History  .     Social History Main Topics  . Smoking status: Never Smoker   . Smokeless tobacco: Never Used     Comment: Lived with smokers   . Alcohol Use: 0.0 oz/week    0 Standard drinks or equivalent per week     Comment: rare- 1 drink/ month  . Drug Use: No   . Sexual Activity: No   Other Topics Concern  . Not on file   Social History Narrative   Regular Exercise- no    Past Surgical History  Procedure Laterality Date  . Laminectomy  1989 and 1990    L4, L5  . Bilateral mammoplasty    . Tonsillectomy and adenoidectomy  1941  . Abdominal hysterectomy  2007  . Arm skin lesion biopsy / excision      pre-cancerous    Family History  Problem Relation Age of Onset  . Hypertension Mother   . COPD Father     smoker  . Cancer Father     lung  . Cancer Sister     breast  . Bell's palsy Sister   . Heart disease Sister     CHF  . Cancer Daughter     breast/ stage 1- s/p lumpectomy and radiation  . Cancer Other     Hodgkins Disease  . Cancer Daughter     No Known Allergies  Current Outpatient Prescriptions on File Prior to Visit  Medication Sig Dispense Refill  . albuterol (PROVENTIL HFA;VENTOLIN HFA) 108 (90 BASE) MCG/ACT inhaler Inhale 2 puffs into the lungs every 6 (six) hours as needed for wheezing or shortness  of breath. 1 Inhaler 0  . Ascorbic Acid (VITAMIN C) 1000 MG tablet Take 500 mg by mouth daily.    . cholecalciferol (VITAMIN D) 1000 UNITS tablet Take 3,000 Units by mouth daily.    Marland Kitchen CRANBERRY PO 25,200 mg daily    . Cyanocobalamin (B-12 SL) Place 1 each under the tongue daily.    Marland Kitchen diltiazem (CARDIZEM) 120 MG tablet Take 1 tablet (120 mg total) by mouth daily. 90 tablet 3  . ipratropium (ATROVENT HFA) 17 MCG/ACT inhaler Inhale 2 puffs into the lungs every 6 (six) hours as needed for wheezing. 1 Inhaler 2  . Multiple Vitamins-Minerals (MULTIVITAMIN ADULT PO) Take 1 tablet by mouth daily.    . Multiple Vitamins-Minerals (PRESERVISION AREDS 2) CAPS Take 2 capsules by mouth daily.     Marland Kitchen OVER THE COUNTER MEDICATION ARTIC OIL 500 mg 2 tab daily (krill oil).    . predniSONE (DELTASONE) 10 MG tablet Take 1 tablet (10 mg total) by mouth daily with breakfast. 3 tablet 0  . VITAMIN E PO Take 1 capsule by mouth daily.    Marland Kitchen  warfarin (COUMADIN) 5 MG tablet Take as directed by [facility] Clinic 30 tablet 3   No current facility-administered medications on file prior to visit.    BP 135/65 mmHg  Pulse 75  Temp(Src) 97.9 F (36.6 C) (Oral)  Ht 5\' 6"  (1.676 m)  Wt 171 lb 12.8 oz (77.928 kg)  BMI 27.74 kg/m2  SpO2 97%       Objective:   Physical Exam  General  Mental Status - Alert. General Appearance - Well groomed. Not in acute distress.  Skin Rashes- No Rashes.  HEENT Head- Normal. Ear Auditory Canal - Left- Normal. Right - Normal.Tympanic Membrane- Left- Normal. Right- Normal. Eye Sclera/Conjunctiva- Left- Normal. Right- Normal. Nose & Sinuses Nasal Mucosa- Left-  Mild boggy + Congested. Right-  Mild  boggy + Congested. No sinus pressure Mouth & Throat Lips: Upper Lip- Normal: no dryness, cracking, pallor, cyanosis, or vesicular eruption. Lower Lip-Normal: no dryness, cracking, pallor, cyanosis or vesicular eruption. Buccal Mucosa- Bilateral- No Aphthous ulcers. Oropharynx- No Discharge or Erythema. Tonsils: Characteristics- Bilateral- No Erythema or Congestion. Size/Enlargement- Bilateral- No enlargement. Discharge- bilateral-None.  Neck Neck- Supple. No Masses.   Chest and Lung Exam Auscultation: Breath Sounds:- even and unlabored, but faint  bilateral upper lobe rhonchi.  Cardiovascular Auscultation:Rythm- Regular, rate and rhythm. Murmurs & Other Heart Sounds:Ausculatation of the heart reveal- No Murmurs.  Lymphatic Head & Neck General Head & Neck Lymphatics: Bilateral: Description- No Localized lymphadenopathy.       Assessment & Plan:  Your cough persists and some concern that may have bronchitis. I will rx cephalexin antibiotic. This may treat sinusitis as well.(If occuring early since you sound very nasal congested)  For nasal congestion rx flonase. For cough rx benzonatate.  I though you had some component of reactive airways on last visit. This still may be the  case. Continue atrovent. Use albuterol if needed.  Please get cxr today.  Follow up 7 days or as needed. You could call us Monday give update. Any severe changing/worsening symptoms over weekend then ED evaluation.

## 2015-02-02 NOTE — Telephone Encounter (Signed)
Spoke with pt and scheduled a follow-up with ES, PA.

## 2015-02-14 ENCOUNTER — Ambulatory Visit (INDEPENDENT_AMBULATORY_CARE_PROVIDER_SITE_OTHER): Payer: Medicare Other

## 2015-02-14 DIAGNOSIS — I4891 Unspecified atrial fibrillation: Secondary | ICD-10-CM

## 2015-02-14 DIAGNOSIS — I480 Paroxysmal atrial fibrillation: Secondary | ICD-10-CM | POA: Diagnosis not present

## 2015-02-14 DIAGNOSIS — Z5181 Encounter for therapeutic drug level monitoring: Secondary | ICD-10-CM

## 2015-02-14 LAB — POCT INR: INR: 4

## 2015-03-03 ENCOUNTER — Ambulatory Visit (INDEPENDENT_AMBULATORY_CARE_PROVIDER_SITE_OTHER): Payer: Medicare Other | Admitting: *Deleted

## 2015-03-03 DIAGNOSIS — I480 Paroxysmal atrial fibrillation: Secondary | ICD-10-CM | POA: Diagnosis not present

## 2015-03-03 DIAGNOSIS — Z5181 Encounter for therapeutic drug level monitoring: Secondary | ICD-10-CM

## 2015-03-03 DIAGNOSIS — I4891 Unspecified atrial fibrillation: Secondary | ICD-10-CM | POA: Diagnosis not present

## 2015-03-03 LAB — POCT INR: INR: 2.4

## 2015-03-10 DIAGNOSIS — H35363 Drusen (degenerative) of macula, bilateral: Secondary | ICD-10-CM | POA: Diagnosis not present

## 2015-03-10 DIAGNOSIS — H3531 Nonexudative age-related macular degeneration: Secondary | ICD-10-CM | POA: Diagnosis not present

## 2015-03-10 DIAGNOSIS — H5203 Hypermetropia, bilateral: Secondary | ICD-10-CM | POA: Diagnosis not present

## 2015-03-24 ENCOUNTER — Ambulatory Visit (INDEPENDENT_AMBULATORY_CARE_PROVIDER_SITE_OTHER): Payer: Medicare Other | Admitting: *Deleted

## 2015-03-24 DIAGNOSIS — I4891 Unspecified atrial fibrillation: Secondary | ICD-10-CM

## 2015-03-24 DIAGNOSIS — Z5181 Encounter for therapeutic drug level monitoring: Secondary | ICD-10-CM | POA: Diagnosis not present

## 2015-03-24 DIAGNOSIS — I480 Paroxysmal atrial fibrillation: Secondary | ICD-10-CM | POA: Diagnosis not present

## 2015-03-24 LAB — POCT INR: INR: 1.6

## 2015-04-10 ENCOUNTER — Ambulatory Visit (INDEPENDENT_AMBULATORY_CARE_PROVIDER_SITE_OTHER): Payer: Medicare Other | Admitting: *Deleted

## 2015-04-10 DIAGNOSIS — Z5181 Encounter for therapeutic drug level monitoring: Secondary | ICD-10-CM | POA: Diagnosis not present

## 2015-04-10 DIAGNOSIS — I4891 Unspecified atrial fibrillation: Secondary | ICD-10-CM

## 2015-04-10 DIAGNOSIS — I480 Paroxysmal atrial fibrillation: Secondary | ICD-10-CM

## 2015-04-10 LAB — POCT INR: INR: 2.4

## 2015-04-19 ENCOUNTER — Other Ambulatory Visit: Payer: Self-pay | Admitting: Cardiology

## 2015-05-01 ENCOUNTER — Ambulatory Visit (INDEPENDENT_AMBULATORY_CARE_PROVIDER_SITE_OTHER): Payer: Medicare Other | Admitting: *Deleted

## 2015-05-01 DIAGNOSIS — I480 Paroxysmal atrial fibrillation: Secondary | ICD-10-CM | POA: Diagnosis not present

## 2015-05-01 DIAGNOSIS — I4891 Unspecified atrial fibrillation: Secondary | ICD-10-CM | POA: Diagnosis not present

## 2015-05-01 DIAGNOSIS — Z5181 Encounter for therapeutic drug level monitoring: Secondary | ICD-10-CM | POA: Diagnosis not present

## 2015-05-01 LAB — POCT INR: INR: 2.2

## 2015-05-09 ENCOUNTER — Other Ambulatory Visit: Payer: Self-pay | Admitting: Cardiology

## 2015-05-29 ENCOUNTER — Ambulatory Visit (INDEPENDENT_AMBULATORY_CARE_PROVIDER_SITE_OTHER): Payer: Medicare Other | Admitting: *Deleted

## 2015-05-29 DIAGNOSIS — I480 Paroxysmal atrial fibrillation: Secondary | ICD-10-CM | POA: Diagnosis not present

## 2015-05-29 DIAGNOSIS — I4891 Unspecified atrial fibrillation: Secondary | ICD-10-CM

## 2015-05-29 DIAGNOSIS — Z5181 Encounter for therapeutic drug level monitoring: Secondary | ICD-10-CM | POA: Diagnosis not present

## 2015-05-29 LAB — POCT INR: INR: 1.6

## 2015-06-14 ENCOUNTER — Ambulatory Visit (INDEPENDENT_AMBULATORY_CARE_PROVIDER_SITE_OTHER): Payer: Medicare Other | Admitting: Pharmacist

## 2015-06-14 DIAGNOSIS — I4891 Unspecified atrial fibrillation: Secondary | ICD-10-CM

## 2015-06-14 DIAGNOSIS — I480 Paroxysmal atrial fibrillation: Secondary | ICD-10-CM | POA: Diagnosis not present

## 2015-06-14 DIAGNOSIS — Z5181 Encounter for therapeutic drug level monitoring: Secondary | ICD-10-CM

## 2015-06-14 LAB — POCT INR: INR: 2.1

## 2015-06-16 MED FILL — WARFARIN SODIUM 5 MG TABLET: 5 | 30 days supply | Qty: 30 | Fill #1

## 2015-07-02 ENCOUNTER — Emergency Department (HOSPITAL_BASED_OUTPATIENT_CLINIC_OR_DEPARTMENT_OTHER)
Admission: EM | Admit: 2015-07-02 | Discharge: 2015-07-02 | Disposition: A | Discharge status: Home / Self Care | Payer: Medicare Other | Attending: Emergency Medicine | Admitting: Emergency Medicine

## 2015-07-02 ENCOUNTER — Emergency Department (HOSPITAL_BASED_OUTPATIENT_CLINIC_OR_DEPARTMENT_OTHER): Payer: Medicare Other

## 2015-07-02 ENCOUNTER — Encounter (HOSPITAL_BASED_OUTPATIENT_CLINIC_OR_DEPARTMENT_OTHER): Payer: Self-pay | Admitting: *Deleted

## 2015-07-02 DIAGNOSIS — R509 Fever, unspecified: Secondary | ICD-10-CM | POA: Diagnosis not present

## 2015-07-02 DIAGNOSIS — Z7901 Long term (current) use of anticoagulants: Secondary | ICD-10-CM | POA: Insufficient documentation

## 2015-07-02 DIAGNOSIS — Z79899 Other long term (current) drug therapy: Secondary | ICD-10-CM | POA: Diagnosis not present

## 2015-07-02 DIAGNOSIS — Z85828 Personal history of other malignant neoplasm of skin: Secondary | ICD-10-CM | POA: Diagnosis not present

## 2015-07-02 DIAGNOSIS — E559 Vitamin D deficiency, unspecified: Secondary | ICD-10-CM | POA: Insufficient documentation

## 2015-07-02 DIAGNOSIS — J40 Bronchitis, not specified as acute or chronic: Secondary | ICD-10-CM | POA: Insufficient documentation

## 2015-07-02 DIAGNOSIS — J209 Acute bronchitis, unspecified: Secondary | ICD-10-CM

## 2015-07-02 DIAGNOSIS — R630 Anorexia: Secondary | ICD-10-CM | POA: Diagnosis not present

## 2015-07-02 DIAGNOSIS — I4891 Unspecified atrial fibrillation: Secondary | ICD-10-CM | POA: Insufficient documentation

## 2015-07-02 DIAGNOSIS — I10 Essential (primary) hypertension: Secondary | ICD-10-CM | POA: Insufficient documentation

## 2015-07-02 DIAGNOSIS — R05 Cough: Secondary | ICD-10-CM | POA: Diagnosis not present

## 2015-07-02 DIAGNOSIS — R51 Headache: Secondary | ICD-10-CM | POA: Diagnosis not present

## 2015-07-02 DIAGNOSIS — J9801 Acute bronchospasm: Secondary | ICD-10-CM | POA: Insufficient documentation

## 2015-07-02 DIAGNOSIS — R0602 Shortness of breath: Secondary | ICD-10-CM | POA: Diagnosis not present

## 2015-07-02 DIAGNOSIS — R5383 Other fatigue: Secondary | ICD-10-CM | POA: Insufficient documentation

## 2015-07-02 DIAGNOSIS — R531 Weakness: Secondary | ICD-10-CM | POA: Insufficient documentation

## 2015-07-02 LAB — CBC WITH DIFFERENTIAL/PLATELET
Basophils Absolute: 0 10*3/uL (ref 0.0–0.1)
Basophils Relative: 0 %
Eosinophils Absolute: 0.2 10*3/uL (ref 0.0–0.7)
Eosinophils Relative: 2 %
HCT: 42.4 % (ref 36.0–46.0)
Hemoglobin: 13.9 g/dL (ref 12.0–15.0)
LYMPHS PCT: 46 %
Lymphs Abs: 3 10*3/uL (ref 0.7–4.0)
MCH: 30 pg (ref 26.0–34.0)
MCHC: 32.8 g/dL (ref 30.0–36.0)
MCV: 91.6 fL (ref 78.0–100.0)
Monocytes Absolute: 0.7 10*3/uL (ref 0.1–1.0)
Monocytes Relative: 10 %
NEUTROS ABS: 2.8 10*3/uL (ref 1.7–7.7)
NEUTROS PCT: 42 %
PLATELETS: 229 10*3/uL (ref 150–400)
RBC: 4.63 MIL/uL (ref 3.87–5.11)
RDW: 13.8 % (ref 11.5–15.5)
WBC: 6.7 10*3/uL (ref 4.0–10.5)

## 2015-07-02 LAB — PROTIME-INR
INR: 1.98 — AB (ref 0.00–1.49)
Prothrombin Time: 22.4 seconds — ABNORMAL HIGH (ref 11.6–15.2)

## 2015-07-02 LAB — TROPONIN I

## 2015-07-02 LAB — COMPREHENSIVE METABOLIC PANEL
ALT: 7 U/L — ABNORMAL LOW (ref 14–54)
ANION GAP: 8 (ref 5–15)
AST: 19 U/L (ref 15–41)
Albumin: 3.7 g/dL (ref 3.5–5.0)
Alkaline Phosphatase: 74 U/L (ref 38–126)
BUN: 14 mg/dL (ref 6–20)
CHLORIDE: 105 mmol/L (ref 101–111)
CO2: 27 mmol/L (ref 22–32)
Calcium: 9.4 mg/dL (ref 8.9–10.3)
Creatinine, Ser: 0.72 mg/dL (ref 0.44–1.00)
Glucose, Bld: 97 mg/dL (ref 65–99)
POTASSIUM: 4.2 mmol/L (ref 3.5–5.1)
Sodium: 140 mmol/L (ref 135–145)
Total Bilirubin: 0.7 mg/dL (ref 0.3–1.2)
Total Protein: 6.5 g/dL (ref 6.5–8.1)

## 2015-07-02 LAB — BRAIN NATRIURETIC PEPTIDE: B NATRIURETIC PEPTIDE 5: 141.3 pg/mL — AB (ref 0.0–100.0)

## 2015-07-02 MED ORDER — METHYLPREDNISOLONE SODIUM SUCC 125 MG IJ SOLR: 125.0000 mg | Freq: Once | INTRAMUSCULAR | Status: DC

## 2015-07-02 MED ORDER — ALBUTEROL SULFATE (2.5 MG/3ML) 0.083% IN NEBU
5.0000 mg | INHALATION_SOLUTION | Freq: Once | RESPIRATORY_TRACT | Status: AC
  Administered 2015-07-02: 5 mg via RESPIRATORY_TRACT
  Filled 2015-07-02: qty 6

## 2015-07-02 MED ORDER — ALBUTEROL SULFATE HFA 108 (90 BASE) MCG/ACT IN AERS
1.0000 | INHALATION_SPRAY | RESPIRATORY_TRACT | Status: DC | PRN
  Administered 2015-07-02: 2 via RESPIRATORY_TRACT
  Filled 2015-07-02: qty 6.7

## 2015-07-02 MED ORDER — PREDNISONE 20 MG PO TABS: 40.0000 mg | ORAL_TABLET | Freq: Every day | ORAL | Status: DC

## 2015-07-02 MED ORDER — METHYLPREDNISOLONE SODIUM SUCC 125 MG IJ SOLR
125.0000 mg | Freq: Once | INTRAMUSCULAR | Status: AC
  Administered 2015-07-02: 125 mg via INTRAVENOUS
  Filled 2015-07-02: qty 2

## 2015-07-02 NOTE — Discharge Instructions (Signed)
Upper Respiratory Infection, Adult  Most upper respiratory infections (URIs) are a viral infection of the air passages leading to the lungs. A URI affects the nose, throat, and upper air passages. The most common type of URI is nasopharyngitis and is typically referred to as "the common cold."  URIs run their course and usually go away on their own. Most of the time, a URI does not require medical attention, but sometimes a bacterial infection in the upper airways can follow a viral infection. This is called a secondary infection. Sinus and middle ear infections are common types of secondary upper respiratory infections.  Bacterial pneumonia can also complicate a URI. A URI can worsen asthma and chronic obstructive pulmonary disease (COPD). Sometimes, these complications can require emergency medical care and may be life threatening.   CAUSES  Almost all URIs are caused by viruses. A virus is a type of germ and can spread from one person to another.   RISKS FACTORS  You may be at risk for a URI if:    You smoke.    You have chronic heart or lung disease.   You have a weakened defense (immune) system.    You are very young or very old.    You have nasal allergies or asthma.   You work in crowded or poorly ventilated areas.   You work in health care facilities or schools.  SIGNS AND SYMPTOMS   Symptoms typically develop 2-3 days after you come in contact with a cold virus. Most viral URIs last 7-10 days. However, viral URIs from the influenza virus (flu virus) can last 14-18 days and are typically more severe. Symptoms may include:    Runny or stuffy (congested) nose.    Sneezing.    Cough.    Sore throat.    Headache.    Fatigue.    Fever.    Loss of appetite.    Pain in your forehead, behind your eyes, and over your cheekbones (sinus pain).   Muscle aches.   DIAGNOSIS   Your health care provider may diagnose a URI by:   Physical exam.   Tests to check that your symptoms are not due to  another condition such as:   Strep throat.   Sinusitis.   Pneumonia.   Asthma.  TREATMENT   A URI goes away on its own with time. It cannot be cured with medicines, but medicines may be prescribed or recommended to relieve symptoms. Medicines may help:   Reduce your fever.   Reduce your cough.   Relieve nasal congestion.  HOME CARE INSTRUCTIONS    Take medicines only as directed by your health care provider.    Gargle warm saltwater or take cough drops to comfort your throat as directed by your health care provider.   Use a warm mist humidifier or inhale steam from a shower to increase air moisture. This may make it easier to breathe.   Drink enough fluid to keep your urine clear or pale yellow.    Eat soups and other clear broths and maintain good nutrition.    Rest as needed.    Return to work when your temperature has returned to normal or as your health care provider advises. You may need to stay home longer to avoid infecting others. You can also use a face mask and careful hand washing to prevent spread of the virus.   Increase the usage of your inhaler if you have asthma.    Do not   use any tobacco products, including cigarettes, chewing tobacco, or electronic cigarettes. If you need help quitting, ask your health care provider.  PREVENTION   The best way to protect yourself from getting a cold is to practice good hygiene.    Avoid oral or hand contact with people with cold symptoms.    Wash your hands often if contact occurs.   There is no clear evidence that vitamin C, vitamin E, echinacea, or exercise reduces the chance of developing a cold. However, it is always recommended to get plenty of rest, exercise, and practice good nutrition.   SEEK MEDICAL CARE IF:    You are getting worse rather than better.    Your symptoms are not controlled by medicine.    You have chills.   You have worsening shortness of breath.   You have brown or red mucus.   You have yellow or brown nasal  discharge.   You have pain in your face, especially when you bend forward.   You have a fever.   You have swollen neck glands.   You have pain while swallowing.   You have white areas in the back of your throat.  SEEK IMMEDIATE MEDICAL CARE IF:    You have severe or persistent:    Headache.    Ear pain.    Sinus pain.    Chest pain.   You have chronic lung disease and any of the following:    Wheezing.    Prolonged cough.    Coughing up blood.    A change in your usual mucus.   You have a stiff neck.   You have changes in your:    Vision.    Hearing.    Thinking.    Mood.  MAKE SURE YOU:    Understand these instructions.   Will watch your condition.   Will get help right away if you are not doing well or get worse.     This information is not intended to replace advice given to you by your health care provider. Make sure you discuss any questions you have with your health care provider.     Document Released: 11/20/2000 Document Revised: 10/11/2014 Document Reviewed: 09/01/2013  Elsevier Interactive Patient Education 2016 [employer]    Bronchospasm, Adult  A bronchospasm is a spasm or tightening of the airways going into the lungs. During a bronchospasm breathing becomes more difficult because the airways get smaller. When this happens there can be coughing, a whistling sound when breathing (wheezing), and difficulty breathing. Bronchospasm is often associated with asthma, but not all patients who experience a bronchospasm have asthma.  CAUSES   A bronchospasm is caused by inflammation or irritation of the airways. The inflammation or irritation may be triggered by:    Allergies (such as to animals, pollen, food, or mold). Allergens that cause bronchospasm may cause wheezing immediately after exposure or many hours later.    Infection. Viral infections are believed to be the most common cause of bronchospasm.    Exercise.    Irritants (such as pollution, cigarette smoke, strong odors, aerosol  sprays, and paint fumes).    Weather changes. Winds increase molds and pollens in the air. Rain refreshes the air by washing irritants out. Cold air may cause inflammation.    Stress and emotional upset.   SIGNS AND SYMPTOMS    Wheezing.    Excessive nighttime coughing.    Frequent or severe coughing with a simple cold.      Chest tightness.    Shortness of breath.   DIAGNOSIS   Bronchospasm is usually diagnosed through a history and physical exam. Tests, such as chest X-rays, are sometimes done to look for other conditions.  TREATMENT    Inhaled medicines can be given to open up your airways and help you breathe. The medicines can be given using either an inhaler or a nebulizer machine.   Corticosteroid medicines may be given for severe bronchospasm, usually when it is associated with asthma.  HOME CARE INSTRUCTIONS    Always have a plan prepared for seeking medical care. Know when to call your health care provider and local emergency services (911 in the [place] Know where you can access local emergency care.   Only take medicines as directed by your health care provider.   If you were prescribed an inhaler or nebulizer machine, ask your health care provider to explain how to use it correctly. Always use a spacer with your inhaler if you were given one.   It is necessary to remain calm during an attack. Try to relax and breathe more slowly.   Control your home environment in the following ways:     Change your heating and air conditioning filter at least once a month.     Limit your use of fireplaces and wood stoves.    Do not smoke and do not allow smoking in your home.     Avoid exposure to perfumes and fragrances.     Get rid of pests (such as roaches and mice) and their droppings.     Throw away plants if you see mold on them.     Keep your house clean and dust free.     Replace carpet with wood, tile, or vinyl flooring. Carpet can trap dander and dust.     Use allergy-proof  pillows, mattress covers, and box spring covers.     Wash bed sheets and blankets every week in hot water and dry them in a dryer.     Use blankets that are made of polyester or cotton.     Wash hands frequently.  SEEK MEDICAL CARE IF:    You have muscle aches.    You have chest pain.    The sputum changes from clear or white to yellow, green, gray, or bloody.    The sputum you cough up gets thicker.    There are problems that may be related to the medicine you are given, such as a rash, itching, swelling, or trouble breathing.   SEEK IMMEDIATE MEDICAL CARE IF:    You have worsening wheezing and coughing even after taking your prescribed medicines.    You have increased difficulty breathing.    You develop severe chest pain.  MAKE SURE YOU:    Understand these instructions.   Will watch your condition.   Will get help right away if you are not doing well or get worse.     This information is not intended to replace advice given to you by your health care provider. Make sure you discuss any questions you have with your health care provider.     Document Released: 05/30/2003 Document Revised: 06/17/2014 Document Reviewed: 11/16/2012  Elsevier Interactive Patient Education 2016 [employer]

## 2015-07-02 NOTE — ED Provider Notes (Signed)
CSN: [id number]     Arrival date & time 07/02/15  1814 History  By signing my name below, I, Helane Gunther, attest that this documentation has been prepared under the direction and in the presence of Julianne Rice, MD. Electronically Signed: Helane Gunther, ED Scribe. 07/02/2015. 6:51 PM.    Chief Complaint  Patient presents with  . URI  . Cough   The history is provided by the patient and a relative. No language interpreter was used.   HPI Comments: KRYSTN SOLANO is a 82 y.o. female with a PMHx of A-fib and HTN who presents to the Emergency Department complaining of URI and cough onset 3 weeks ago. She reports associated chest congestion, rhinorrhea (imoproving), chest heaviness, intermittent wheezing, loss of appetite, fatigue, subjective fever,and mild occasional headache. She has been taking Alka Seltzer cold plus, airborne lozenge, and Zicam without relief. She notes a PMHx of the same which then turned into bronchitis. She notes her legs are sometimes swollen due to her Hx of A-fib. Per relative, pt has a PMHx of PNA several years ago. Pt denies chills and sinus pressure.   Past Medical History  Diagnosis Date  . Normal cardiac stress test 01/27/09    lvef 73% done at [facility] (nuclear stress)  . Atrial fibrillation ([place])     on Coumadin since mid 2010  . HTN (hypertension)   . Incontinence   . Vitamin D deficiency 12/13/2011  . Squamous cell carcinoma ([place]) 10/14    left side of nose  . Macular degeneration    Past Surgical History  Procedure Laterality Date  . Laminectomy  1989 and 1990    L4, L5  . Bilateral mammoplasty    . Tonsillectomy and adenoidectomy  1941  . Abdominal hysterectomy  2007  . Arm skin lesion biopsy / excision      pre-cancerous   Family History  Problem Relation Age of Onset  . Hypertension Mother   . COPD Father     smoker  . Cancer Father     lung  . Cancer Sister     breast  . Bell's palsy Sister   . Heart disease Sister     CHF  .  Cancer Daughter     breast/ stage 1- s/p lumpectomy and radiation  . Cancer Other     Hodgkins Disease  . Cancer Daughter    Social History  Substance Use Topics  . Smoking status: Passive Smoke Exposure - Never Smoker  . Smokeless tobacco: Never Used     Comment: Lived with smokers   . Alcohol Use: 0.0 oz/week    0 Standard drinks or equivalent per week     Comment: rare- 1 drink/ month   OB History    No data available     Review of Systems  Constitutional: Positive for fever (subjective), appetite change and fatigue. Negative for chills.  HENT: Positive for congestion and rhinorrhea. Negative for sinus pressure.   Respiratory: Positive for cough, chest tightness, shortness of breath and wheezing.   Cardiovascular: Negative for chest pain, palpitations and leg swelling.  Gastrointestinal: Negative for nausea, vomiting, abdominal pain and diarrhea.  Genitourinary: Negative for dysuria, frequency and flank pain.  Musculoskeletal: Negative for myalgias, back pain, arthralgias, neck pain and neck stiffness.  Skin: Negative for rash and wound.  Neurological: Positive for weakness (generalized) and headaches. Negative for dizziness, light-headedness and numbness.  All other systems reviewed and are negative.   Allergies  Review of patient's allergies  indicates no known allergies.  Home Medications   Prior to Admission medications   Medication Sig Start Date End Date Taking? Authorizing Provider  diltiazem (CARDIZEM) 120 MG tablet TAKE 1 TABLET (120 MG TOTAL) BY MOUTH DAILY. 04/19/15  Yes Lelon Perla, MD  warfarin (COUMADIN) 5 MG tablet Take 1 tablet by mouth daily or as directed by coumadin clinic 05/09/15  Yes Lelon Perla, MD  cholecalciferol (VITAMIN D) 1000 UNITS tablet Take 3,000 Units by mouth daily.    Historical Provider, MD  multivitamin-lutein (OCUVITE-LUTEIN) CAPS capsule Take 4 capsules by mouth daily.    Historical Provider, MD  predniSONE (DELTASONE) 20 MG  tablet Take 2 tablets (40 mg total) by mouth daily. 07/02/15   Julianne Rice, MD  VITAMIN E PO Take 1 capsule by mouth daily.    Historical Provider, MD   BP 131/62 mmHg  Pulse 79  Temp(Src) 97.9 F (36.6 C) (Oral)  Resp 18  Ht 5\' 7"  (1.702 m)  Wt 170 lb (77.111 kg)  BMI 26.62 kg/m2  SpO2 95% Physical Exam  Constitutional: She is oriented to person, place, and time. She appears well-developed and well-nourished. No distress.  HENT:  Head: Normocephalic and atraumatic.  Mouth/Throat: Oropharynx is clear and moist. No oropharyngeal exudate.  Eyes: EOM are normal. Pupils are equal, round, and reactive to light.  Neck: Normal range of motion. Neck supple. No JVD present.  Cardiovascular: Normal rate and regular rhythm.  Exam reveals no gallop and no friction rub.   No murmur heard. Pulmonary/Chest: Effort normal. No stridor. No respiratory distress. She has wheezes. She has no rales.  Expiratory wheezing throughout  Abdominal: Soft. Bowel sounds are normal. She exhibits no distension and no mass. There is no tenderness. There is no rebound and no guarding.  Musculoskeletal: Normal range of motion. She exhibits no edema or tenderness.  No lower extremity edema or calf tenderness/asymmetry. Distal pulses equal and intact.  Neurological: She is alert and oriented to person, place, and time.  5/5 motor in all extremities. Sensation is fully intact.  Skin: Skin is warm and dry. No rash noted. No erythema.  Psychiatric: She has a normal mood and affect. Her behavior is normal.  Nursing note and vitals reviewed.   ED Course  Procedures  DIAGNOSTIC STUDIES: Oxygen Saturation is 97% on RA, adequate by my interpretation.    COORDINATION OF CARE: 6:47 PM - Discussed plans to order a breathing treatment as well as diagnostic studies and imaging to r/o PNA. Pt advised of plan for treatment and pt agrees.  Labs Review Labs Reviewed  COMPREHENSIVE METABOLIC PANEL - Abnormal; Notable for the  following:    ALT 7 (*)    All other components within normal limits  BRAIN NATRIURETIC PEPTIDE - Abnormal; Notable for the following:    B Natriuretic Peptide 141.3 (*)    All other components within normal limits  PROTIME-INR - Abnormal; Notable for the following:    Prothrombin Time 22.4 (*)    INR 1.98 (*)    All other components within normal limits  CBC WITH DIFFERENTIAL/PLATELET  TROPONIN I    Imaging Review Dg Chest 2 View  07/02/2015  CLINICAL DATA:  Upper respiratory infection and cough for 3 weeks, fatigue, subjective fever EXAM: CHEST  2 VIEW COMPARISON:  02/02/2015 FINDINGS: Mild cardiac enlargement stable. Vascular pattern normal. Calcified breast implants stable. Stable mild elevation of left diaphragm. Right lung is clear. Band of opacity extending into the left hilum new from  prior study. This could represent subsegmental atelectasis. IMPRESSION: Probable subsegmental atelectasis in the left perihilar area. Otherwise no acute findings. Electronically Signed   By: Skipper Cliche M.D.   On: 07/02/2015 19:05   I have personally reviewed and evaluated these images and lab results as part of my medical decision-making.   EKG Interpretation   Date/Time:  Sunday July 02 2015 19:33:43 EST Ventricular Rate:  74 PR Interval:  171 QRS Duration: 97 QT Interval:  432 QTC Calculation: 479 R Axis:   26 Text Interpretation:  Sinus rhythm Abnormal R-wave progression, early  transition Confirmed by Lita Mains  MD, Kayliana Codd ([id number]) on 07/02/2015 8:40:32  PM      MDM   Final diagnoses:  Bronchitis with bronchospasm      I personally performed the services described in this documentation, which was scribed in my presence. The recorded information has been reviewed and is accurate.   After neb, patient with improved air movement and decreased wheezing. States she is feeling much better. She does have some unsteadiness and shakiness. This is likely from the albuterol. We'll  continue to observe but anticipate discharge home with albuterol inhaler and short course of steroids.  Patient is asking to be discharged. States she feels much better. Given return precautions and advised to follow with her primary physician.  Julianne Rice, MD 07/02/15 2204

## 2015-07-02 NOTE — ED Notes (Signed)
Pt reports 3 weeks of cold sx, cough, lightheaded, fatigue

## 2015-07-03 MED FILL — predniSONE 20 MG TABS: 20 | 4 days supply | Qty: 8 | Fill #0

## 2015-07-07 ENCOUNTER — Ambulatory Visit (INDEPENDENT_AMBULATORY_CARE_PROVIDER_SITE_OTHER): Payer: Medicare Other | Admitting: Family

## 2015-07-07 ENCOUNTER — Encounter: Payer: Self-pay | Admitting: Family

## 2015-07-07 VITALS — BP 125/55 | HR 72 | Temp 98.3°F | Resp 16 | Ht 65.5 in | Wt 173.8 lb

## 2015-07-07 DIAGNOSIS — J40 Bronchitis, not specified as acute or chronic: Secondary | ICD-10-CM | POA: Diagnosis not present

## 2015-07-07 DIAGNOSIS — J209 Acute bronchitis, unspecified: Secondary | ICD-10-CM

## 2015-07-07 NOTE — Progress Notes (Signed)
Subjective:    Patient ID: Carla Curtis, female    DOB: [birth year], 82 y.o.   MRN: [id number]  HPI   Carla Curtis is an 82 yr old female who presents today for ED follow up. 07/02/15 ED note is reviewed.  At that time she reported a 3 week hx of uri and cough symptoms. She was diagnosed with viral bronchitis/bronchospasm. She was sent home with rx for prn albuterol and a short course of steroids. She reports continued improvement in her respiratory symptoms.  Denies fever or otalgia. Does have some mild clear rhinorrhea.      Review of Systems See HPI  Past Medical History  Diagnosis Date  . Normal cardiac stress test 01/27/09    lvef 73% done at [facility] (nuclear stress)  . Atrial fibrillation ([place])     on Coumadin since mid 2010  . HTN (hypertension)   . Incontinence   . Vitamin D deficiency 12/13/2011  . Squamous cell carcinoma ([place]) 10/14    left side of nose  . Macular degeneration     Social History   Social History  . Marital Status: Widowed    Spouse Name: N/A  . Number of Children: 2  . Years of Education: N/A   Occupational History  .     Social History Main Topics  . Smoking status: Passive Smoke Exposure - Never Smoker  . Smokeless tobacco: Never Used     Comment: Lived with smokers   . Alcohol Use: 0.0 oz/week    0 Standard drinks or equivalent per week     Comment: rare- 1 drink/ month  . Drug Use: No  . Sexual Activity: No   Other Topics Concern  . Not on file   Social History Narrative   Regular Exercise- no    Past Surgical History  Procedure Laterality Date  . Laminectomy  1989 and 1990    L4, L5  . Bilateral mammoplasty    . Tonsillectomy and adenoidectomy  1941  . Abdominal hysterectomy  2007  . Arm skin lesion biopsy / excision      pre-cancerous    Family History  Problem Relation Age of Onset  . Hypertension Mother   . COPD Father     smoker  . Cancer Father     lung  . Cancer Sister     breast  . Bell's palsy Sister   .  Heart disease Sister     CHF  . Cancer Daughter     breast/ stage 1- s/p lumpectomy and radiation  . Cancer Other     Hodgkins Disease  . Cancer Daughter     No Known Allergies  Current Outpatient Prescriptions on File Prior to Visit  Medication Sig Dispense Refill  . diltiazem (CARDIZEM) 120 MG tablet TAKE 1 TABLET (120 MG TOTAL) BY MOUTH DAILY. 90 tablet 0  . multivitamin-lutein (OCUVITE-LUTEIN) CAPS capsule Take 4 capsules by mouth daily.    Marland Kitchen warfarin (COUMADIN) 5 MG tablet Take 1 tablet by mouth daily or as directed by coumadin clinic 30 tablet 3   No current facility-administered medications on file prior to visit.    BP 125/55 mmHg  Pulse 72  Temp(Src) 98.3 F (36.8 C) (Oral)  Resp 16  Ht 5' 5.5" (1.664 m)  Wt 173 lb 12.8 oz (78.835 kg)  BMI 28.47 kg/m2  SpO2 98%       Objective:   Physical Exam  Constitutional: She is oriented to person, place, and  time. She appears well-developed and well-nourished.  HENT:  Right Ear: Tympanic membrane and ear canal normal.  Left Ear: Tympanic membrane and ear canal normal.  Mouth/Throat: No oropharyngeal exudate, posterior oropharyngeal edema or posterior oropharyngeal erythema.  Cardiovascular: Normal rate, regular rhythm and normal heart sounds.   No murmur heard. Pulmonary/Chest: Effort normal and breath sounds normal. No respiratory distress. She has no wheezes.  Musculoskeletal: She exhibits no edema.  Neurological: She is alert and oriented to person, place, and time.  Skin: Skin is warm and dry.  Psychiatric: She has a normal mood and affect. Her behavior is normal. Judgment and thought content normal.          Assessment & Plan:  Bronchitis with bronchospasm.  Pt reports continued improvement in her symptoms. Exam normal today. Advised pt to continue albuterol prn and call if symptoms do not continue to improve.

## 2015-07-07 NOTE — Progress Notes (Signed)
Pre visit review using our clinic review tool, if applicable. No additional management support is needed unless otherwise documented below in the visit note. 

## 2015-07-07 NOTE — Patient Instructions (Signed)
Follow up after 01/10/16 for annual medicare wellness visit. Call if cough does not continue to improve.

## 2015-07-10 ENCOUNTER — Ambulatory Visit (INDEPENDENT_AMBULATORY_CARE_PROVIDER_SITE_OTHER): Payer: Medicare Other | Admitting: *Deleted

## 2015-07-10 DIAGNOSIS — I4891 Unspecified atrial fibrillation: Secondary | ICD-10-CM

## 2015-07-10 DIAGNOSIS — Z5181 Encounter for therapeutic drug level monitoring: Secondary | ICD-10-CM | POA: Diagnosis not present

## 2015-07-10 LAB — POCT INR: INR: 3.1

## 2015-07-17 ENCOUNTER — Telehealth: Payer: Self-pay | Admitting: Family

## 2015-07-17 ENCOUNTER — Encounter: Payer: Self-pay | Admitting: Family

## 2015-07-17 ENCOUNTER — Ambulatory Visit (INDEPENDENT_AMBULATORY_CARE_PROVIDER_SITE_OTHER): Payer: Medicare Other | Admitting: Family

## 2015-07-17 VITALS — BP 122/52 | HR 78 | Temp 98.9°F | Resp 18 | Ht 65.5 in | Wt 174.4 lb

## 2015-07-17 DIAGNOSIS — Z1159 Encounter for screening for other viral diseases: Secondary | ICD-10-CM | POA: Diagnosis not present

## 2015-07-17 DIAGNOSIS — J209 Acute bronchitis, unspecified: Secondary | ICD-10-CM | POA: Diagnosis not present

## 2015-07-17 LAB — HEPATITIS C ANTIBODY: HCV Ab: NEGATIVE

## 2015-07-17 MED ORDER — DOXYCYCLINE HYCLATE 100 MG PO TABS: 100.0000 mg | ORAL_TABLET | Freq: Two times a day (BID) | ORAL | Status: DC

## 2015-07-17 MED FILL — DOXYCYCLINE 100 MG TABLET: 100 | 10 days supply | Qty: 20 | Fill #0

## 2015-07-17 NOTE — Telephone Encounter (Signed)
Received message from Debbrah Alar that pt is starting doxycycline 100mg  BID x10 days. Called pt and moved up her next Coumadin appt to Thursday after she has had 3 days worth of doxycycline.

## 2015-07-17 NOTE — Progress Notes (Signed)
Pre visit review using our clinic review tool, if applicable. No additional management support is needed unless otherwise documented below in the visit note. 

## 2015-07-17 NOTE — Telephone Encounter (Signed)
FYI, patient is starting doxycycline today for bronchitis. You are following her PT/INR. Thanks!

## 2015-07-17 NOTE — Patient Instructions (Signed)
Please contact cardiology and arrange follow up with Dr. Stanford Breed. [phone] Start doxycycline for bronchitis. You may use Delsym as needed for cough, mucinex as needed for chest congestion.  Call if symptoms worsen, if you develop fever, or if symptoms are not improved in 1 week.

## 2015-07-17 NOTE — Progress Notes (Signed)
Subjective:    Patient ID: Carla Curtis, female    DOB: May 31, 1933, 82 y.o.   MRN: [id number]  HPI  [name] is an 82 yr old female who presents today for follow up of her bronchitis.  Last visit on 1/27 she reported improvement in her respiratory symptoms following rx with albuterol/short course of steroids by the ED on 07/02/15.   Pt reports that she woke on Friday Am with Sore, cough worsened. Sore throat has resolved.  Cough is described as dry. Denies fever. Reports mild SOB.  Friday night slept upright in recliner because she could  Not comfortably sleeping laying down. She reports that she slept will in her bed last night. Has tried airborne an zicam.   Review of Systems See hpi  Past Medical History  Diagnosis Date  . Normal cardiac stress test 01/27/09    lvef 73% done at [facility] (nuclear stress)  . Atrial fibrillation ([place])     on Coumadin since mid 2010  . HTN (hypertension)   . Incontinence   . Vitamin D deficiency 12/13/2011  . Squamous cell carcinoma ([place]) 10/14    left side of nose  . Macular degeneration     Social History   Social History  . Marital Status: Widowed    Spouse Name: N/A  . Number of Children: 2  . Years of Education: N/A   Occupational History  .     Social History Main Topics  . Smoking status: Passive Smoke Exposure - Never Smoker  . Smokeless tobacco: Never Used     Comment: Lived with smokers   . Alcohol Use: 0.0 oz/week    0 Standard drinks or equivalent per week     Comment: rare- 1 drink/ month  . Drug Use: No  . Sexual Activity: No   Other Topics Concern  . Not on file   Social History Narrative   Regular Exercise- no    Past Surgical History  Procedure Laterality Date  . Laminectomy  1989 and 1990    L4, L5  . Bilateral mammoplasty    . Tonsillectomy and adenoidectomy  1941  . Abdominal hysterectomy  2007  . Arm skin lesion biopsy / excision      pre-cancerous    Family History  Problem Relation Age of Onset    . Hypertension Mother   . COPD Father     smoker  . Cancer Father     lung  . Cancer Sister     breast  . Bell's palsy Sister   . Heart disease Sister     CHF  . Cancer Daughter     breast/ stage 1- s/p lumpectomy and radiation  . Cancer Other     Hodgkins Disease  . Cancer Daughter     No Known Allergies  Current Outpatient Prescriptions on File Prior to Visit  Medication Sig Dispense Refill  . diltiazem (CARDIZEM) 120 MG tablet TAKE 1 TABLET (120 MG TOTAL) BY MOUTH DAILY. 90 tablet 0  . multivitamin-lutein (OCUVITE-LUTEIN) CAPS capsule Take 4 capsules by mouth daily.    Marland Kitchen warfarin (COUMADIN) 5 MG tablet Take 1 tablet by mouth daily or as directed by coumadin clinic 30 tablet 3   No current facility-administered medications on file prior to visit.    BP 122/52 mmHg  Pulse 78  Temp(Src) 98.9 F (37.2 C) (Oral)  Resp 18  Ht 5' 5.5" (1.664 m)  Wt 174 lb 6.4 oz (79.107 kg)  BMI 28.57  kg/m2  SpO2 97%       Objective:   Physical Exam  Constitutional: She is oriented to person, place, and time. She appears well-developed and well-nourished.  HENT:  Bilateral TM's occluded by cerumen  Cardiovascular: Normal rate, regular rhythm and normal heart sounds.   No murmur heard. Pulmonary/Chest: Effort normal and breath sounds normal. No respiratory distress. She has no wheezes.  Coarse cough is noted  Musculoskeletal: She exhibits no edema.  Lymphadenopathy:    She has no cervical adenopathy.  Neurological: She is alert and oriented to person, place, and time.  Skin: Skin is warm and dry.  Psychiatric: She has a normal mood and affect. Her behavior is normal. Judgment and thought content normal.          Assessment & Plan:  Pt requests Hep C screening today. She used to work in the prison system.   Bronchitis- Rx with doxycycline, (notified coumadin clinic). Advised pt:   Start doxycycline for bronchitis. You may use Delsym as needed for cough, mucinex as needed  for chest congestion.  Call if symptoms worsen, if you develop fever, or if symptoms are not improved in 1 week.   Pt wonders re: ongoing need for long term anticoagulation- advised pt to arrange follow up with Dr. Stanford Breed.

## 2015-07-19 ENCOUNTER — Other Ambulatory Visit: Payer: Self-pay | Admitting: Cardiology

## 2015-07-19 MED FILL — dilTIAZem HCL 120 MG TABS: 120 | 30 days supply | Qty: 30 | Fill #0

## 2015-07-19 NOTE — Telephone Encounter (Signed)
Rx(s) sent to pharmacy electronically.  

## 2015-07-20 ENCOUNTER — Ambulatory Visit (INDEPENDENT_AMBULATORY_CARE_PROVIDER_SITE_OTHER): Payer: Medicare Other

## 2015-07-20 DIAGNOSIS — I480 Paroxysmal atrial fibrillation: Secondary | ICD-10-CM

## 2015-07-20 DIAGNOSIS — Z5181 Encounter for therapeutic drug level monitoring: Secondary | ICD-10-CM

## 2015-07-20 DIAGNOSIS — I4891 Unspecified atrial fibrillation: Secondary | ICD-10-CM

## 2015-07-20 LAB — POCT INR: INR: 2.6

## 2015-07-31 DIAGNOSIS — H16223 Keratoconjunctivitis sicca, not specified as Sjogren's, bilateral: Secondary | ICD-10-CM | POA: Diagnosis not present

## 2015-07-31 DIAGNOSIS — H353131 Nonexudative age-related macular degeneration, bilateral, early dry stage: Secondary | ICD-10-CM | POA: Diagnosis not present

## 2015-07-31 DIAGNOSIS — H35363 Drusen (degenerative) of macula, bilateral: Secondary | ICD-10-CM | POA: Diagnosis not present

## 2015-08-02 ENCOUNTER — Ambulatory Visit (INDEPENDENT_AMBULATORY_CARE_PROVIDER_SITE_OTHER): Payer: Medicare Other | Admitting: Cardiology

## 2015-08-02 ENCOUNTER — Encounter: Payer: Self-pay | Admitting: Cardiology

## 2015-08-02 VITALS — BP 101/66 | HR 59 | Ht 65.5 in | Wt 174.0 lb

## 2015-08-02 DIAGNOSIS — I480 Paroxysmal atrial fibrillation: Secondary | ICD-10-CM | POA: Diagnosis not present

## 2015-08-02 DIAGNOSIS — I10 Essential (primary) hypertension: Secondary | ICD-10-CM

## 2015-08-02 DIAGNOSIS — R0789 Other chest pain: Secondary | ICD-10-CM | POA: Diagnosis not present

## 2015-08-02 NOTE — Assessment & Plan Note (Signed)
No recent symptoms. Previous nuclear study negative.

## 2015-08-02 NOTE — Patient Instructions (Signed)
Your physician wants you to follow-up in: ONE YEAR WITH DR CRENSHAW You will receive a reminder letter in the mail two months in advance. If you don't receive a letter, please call our office to schedule the follow-up appointment.   If you need a refill on your cardiac medications before your next appointment, please call your pharmacy.  

## 2015-08-02 NOTE — Assessment & Plan Note (Signed)
Blood pressure controlled. Continue present medications. 

## 2015-08-02 NOTE — Progress Notes (Signed)
HPI: FU atrial fibrillation. Patient from [place]. Prior cardiac records not available. Pt was apparently having an echocardiogram and noted to be in atrial fibrillation. She has been on Cardizem and Coumadin since then. Also with chronic chest pain; had stress test in [facility] that apparently was neg. Echocardiogram here in August of 2012 showed normal LV function, mild left ventricular hypertrophy and moderate left atrial enlargement. Carotid Dopplers May 2015 showed no carotid stenosis. Nuclear study in September 2015 showed an ejection fraction of 74% and normal perfusion. Since last seen, the patient denies any dyspnea on exertion, orthopnea, PND, pedal edema, palpitations, syncope or chest pain.   Current Outpatient Prescriptions  Medication Sig Dispense Refill  . Coenzyme Q10 (CO Q 10) 10 MG CAPS Take 1 tablet by mouth daily.    . Cyanocobalamin (VITAMIN B 12 PO) Take 1 lozenge by mouth daily. 1037mcg daily    . diltiazem (CARDIZEM) 120 MG tablet Take 1 tablet (120 mg total) by mouth daily. PATIENT NEEDS TO CONTACT OFFICE FOR ADDITIONAL REFILLS 30 tablet 0  . Krill Oil Omega-3 500 MG CAPS Take 1 capsule by mouth daily.    Marland Kitchen warfarin (COUMADIN) 5 MG tablet Take 1 tablet by mouth daily or as directed by coumadin clinic 30 tablet 3   No current facility-administered medications for this visit.     Past Medical History  Diagnosis Date  . Normal cardiac stress test 01/27/09    lvef 73% done at [facility] (nuclear stress)  . Atrial fibrillation ([place])     on Coumadin since mid 2010  . HTN (hypertension)   . Incontinence   . Vitamin D deficiency 12/13/2011  . Squamous cell carcinoma ([place]) 10/14    left side of nose  . Macular degeneration     Past Surgical History  Procedure Laterality Date  . Laminectomy  1989 and 1990    L4, L5  . Bilateral mammoplasty    . Tonsillectomy and adenoidectomy  1941  . Abdominal hysterectomy  2007  . Arm skin lesion biopsy / excision     pre-cancerous    Social History   Social History  . Marital Status: Widowed    Spouse Name: N/A  . Number of Children: 2  . Years of Education: N/A   Occupational History  .     Social History Main Topics  . Smoking status: Passive Smoke Exposure - Never Smoker  . Smokeless tobacco: Never Used     Comment: Lived with smokers   . Alcohol Use: 0.0 oz/week    0 Standard drinks or equivalent per week     Comment: rare- 1 drink/ month  . Drug Use: No  . Sexual Activity: No   Other Topics Concern  . Not on file   Social History Narrative   Regular Exercise- no    Family History  Problem Relation Age of Onset  . Hypertension Mother   . COPD Father     smoker  . Cancer Father     lung  . Cancer Sister     breast  . Bell's palsy Sister   . Heart disease Sister     CHF  . Cancer Daughter     breast/ stage 1- s/p lumpectomy and radiation  . Cancer Other     Hodgkins Disease  . Cancer Daughter     ROS: no fevers or chills, productive cough, hemoptysis, dysphasia, odynophagia, melena, hematochezia, dysuria, hematuria, rash, seizure activity, orthopnea, PND, pedal edema, claudication.  Remaining systems are negative.  Physical Exam: Well-developed well-nourished in no acute distress.  Skin is warm and dry.  HEENT is normal.  Neck is supple.  Chest is clear to auscultation with normal expansion.  Cardiovascular exam is regular rate and rhythm.  Abdominal exam nontender or distended. No masses palpated. Extremities show no edema. neuro grossly intact  ECG 07/02/2015-sinus rhythm with no ST changes.

## 2015-08-02 NOTE — Assessment & Plan Note (Signed)
Patient remains in sinus rhythm. Continue Cardizem. Continue Coumadin. Recent hemoglobin normal. She is not interested in DOAC.

## 2015-08-03 DIAGNOSIS — L570 Actinic keratosis: Secondary | ICD-10-CM | POA: Diagnosis not present

## 2015-08-03 DIAGNOSIS — Z08 Encounter for follow-up examination after completed treatment for malignant neoplasm: Secondary | ICD-10-CM | POA: Diagnosis not present

## 2015-08-03 DIAGNOSIS — Z85828 Personal history of other malignant neoplasm of skin: Secondary | ICD-10-CM | POA: Diagnosis not present

## 2015-08-03 DIAGNOSIS — D1801 Hemangioma of skin and subcutaneous tissue: Secondary | ICD-10-CM | POA: Diagnosis not present

## 2015-08-18 ENCOUNTER — Telehealth: Payer: Self-pay | Admitting: Cardiology

## 2015-08-18 ENCOUNTER — Other Ambulatory Visit: Payer: Self-pay | Admitting: Cardiology

## 2015-08-18 MED FILL — dilTIAZem HCL 120 MG TABS: 120 | 30 days supply | Qty: 30 | Fill #0

## 2015-08-18 NOTE — Telephone Encounter (Signed)
New message      Calling to clarify number of refills on patient's diltiazem.  Eleven refills was sent in to the pharmacy but we also said pt needs to contact office for more refills.  Please call before 6

## 2015-08-18 NOTE — Telephone Encounter (Signed)
Spoke with pharmacy, pt does not need appt. 11 refills are fine.

## 2015-08-18 NOTE — Telephone Encounter (Signed)
Rx refill sent to pharmacy. 

## 2015-09-18 ENCOUNTER — Ambulatory Visit (INDEPENDENT_AMBULATORY_CARE_PROVIDER_SITE_OTHER): Payer: Medicare Other | Admitting: Pharmacist

## 2015-09-18 DIAGNOSIS — Z5181 Encounter for therapeutic drug level monitoring: Secondary | ICD-10-CM

## 2015-09-18 DIAGNOSIS — I480 Paroxysmal atrial fibrillation: Secondary | ICD-10-CM | POA: Diagnosis not present

## 2015-09-18 DIAGNOSIS — I4891 Unspecified atrial fibrillation: Secondary | ICD-10-CM | POA: Diagnosis not present

## 2015-09-18 LAB — POCT INR: INR: 1.9

## 2015-09-19 MED FILL — dilTIAZem HCL 120 MG TABS: 120 | 30 days supply | Qty: 30 | Fill #1

## 2015-10-16 ENCOUNTER — Ambulatory Visit (INDEPENDENT_AMBULATORY_CARE_PROVIDER_SITE_OTHER): Payer: Medicare Other | Admitting: Pharmacist

## 2015-10-16 DIAGNOSIS — I480 Paroxysmal atrial fibrillation: Secondary | ICD-10-CM | POA: Diagnosis not present

## 2015-10-16 DIAGNOSIS — I4891 Unspecified atrial fibrillation: Secondary | ICD-10-CM

## 2015-10-16 DIAGNOSIS — Z5181 Encounter for therapeutic drug level monitoring: Secondary | ICD-10-CM | POA: Diagnosis not present

## 2015-10-16 LAB — POCT INR: INR: 2.4

## 2015-10-19 MED FILL — dilTIAZem HCL 120 MG TABS: 120 | 30 days supply | Qty: 30 | Fill #2

## 2015-11-13 ENCOUNTER — Ambulatory Visit (INDEPENDENT_AMBULATORY_CARE_PROVIDER_SITE_OTHER): Payer: Medicare Other | Admitting: *Deleted

## 2015-11-13 DIAGNOSIS — I4891 Unspecified atrial fibrillation: Secondary | ICD-10-CM

## 2015-11-13 DIAGNOSIS — I480 Paroxysmal atrial fibrillation: Secondary | ICD-10-CM

## 2015-11-13 DIAGNOSIS — Z5181 Encounter for therapeutic drug level monitoring: Secondary | ICD-10-CM | POA: Diagnosis not present

## 2015-11-13 LAB — POCT INR: INR: 2.2

## 2015-11-20 MED FILL — dilTIAZem HCL 120 MG TABS: 120 | 30 days supply | Qty: 30 | Fill #3

## 2015-11-29 DIAGNOSIS — H43392 Other vitreous opacities, left eye: Secondary | ICD-10-CM | POA: Diagnosis not present

## 2015-11-29 DIAGNOSIS — H16223 Keratoconjunctivitis sicca, not specified as Sjogren's, bilateral: Secondary | ICD-10-CM | POA: Diagnosis not present

## 2015-11-29 DIAGNOSIS — H5203 Hypermetropia, bilateral: Secondary | ICD-10-CM | POA: Diagnosis not present

## 2015-11-29 DIAGNOSIS — Z961 Presence of intraocular lens: Secondary | ICD-10-CM | POA: Diagnosis not present

## 2015-11-29 DIAGNOSIS — H353132 Nonexudative age-related macular degeneration, bilateral, intermediate dry stage: Secondary | ICD-10-CM | POA: Diagnosis not present

## 2015-11-29 DIAGNOSIS — H35363 Drusen (degenerative) of macula, bilateral: Secondary | ICD-10-CM | POA: Diagnosis not present

## 2015-12-11 ENCOUNTER — Ambulatory Visit (INDEPENDENT_AMBULATORY_CARE_PROVIDER_SITE_OTHER): Payer: Medicare Other | Admitting: Pharmacist

## 2015-12-11 DIAGNOSIS — Z5181 Encounter for therapeutic drug level monitoring: Secondary | ICD-10-CM | POA: Diagnosis not present

## 2015-12-11 DIAGNOSIS — I4891 Unspecified atrial fibrillation: Secondary | ICD-10-CM

## 2015-12-11 DIAGNOSIS — I480 Paroxysmal atrial fibrillation: Secondary | ICD-10-CM

## 2015-12-11 LAB — POCT INR: INR: 3

## 2015-12-19 MED FILL — dilTIAZem HCL 120 MG TABS: 120 | 30 days supply | Qty: 30 | Fill #4

## 2016-01-15 DIAGNOSIS — N3941 Urge incontinence: Secondary | ICD-10-CM | POA: Diagnosis not present

## 2016-01-15 DIAGNOSIS — N390 Urinary tract infection, site not specified: Secondary | ICD-10-CM | POA: Diagnosis not present

## 2016-01-18 MED FILL — CEFDINIR 300 MG CAPSULE: 300 | 7 days supply | Qty: 14 | Fill #0

## 2016-01-22 ENCOUNTER — Ambulatory Visit (INDEPENDENT_AMBULATORY_CARE_PROVIDER_SITE_OTHER): Payer: Medicare Other | Admitting: Pharmacist

## 2016-01-22 DIAGNOSIS — I480 Paroxysmal atrial fibrillation: Secondary | ICD-10-CM | POA: Diagnosis not present

## 2016-01-22 DIAGNOSIS — Z5181 Encounter for therapeutic drug level monitoring: Secondary | ICD-10-CM | POA: Diagnosis not present

## 2016-01-22 DIAGNOSIS — I4891 Unspecified atrial fibrillation: Secondary | ICD-10-CM

## 2016-01-22 LAB — POCT INR: INR: 2.2

## 2016-01-22 MED FILL — WARFARIN SODIUM 5 MG TABLET: 5 | 30 days supply | Qty: 30 | Fill #2

## 2016-01-22 MED FILL — dilTIAZem HCL 120 MG TABS: 120 | 30 days supply | Qty: 30 | Fill #5

## 2016-02-21 MED FILL — dilTIAZem HCL 120 MG TABS: 120 | 30 days supply | Qty: 30 | Fill #6

## 2016-03-04 ENCOUNTER — Ambulatory Visit (INDEPENDENT_AMBULATORY_CARE_PROVIDER_SITE_OTHER): Payer: Medicare Other | Admitting: *Deleted

## 2016-03-04 DIAGNOSIS — I480 Paroxysmal atrial fibrillation: Secondary | ICD-10-CM

## 2016-03-04 DIAGNOSIS — I4891 Unspecified atrial fibrillation: Secondary | ICD-10-CM | POA: Diagnosis not present

## 2016-03-04 DIAGNOSIS — Z5181 Encounter for therapeutic drug level monitoring: Secondary | ICD-10-CM | POA: Diagnosis not present

## 2016-03-04 LAB — POCT INR: INR: 2.2

## 2016-03-04 MED FILL — WARFARIN SODIUM 5 MG TABLET: 5 | 30 days supply | Qty: 30 | Fill #3

## 2016-03-06 ENCOUNTER — Ambulatory Visit (HOSPITAL_BASED_OUTPATIENT_CLINIC_OR_DEPARTMENT_OTHER)
Admission: RE | Admit: 2016-03-06 | Discharge: 2016-03-06 | Disposition: A | Discharge status: Home / Self Care | Payer: Medicare Other | Source: Ambulatory Visit | Attending: Family | Admitting: Family

## 2016-03-06 ENCOUNTER — Ambulatory Visit (INDEPENDENT_AMBULATORY_CARE_PROVIDER_SITE_OTHER): Payer: Medicare Other | Admitting: Family

## 2016-03-06 ENCOUNTER — Encounter: Payer: Self-pay | Admitting: Family

## 2016-03-06 VITALS — BP 100/47 | HR 51 | Temp 97.9°F | Resp 16 | Ht 65.5 in | Wt 166.4 lb

## 2016-03-06 DIAGNOSIS — M810 Age-related osteoporosis without current pathological fracture: Secondary | ICD-10-CM | POA: Diagnosis not present

## 2016-03-06 DIAGNOSIS — H3530 Unspecified macular degeneration: Secondary | ICD-10-CM | POA: Diagnosis not present

## 2016-03-06 DIAGNOSIS — I10 Essential (primary) hypertension: Secondary | ICD-10-CM | POA: Diagnosis not present

## 2016-03-06 DIAGNOSIS — E2839 Other primary ovarian failure: Secondary | ICD-10-CM | POA: Diagnosis not present

## 2016-03-06 DIAGNOSIS — I482 Chronic atrial fibrillation, unspecified: Secondary | ICD-10-CM

## 2016-03-06 DIAGNOSIS — Z13820 Encounter for screening for osteoporosis: Secondary | ICD-10-CM | POA: Insufficient documentation

## 2016-03-06 DIAGNOSIS — E559 Vitamin D deficiency, unspecified: Secondary | ICD-10-CM

## 2016-03-06 DIAGNOSIS — Z0000 Encounter for general adult medical examination without abnormal findings: Secondary | ICD-10-CM

## 2016-03-06 DIAGNOSIS — Z23 Encounter for immunization: Secondary | ICD-10-CM | POA: Diagnosis not present

## 2016-03-06 DIAGNOSIS — Z780 Asymptomatic menopausal state: Secondary | ICD-10-CM | POA: Insufficient documentation

## 2016-03-06 LAB — BASIC METABOLIC PANEL
BUN: 14 mg/dL (ref 6–23)
CHLORIDE: 106 meq/L (ref 96–112)
CO2: 29 mEq/L (ref 19–32)
Calcium: 9.3 mg/dL (ref 8.4–10.5)
Creatinine, Ser: 0.63 mg/dL (ref 0.40–1.20)
GFR: 96.25 mL/min (ref >60.00)
GLUCOSE: 98 mg/dL (ref 70–99)
POTASSIUM: 4 meq/L (ref 3.5–5.1)
SODIUM: 143 meq/L (ref 135–145)

## 2016-03-06 LAB — VITAMIN D 25 HYDROXY (VIT D DEFICIENCY, FRACTURES): VITD: 31.9 ng/mL (ref 30.00–100.00)

## 2016-03-06 NOTE — Progress Notes (Signed)
Subjective:    Carla Curtis is a 82 y.o. female who presents for Medicare Annual/Subsequent preventive examination.  Preventive Screening-Counseling & Management  Tobacco History  Smoking Status  . Passive Smoke Exposure - Never Smoker  Smokeless Tobacco  . Never Used    Comment: Lived with smokers      Problems Prior to Visit   Immunizations: due for flu shot Diet: reports healthy diet Exercise: walks. Reports some sob and balance issues- requests handicapped placard Colonoscopy: 2013 Dexa:2013 Pap Smear: hysterectomy 2013- declines further mammograms  HTN- maintained on diltiazem.   BP Readings from Last 3 Encounters:  03/06/16 (!) 100/47  08/02/15 101/66  07/17/15 (!) 122/52   CHF- does have some sob with walking. This is not new. ar  AF- on coumadin and followed by the coumadin clinic. Followed by Dr. Stanford Breed.   Vit D deficiency- she continues vit D supplement.  Urinary incontinence- follows with Dr. Estill Dooms.   Macular degeneration- Sees Dr. Phineas Real at [facility]. She is on a vitamin supplement but cannot remember name. Follows every 6 months.   Current Problems (verified) Patient Active Problem List   Diagnosis Date Noted  . Right ankle injury 12/13/2014  . Chest pain 02/21/2014  . Dyspnea on exertion 02/21/2014  . Atypical chest pain 01/31/2014  . Dizziness 10/07/2013  . Vision problem 10/07/2013  . Fasting hyperglycemia 07/20/2013  . Encounter for therapeutic drug monitoring 07/05/2013  . Grief reaction 06/16/2012  . Vitamin D deficiency 12/13/2011  . Skin lesion 10/08/2010  . Cystocele 10/03/2010  . Mitral valve prolapse 10/03/2010  . CHF (congestive heart failure) ([place]) 10/03/2010  . Long term current use of anticoagulant 07/11/2010  . POLYP, COLON 02/19/2010  . OSTEOPENIA 11/13/2009  . Essential hypertension 10/10/2009  . FIBRILLATION, ATRIAL 10/10/2009  . INCONTINENCE 10/10/2009  . CHEST PAIN, HX OF 10/10/2009    Medications  Prior to Visit Current Outpatient Prescriptions on File Prior to Visit  Medication Sig Dispense Refill  . Cyanocobalamin (VITAMIN B 12 PO) Take 1 lozenge by mouth daily. 1076mcg daily    . diltiazem (CARDIZEM) 120 MG tablet TAKE 1 TABLET (120 MG TOTAL) BY MOUTH DAILY. PATIENT NEEDS TO CONTACT OFFICE FOR ADDITIONAL REFILLS 30 tablet 11  . Krill Oil Omega-3 500 MG CAPS Take 1 capsule by mouth daily.    Marland Kitchen warfarin (COUMADIN) 5 MG tablet Take 1 tablet by mouth daily or as directed by coumadin clinic 30 tablet 3   No current facility-administered medications on file prior to visit.     Current Medications (verified) Current Outpatient Prescriptions  Medication Sig Dispense Refill  . Cyanocobalamin (VITAMIN B 12 PO) Take 1 lozenge by mouth daily. 1081mcg daily    . diltiazem (CARDIZEM) 120 MG tablet TAKE 1 TABLET (120 MG TOTAL) BY MOUTH DAILY. PATIENT NEEDS TO CONTACT OFFICE FOR ADDITIONAL REFILLS 30 tablet 11  . Krill Oil Omega-3 500 MG CAPS Take 1 capsule by mouth daily.    . Vitamin D-Vitamin K (D3 + K2 DOTS PO) Take 1 tablet by mouth daily.    Marland Kitchen warfarin (COUMADIN) 5 MG tablet Take 1 tablet by mouth daily or as directed by coumadin clinic 30 tablet 3   No current facility-administered medications for this visit.      Allergies (verified) Review of patient's allergies indicates no known allergies.   PAST HISTORY  Family History Family History  Problem Relation Age of Onset  . Hypertension Mother   . COPD Father     smoker  .  Cancer Father     lung  . Cancer Sister     breast  . Bell's palsy Sister   . Heart disease Sister     CHF  . Cancer Daughter     breast/ stage 1- s/p lumpectomy and radiation  . Cancer Other     Hodgkins Disease  . Cancer Daughter     Social History Social History  Substance Use Topics  . Smoking status: Passive Smoke Exposure - Never Smoker  . Smokeless tobacco: Never Used     Comment: Lived with smokers   . Alcohol use 0.0 oz/week      Comment: rare- 1 drink/ month     Are there smokers in your home (other than you)? No  Risk Factors Current exercise habits: walking Dietary issues discussed: continue healthy diety   Cardiac risk factors: advanced age (older than 39 for men, 24 for women) and hypertension.  Depression Screen (Note: if answer to either of the following is "Yes", a more complete depression screening is indicated)   Over the past two weeks, have you felt down, depressed or hopeless? No  Over the past two weeks, have you felt little interest or pleasure in doing things? No  Have you lost interest or pleasure in daily life? No  Do you often feel hopeless? No  Do you cry easily over simple problems? No  Activities of Daily Living In your present state of health, do you have any difficulty performing the following activities?:  Driving? No Managing money?  No Feeding yourself? No Getting from bed to chair? No . Climbing a flight of stairs? No Preparing food and eating?: No Bathing or showering? No Getting dressed: No Getting to the toilet? No Using the toilet:No Moving around from place to place: No In the past year have you fallen or had a near fall?:Yes tripped on a curb   Are you sexually active?  No  Do you have more than one partner?  No  Hearing Difficulties: No Do you often ask people to speak up or repeat themselves? No Do you experience ringing or noises in your ears? No Do you have difficulty understanding soft or whispered voices? No   Do you feel that you have a problem with memory? No  Do you often misplace items? No  Do you feel safe at home?  Yes  Cognitive Testing  Alert? Yes  Normal Appearance?Yes  Oriented to person? Yes  Place? Yes   Time? Yes  Recall of three objects?  Yes  Can perform simple calculations? Yes  Displays appropriate judgment?Yes  Can read the correct time from a watch face?Yes   Advanced Directives have been discussed with the patient? Yes  List  the Names of Other Physician/Practitioners you currently use: 1.    Indicate any recent Medical Services you may have received from other than [facility] providers in the past year (date may be approximate).  Immunization History  Administered Date(s) Administered  . Hep A / Hep B 11/19/2013, 11/26/2013, 12/08/2013  . Influenza Split 05/10/2012  . Influenza, High Dose Seasonal PF 06/23/2013  . Influenza,inj,Quad PF,36+ Mos 01/31/2014  . Pneumococcal Conjugate-13 07/20/2013  . Pneumococcal Polysaccharide-23 06/12/2009  . Td 02/06/2007  . Zoster 06/19/2009    Screening Tests Health Maintenance  Topic Date Due  . URINE MICROALBUMIN  08/02/1942  . HEMOGLOBIN A1C  01/17/2014  . FOOT EXAM  01/10/2016  . INFLUENZA VACCINE  07/06/2016 (Originally 01/09/2016)  . OPHTHALMOLOGY EXAM  11/08/2016  .  TETANUS/TDAP  02/05/2017  . DEXA SCAN  Completed  . ZOSTAVAX  Completed  . PNA vac Low Risk Adult  Completed    All answers were reviewed with the patient and necessary referrals were made:  O'SULLIVAN,Arley Salamone S., NP   03/06/2016   History reviewed: allergies, current medications, past family history, past medical history, past social history, past surgical history and problem list  Review of Systems Pertinent items are noted in HPI.    Objective:    Body mass index is 27.27 kg/m. BP (!) 100/47 (BP Location: Right Arm, Cuff Size: Normal)   Pulse (!) 51   Temp 97.9 F (36.6 C) (Oral)   Resp 16   Ht 5' 5.5" (1.664 m)   Wt 166 lb 6.4 oz (75.5 kg)   SpO2 96% Comment: room air  BMI 27.27 kg/m   Physical Exam  Constitutional: She is oriented to person, place, and time. She appears well-developed and well-nourished. No distress.  HENT:  Head: Normocephalic and atraumatic.  Right Ear: Tympanic membrane and ear canal normal.  Left Ear: Tympanic membrane and ear canal normal.  Mouth/Throat: Oropharynx is clear and moist.  Eyes: Pupils are equal, round, and reactive to light. No scleral  icterus.  Neck: Normal range of motion. No thyromegaly present.  Cardiovascular: Normal rate and regular rhythm.   No murmur heard. Pulmonary/Chest: Effort normal and breath sounds normal. No respiratory distress. He has no wheezes. She has no rales. She exhibits no tenderness.  Abdominal: Soft. Bowel sounds are normal. She exhibits no distension and no mass. There is no tenderness. There is no rebound and no guarding.  Musculoskeletal: She exhibits no edema.  Lymphadenopathy:    She has no cervical adenopathy.  Neurological: She is alert and oriented to person, place, and time. She has normal patellar reflexes. She exhibits normal muscle tone. Coordination normal.  Skin: Skin is warm and dry.  Psychiatric: She has a normal mood and affect. Her behavior is normal. Judgment and thought content normal.  Breasts: Examined lying Right: Without masses, retractions, discharge or axillary adenopathy. (bilateral breast exam is limited by calcified breast implants Left: Without masses, retractions, discharge or axillary adenopathy.  Pelvic: deferred      Assessment & Plan:   Vit D deficiency- will check vit D level  AF- continue coumadin (per coumadin clinic), diltiazem. Will arrange follow up with cardiology.   Urinary incontinence- followed by urology, stable.   HTN- stable on diltiazem, continue same.  Macular degeneration- following with opthalmology.      Assessment:          Plan:     During the course of the visit the patient was educated and counseled about appropriate screening and preventive services including:    Advanced directives: has an advanced directive - a copy HAS NOT been provided., copy requested.   Diet review for nutrition referral? Yes ____  Not Indicated _x___   Patient Instructions (the written plan) was given to the patient.  Medicare Attestation I have personally reviewed: The patient's medical and social history Their use of alcohol, tobacco  or illicit drugs Their current medications and supplements The patient's functional ability including ADLs,fall risks, home safety risks, cognitive, and hearing and visual impairment Diet and physical activities Evidence for depression or mood disorders  The patient's weight, height, BMI, and visual acuity have been recorded in the chart.  I have made referrals, counseling, and provided education to the patient based on review of the above and I  have provided the patient with a written personalized care plan for preventive services.     O'SULLIVAN,Sahil Milner S., NP   03/06/2016

## 2016-03-06 NOTE — Progress Notes (Signed)
Pre visit review using our clinic review tool, if applicable. No additional management support is needed unless otherwise documented below in the visit note. 

## 2016-03-06 NOTE — Patient Instructions (Addendum)
Please complete lab work prior to leaving. Bring [place] a copy of your [place] when you can.  You will be contacted about your referral to Dr. Stanford Breed and your bone density.

## 2016-03-07 ENCOUNTER — Emergency Department (HOSPITAL_BASED_OUTPATIENT_CLINIC_OR_DEPARTMENT_OTHER): Payer: Medicare Other

## 2016-03-07 ENCOUNTER — Emergency Department (HOSPITAL_BASED_OUTPATIENT_CLINIC_OR_DEPARTMENT_OTHER)
Admission: EM | Admit: 2016-03-07 | Discharge: 2016-03-07 | Disposition: A | Discharge status: Home / Self Care | Payer: Medicare Other | Attending: Emergency Medicine | Admitting: Emergency Medicine

## 2016-03-07 ENCOUNTER — Telehealth: Payer: Self-pay | Admitting: Family

## 2016-03-07 ENCOUNTER — Encounter (HOSPITAL_BASED_OUTPATIENT_CLINIC_OR_DEPARTMENT_OTHER): Payer: Self-pay | Admitting: Emergency Medicine

## 2016-03-07 DIAGNOSIS — R0602 Shortness of breath: Secondary | ICD-10-CM | POA: Diagnosis not present

## 2016-03-07 DIAGNOSIS — I110 Hypertensive heart disease with heart failure: Secondary | ICD-10-CM | POA: Insufficient documentation

## 2016-03-07 DIAGNOSIS — M6281 Muscle weakness (generalized): Secondary | ICD-10-CM | POA: Diagnosis not present

## 2016-03-07 DIAGNOSIS — Z7722 Contact with and (suspected) exposure to environmental tobacco smoke (acute) (chronic): Secondary | ICD-10-CM | POA: Insufficient documentation

## 2016-03-07 DIAGNOSIS — I509 Heart failure, unspecified: Secondary | ICD-10-CM | POA: Diagnosis not present

## 2016-03-07 DIAGNOSIS — Z7901 Long term (current) use of anticoagulants: Secondary | ICD-10-CM | POA: Diagnosis not present

## 2016-03-07 DIAGNOSIS — Z79899 Other long term (current) drug therapy: Secondary | ICD-10-CM | POA: Insufficient documentation

## 2016-03-07 DIAGNOSIS — R531 Weakness: Secondary | ICD-10-CM

## 2016-03-07 DIAGNOSIS — R42 Dizziness and giddiness: Secondary | ICD-10-CM | POA: Diagnosis not present

## 2016-03-07 LAB — CBC
HEMATOCRIT: 38.1 % (ref 36.0–46.0)
HEMOGLOBIN: 12.8 g/dL (ref 12.0–15.0)
MCH: 31 pg (ref 26.0–34.0)
MCHC: 33.6 g/dL (ref 30.0–36.0)
MCV: 92.3 fL (ref 78.0–100.0)
PLATELETS: 187 10*3/uL (ref 150–400)
RBC: 4.13 MIL/uL (ref 3.87–5.11)
RDW: 14.9 % (ref 11.5–15.5)
WBC: 5.7 10*3/uL (ref 4.0–10.5)

## 2016-03-07 LAB — URINALYSIS, ROUTINE W REFLEX MICROSCOPIC
BILIRUBIN URINE: NEGATIVE
Glucose, UA: NEGATIVE mg/dL
Hgb urine dipstick: NEGATIVE
KETONES UR: NEGATIVE mg/dL
NITRITE: NEGATIVE
PH: 5.5 (ref 5.0–8.0)
Protein, ur: NEGATIVE mg/dL
SPECIFIC GRAVITY, URINE: 1.021 (ref 1.005–1.030)

## 2016-03-07 LAB — DIFFERENTIAL
BASOS PCT: 0 %
Basophils Absolute: 0 10*3/uL (ref 0.0–0.1)
EOS ABS: 0.1 10*3/uL (ref 0.0–0.7)
EOS PCT: 1 %
LYMPHS PCT: 31 %
Lymphs Abs: 1.8 10*3/uL (ref 0.7–4.0)
MONO ABS: 0.7 10*3/uL (ref 0.1–1.0)
Monocytes Relative: 12 %
NEUTROS PCT: 56 %
Neutro Abs: 3.1 10*3/uL (ref 1.7–7.7)

## 2016-03-07 LAB — URINE MICROSCOPIC-ADD ON

## 2016-03-07 LAB — COMPREHENSIVE METABOLIC PANEL
ALBUMIN: 3.9 g/dL (ref 3.5–5.0)
ALK PHOS: 76 U/L (ref 38–126)
ALT: 9 U/L — ABNORMAL LOW (ref 14–54)
ANION GAP: 6 (ref 5–15)
AST: 22 U/L (ref 15–41)
BUN: 21 mg/dL — ABNORMAL HIGH (ref 6–20)
CALCIUM: 9.7 mg/dL (ref 8.9–10.3)
CO2: 26 mmol/L (ref 22–32)
Chloride: 108 mmol/L (ref 101–111)
Creatinine, Ser: 0.98 mg/dL (ref 0.44–1.00)
GFR calc non Af Amer: 53 mL/min — ABNORMAL LOW (ref >60)
GLUCOSE: 130 mg/dL — AB (ref 65–99)
POTASSIUM: 3.8 mmol/L (ref 3.5–5.1)
SODIUM: 140 mmol/L (ref 135–145)
Total Bilirubin: 0.6 mg/dL (ref 0.3–1.2)
Total Protein: 6.6 g/dL (ref 6.5–8.1)

## 2016-03-07 LAB — TROPONIN I: Troponin I: <0.03 ng/mL (ref <0.03)

## 2016-03-07 LAB — PROTIME-INR
INR: 2.05
PROTHROMBIN TIME: 23.4 s — AB (ref 11.4–15.2)

## 2016-03-07 LAB — APTT: APTT: 40 s — AB (ref 24–36)

## 2016-03-07 IMAGING — US US CAROTID DUPLEX BILAT
1 series · 13 of 24 positions shown · non-contrast
Comparison: None.

CLINICAL DATA: Intermittent vision loss.

EXAM:
BILATERAL CAROTID DUPLEX ULTRASOUND
TECHNIQUE: Gray scale imaging, color Doppler and duplex ultrasound were
performed of bilateral carotid and vertebral arteries in the neck.

[Series 1: us carotid duplex bilat · 0.08mm/px · 13 of 58 slices shown]
[im 1/58]
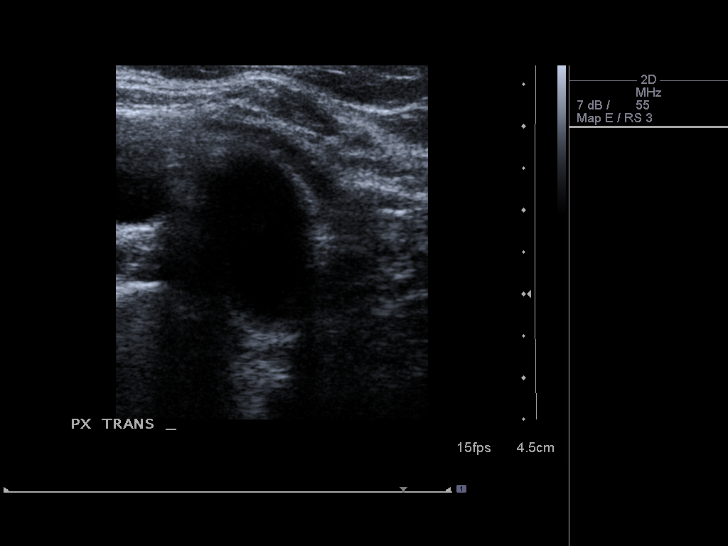
[im 5/58]
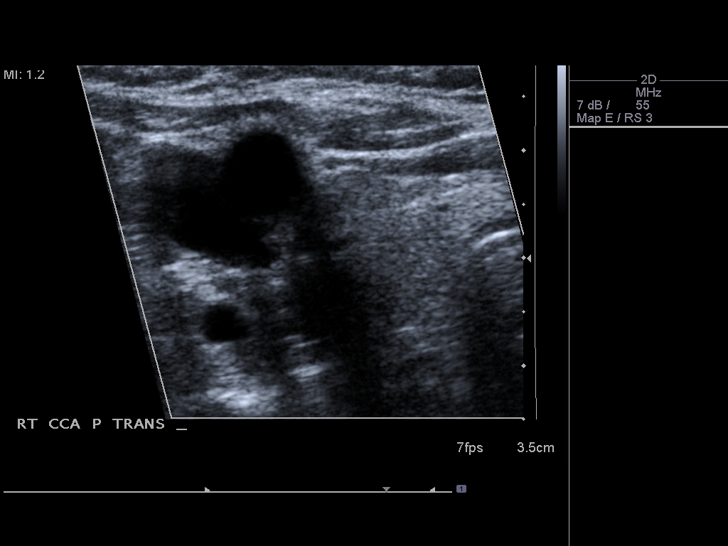
[im 10/58]
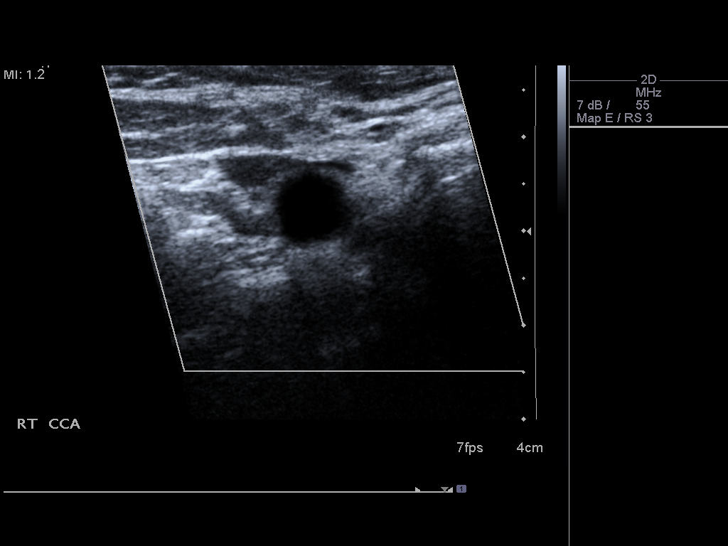
[im 15/58]
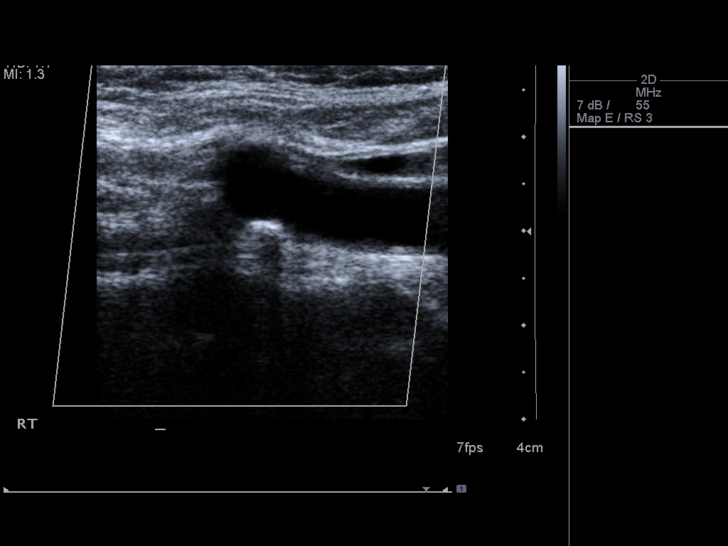
[im 20/58]
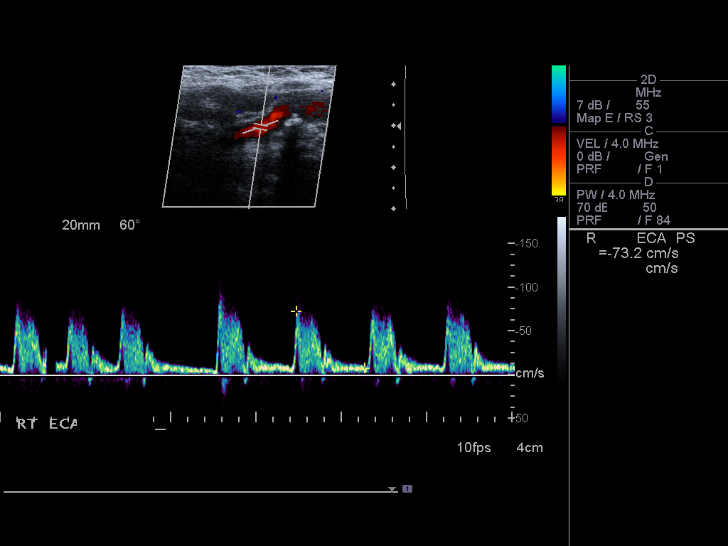
[im 25/58]
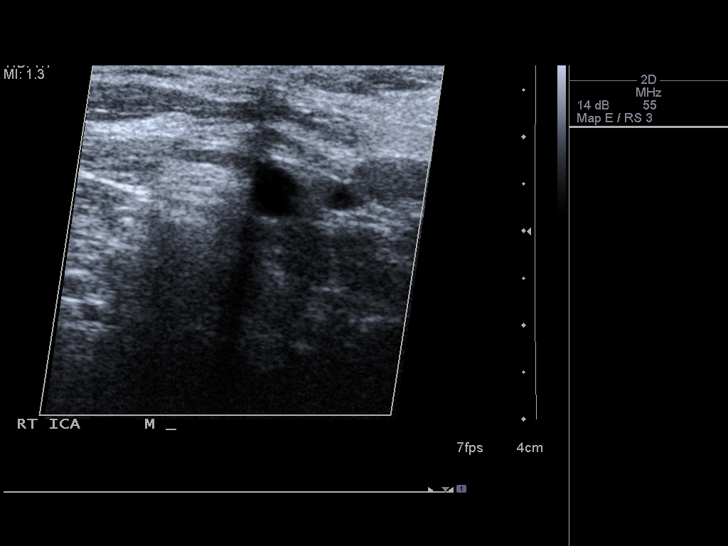
[im 30/58]
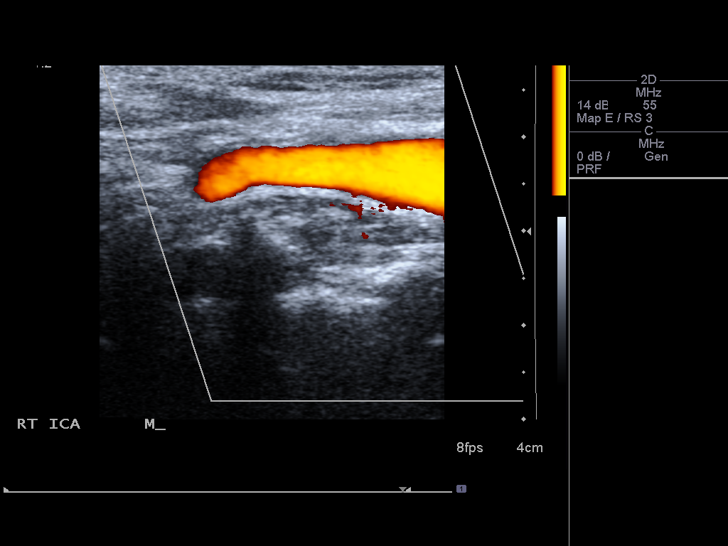
[im 33/58]
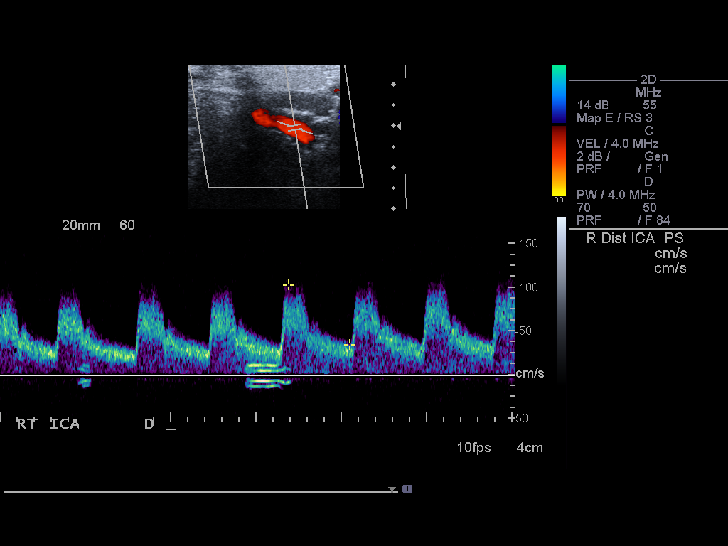
[im 38/58]
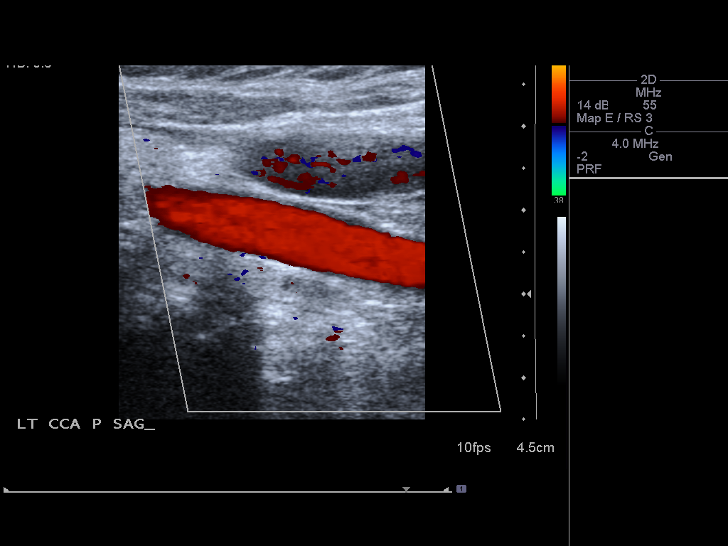
[im 43/58]
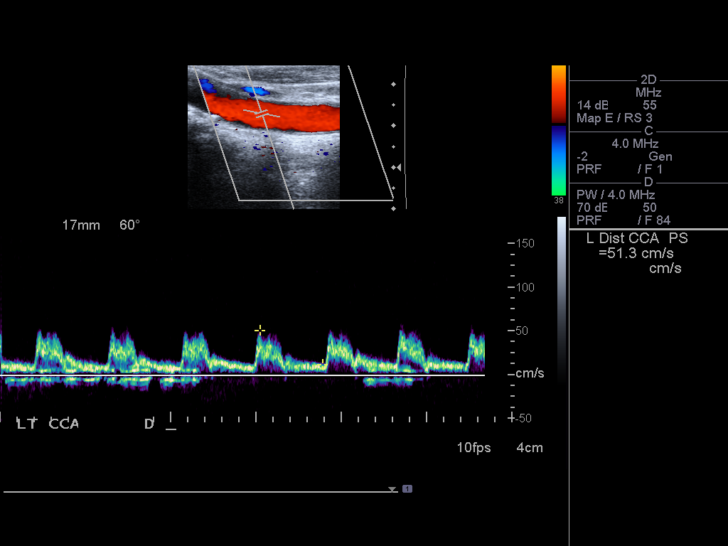
[im 48/58]
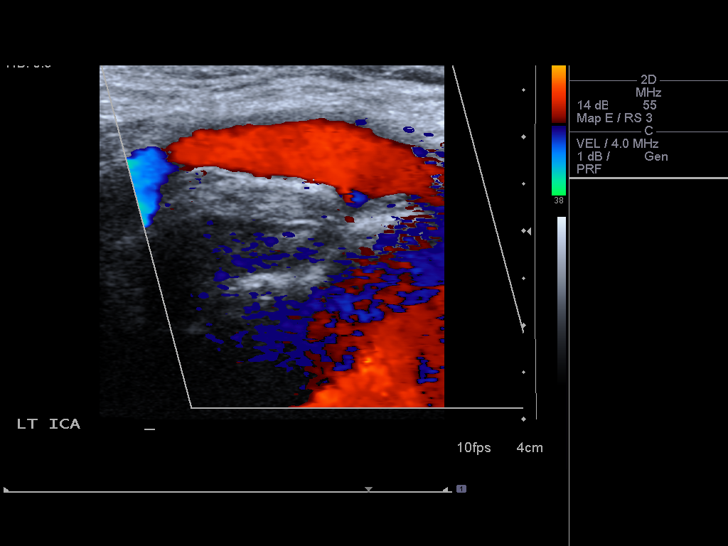
[im 53/58]
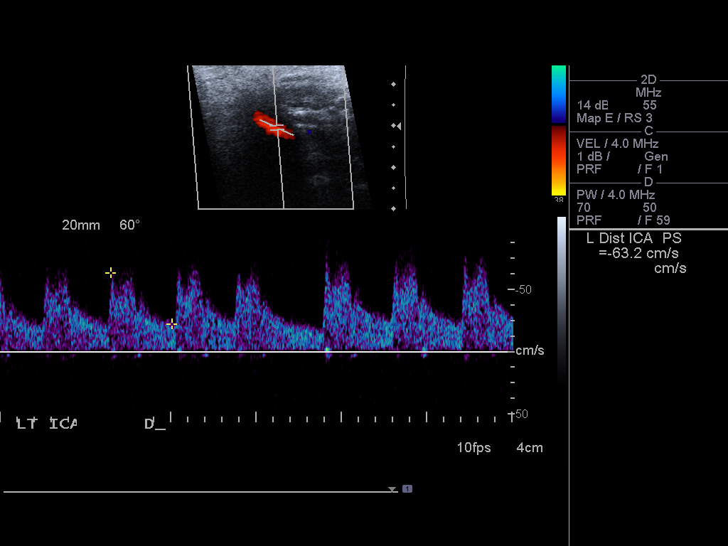
[im 58/58]
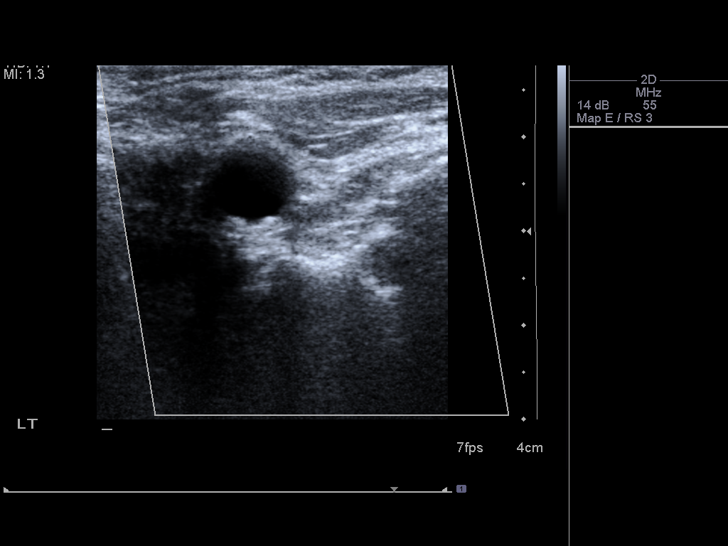

[13 of 24 positions shown; findings below may reference images not displayed]

FINDINGS: Criteria: Quantification of carotid stenosis is based on velocity
parameters that correlate the residual internal carotid diameter
with NASCET-based stenosis levels, using the diameter of the distal
internal carotid lumen as the denominator for stenosis measurement.

The following velocity measurements were obtained:

RIGHT

ICA:  103/35 cm/sec

CCA:  99/16 cm/sec

SYSTOLIC ICA/CCA RATIO:

DIASTOLIC ICA/CCA RATIO:

ECA:  73 cm/sec

LEFT

ICA:  82/20 cm/sec

CCA:  77/10 cm/sec

SYSTOLIC ICA/CCA RATIO:

DIASTOLIC ICA/CCA RATIO:  2

ECA:  102 cm/sec

RIGHT CAROTID ARTERY: Calcified atherosclerotic plaque noted at the
bifurcation. No luminal narrowing of significance.

RIGHT VERTEBRAL ARTERY:  Antegrade flow. Normal waveform.

LEFT CAROTID ARTERY: Mild calcified atherosclerotic plaque noted at
the bifurcation. No luminal narrowing of significance.

LEFT VERTEBRAL ARTERY:  Antegrade flow.  Normal waveform.
IMPRESSION: 1. Mild calcified atherosclerosis at the carotid bifurcations. No
carotid stenosis.
2. Antegrade bilateral vertebral arteries.

## 2016-03-07 MED ORDER — ALENDRONATE SODIUM 70 MG PO TABS: 70.0000 mg | ORAL_TABLET | ORAL | 11 refills | Status: DC

## 2016-03-07 MED ORDER — SODIUM CHLORIDE 0.9 % IV BOLUS (SEPSIS)
1000.0000 mL | Freq: Once | INTRAVENOUS | Status: AC
  Administered 2016-03-07: 1000 mL via INTRAVENOUS

## 2016-03-07 MED FILL — ALENDRONATE NA 70 MG TAB: 70 | 28 days supply | Qty: 4 | Fill #0

## 2016-03-07 NOTE — Discharge Instructions (Signed)
Drink plenty of fluids.  Return for sudden worsening, chest pain, passing out.

## 2016-03-07 NOTE — ED Notes (Signed)
Patient transported to CT 

## 2016-03-07 NOTE — ED Triage Notes (Signed)
Patient reports that she woke up at 7 am and is having some lightheadedness - Patient states that as the day has progressed she has had increased SOB and worsening of symptoms. The patient had a wellness check yesterday and was seen with low blood pressure

## 2016-03-07 NOTE — Telephone Encounter (Signed)
Bone density shows osteoporosis in her spine.  I would recommend that she begin fosamax once weekly, take on empty stomach in AM, do not lay down for 90 minutes after taking.

## 2016-03-07 NOTE — ED Provider Notes (Addendum)
[facility] DEPT [facility] Provider Note   CSN: [id number] Arrival date & time: 03/07/16  1522     History   Chief Complaint Chief Complaint  Patient presents with  . Weakness    HPI Carla Curtis is a 82 y.o. female.  82 yo F with a chief complaint weakness. This been going on since this morning. Patient had a yearly physical done yesterday and her diastolic was low. She is concerned because the last time this happened she passed out. Today she has felt a bit lightheaded and unsteady usually upon standing her going up a flight of stairs. She has had some shortness of breath with this. Denies chest pain denies actual syncope. Denies worsening with head motion. Symptoms usually resolve with rest.   The history is provided by the patient and a relative.  Weakness  Primary symptoms include no dizziness. Pertinent negatives include no shortness of breath, no chest pain, no vomiting and no headaches.  Loss of Consciousness   This is a new problem. The current episode started 3 to 5 hours ago. The problem occurs constantly. The problem has not changed since onset.There was no loss of consciousness. The problem is associated with normal activity, going up stairs and standing up. Associated symptoms include light-headedness and weakness. Pertinent negatives include chest pain, congestion, dizziness, fever, headaches, nausea, palpitations and vomiting. She has tried nothing for the symptoms. The treatment provided no relief.    Past Medical History:  Diagnosis Date  . Atrial fibrillation ([place])    on Coumadin since mid 2010  . Incontinence   . Macular degeneration   . Normal cardiac stress test 01/27/09   lvef 73% done at [facility] (nuclear stress)  . Squamous cell carcinoma ([place]) 10/14   left side of nose  . Vitamin D deficiency 12/13/2011    Patient Active Problem List   Diagnosis Date Noted  . Right ankle injury 12/13/2014  . Chest pain 02/21/2014  . Dyspnea on exertion 02/21/2014  .  Atypical chest pain 01/31/2014  . Dizziness 10/07/2013  . Vision problem 10/07/2013  . Fasting hyperglycemia 07/20/2013  . Encounter for therapeutic drug monitoring 07/05/2013  . Grief reaction 06/16/2012  . Vitamin D deficiency 12/13/2011  . Skin lesion 10/08/2010  . Cystocele 10/03/2010  . Mitral valve prolapse 10/03/2010  . CHF (congestive heart failure) ([place]) 10/03/2010  . Long term current use of anticoagulant 07/11/2010  . POLYP, COLON 02/19/2010  . Osteoporosis 11/13/2009  . Essential hypertension 10/10/2009  . FIBRILLATION, ATRIAL 10/10/2009  . INCONTINENCE 10/10/2009  . CHEST PAIN, HX OF 10/10/2009    Past Surgical History:  Procedure Laterality Date  . ABDOMINAL HYSTERECTOMY  2007  . ARM SKIN LESION BIOPSY / EXCISION     pre-cancerous  . Bilateral mammoplasty    . LAMINECTOMY  1989 and 1990   L4, L5  . TONSILLECTOMY AND ADENOIDECTOMY  1941    OB History    No data available       Home Medications    Prior to Admission medications   Medication Sig Start Date End Date Taking? Authorizing Provider  alendronate (FOSAMAX) 70 MG tablet Take 1 tablet (70 mg total) by mouth every 7 (seven) days. Take with a full glass of water on an empty stomach. 03/07/16   Debbrah Alar, NP  Cyanocobalamin (VITAMIN B 12 PO) Take 1 lozenge by mouth daily. 104mcg daily    Historical Provider, MD  diltiazem (CARDIZEM) 120 MG tablet TAKE 1 TABLET (120 MG TOTAL) BY  MOUTH DAILY. PATIENT NEEDS TO CONTACT OFFICE FOR ADDITIONAL REFILLS 08/18/15   Lelon Perla, MD  Astrid Drafts Omega-3 500 MG CAPS Take 1 capsule by mouth daily.    Historical Provider, MD  Vitamin D-Vitamin K (D3 + K2 DOTS PO) Take 1 tablet by mouth daily.    Historical Provider, MD  warfarin (COUMADIN) 5 MG tablet Take 1 tablet by mouth daily or as directed by coumadin clinic 05/09/15   Lelon Perla, MD    Family History Family History  Problem Relation Age of Onset  . Hypertension Mother   . COPD Father      smoker  . Cancer Father     lung  . Cancer Sister     breast  . Bell's palsy Sister   . Heart disease Sister     CHF  . Cancer Daughter     breast/ stage 1- s/p lumpectomy and radiation  . Cancer Other     Hodgkins Disease  . Cancer Daughter     Social History Social History  Substance Use Topics  . Smoking status: Passive Smoke Exposure - Never Smoker  . Smokeless tobacco: Never Used     Comment: Lived with smokers   . Alcohol use 0.0 oz/week     Comment: rare- 1 drink/ month     Allergies   Review of patient's allergies indicates no known allergies.   Review of Systems Review of Systems  Constitutional: Negative for chills and fever.  HENT: Negative for congestion and rhinorrhea.   Eyes: Negative for redness and visual disturbance.  Respiratory: Negative for shortness of breath and wheezing.   Cardiovascular: Positive for syncope. Negative for chest pain and palpitations.  Gastrointestinal: Negative for nausea and vomiting.  Genitourinary: Negative for dysuria and urgency.  Musculoskeletal: Negative for arthralgias and myalgias.  Skin: Negative for pallor and wound.  Neurological: Positive for weakness and light-headedness. Negative for dizziness and headaches.     Physical Exam Updated Vital Signs BP 151/73   Pulse 77   Temp 98.5 F (36.9 C) (Oral)   Resp 24   Ht 5\' 5"  (1.651 m)   Wt 166 lb (75.3 kg)   SpO2 94%   BMI 27.62 kg/m   Physical Exam  Constitutional: She is oriented to person, place, and time. She appears well-developed and well-nourished. No distress.  HENT:  Head: Normocephalic and atraumatic.  Eyes: EOM are normal. Pupils are equal, round, and reactive to light.  Neck: Normal range of motion. Neck supple.  Cardiovascular: Normal rate and regular rhythm.  Exam reveals no gallop and no friction rub.   No murmur heard. Pulmonary/Chest: Effort normal. She has no wheezes. She has no rales.  Abdominal: Soft. She exhibits no distension.  There is no tenderness.  Musculoskeletal: She exhibits no edema or tenderness.  Neurological: She is alert and oriented to person, place, and time.  Skin: Skin is warm and dry. She is not diaphoretic.  Psychiatric: She has a normal mood and affect. Her behavior is normal.  Nursing note and vitals reviewed.    ED Treatments / Results  Labs (all labs ordered are listed, but only abnormal results are displayed) Labs Reviewed  COMPREHENSIVE METABOLIC PANEL - Abnormal; Notable for the following:       Result Value   Glucose, Bld 130 (*)    BUN 21 (*)    ALT 9 (*)    GFR calc non Af Amer 53 (*)    All other components within  normal limits  URINALYSIS, ROUTINE W REFLEX MICROSCOPIC (NOT AT [facility]) - Abnormal; Notable for the following:    APPearance CLOUDY (*)    Leukocytes, UA LARGE (*)    All other components within normal limits  APTT - Abnormal; Notable for the following:    aPTT 40 (*)    All other components within normal limits  PROTIME-INR - Abnormal; Notable for the following:    Prothrombin Time 23.4 (*)    All other components within normal limits  URINE MICROSCOPIC-ADD ON - Abnormal; Notable for the following:    Squamous Epithelial / LPF 0-5 (*)    Bacteria, UA FEW (*)    Casts HYALINE CASTS (*)    Crystals CA OXALATE CRYSTALS (*)    All other components within normal limits  URINE CULTURE  CBC  DIFFERENTIAL  TROPONIN I    EKG  EKG Interpretation  Date/Time:  Thursday March 07 2016 15:41:09 EDT Ventricular Rate:  68 PR Interval:  166 QRS Duration: 88 QT Interval:  416 QTC Calculation: 442 R Axis:   -5 Text Interpretation:  Normal sinus rhythm Inferior infarct , age undetermined Anterolateral infarct , age undetermined Abnormal ECG No significant change since last tracing Confirmed by Michaelene Dutan MD, DANIEL [phone]) on 03/07/2016 4:00:46 PM       Radiology Dg Chest 2 View  Result Date: 03/07/2016 CLINICAL DATA:  82 year old female with dizziness, 82 year old  female with dizziness and hypotension EXAM: CHEST  2 VIEW COMPARISON:  Prior chest x-ray 07/02/2015 FINDINGS: Stable borderline cardiomegaly. Mediastinal contours remain unchanged. Atherosclerotic calcifications again noted in the thoracic aorta. Mild bronchitic change in interstitial prominence similar compared to prior. No focal airspace consolidation, pulmonary edema, pleural effusion or pneumothorax. No suspicious nodule or mass. Bilateral calcified breast augmentation prostheses. IMPRESSION: Stable chest x-ray without evidence of acute cardiopulmonary process. Aortic Atherosclerosis (ICD10-170.0) Electronically Signed   By: Jacqulynn Cadet M.D.   On: 03/07/2016 16:42   Ct Head Wo Contrast  Result Date: 03/07/2016 CLINICAL DATA:  Dizziness with lightheadedness and hypotension. Near syncope. EXAM: CT HEAD WITHOUT CONTRAST TECHNIQUE: Contiguous axial images were obtained from the base of the skull through the vertex without intravenous contrast. COMPARISON:  09/17/2013 FINDINGS: Brain: The ventricles, cisterns and other CSF spaces are within normal. Minimal chronic ischemic microvascular disease. There is no mass, mass effect, shift of midline structures or acute hemorrhage. There is no evidence of acute infarction. Vascular: Calcified plaque over the cavernous segment of the internal carotid arteries. Skull: Within normal. Sinuses/Orbits: Hypoplastic frontal sinuses as the sinuses are otherwise clear. Orbits within normal. IMPRESSION: No acute intracranial findings. Minimal chronic ischemic microvascular disease. Electronically Signed   By: Marin Olp M.D.   On: 03/07/2016 16:43   Dg Bone Density  Result Date: 03/06/2016 EXAM: DUAL X-RAY ABSORPTIOMETRY (DXA) FOR BONE MINERAL DENSITY IMPRESSION: Referring Physician:  MELISSA O'SULLIVAN PATIENT: Name: Carla Curtis, Carla Curtis Patient ID: [id number] Birth Date: [birth year] Height: 65.5 in. Sex: Female Measured: 03/06/2016 Weight: 165.6 lbs. Indications: Advanced Age,  Caucasian, Estrogen Deficiency, Height Loss, Hysterectomy, Oophorectomy ( Bilateral), Post Menopausal Fractures: Treatments: Vitamin D ASSESSMENT: The BMD measured at AP Spine L1-L2 is 0.860 g/cm2 with a T-score of -2.5. This patient is considered osteoporotic according to [facility]) criteria. L- 3 and L-4 were excluded due to degenerative changes. Site Region Measured Date Measured Age WHO YA BMD Classification T-score AP Spine L1-L2 5/06/2016 [age] Osteoporosis -2.5 0.860 g/cm2 DualFemur Total Mean 5/06/2016 81.7 years Osteopenia -1.1 0.867 g/cm2  World Pharmacologist [facility]) criteria for post-menopausal, Caucasian Women: Normal       T-score at or above -1 SD Osteopenia   T-score between -1 and -2.5 SD Osteoporosis T-score at or below -2.5 SD RECOMMENDATION: [place] recommends that FDA-approved medical therapies be considered in postmenopausal women and men age 28 or older with a: 1. Hip or vertebral (clinical or morphometric) fracture. 2. T-score of < -2.5 at the spine or hip. 3. Ten-year fracture probability by FRAX of 3% or greater for hip fracture or 20% or greater for major osteoporotic fracture. All treatment decisions require clinical judgment and consideration of individual patient factors, including patient preferences, co-morbidities, previous drug use, risk factors not captured in the FRAX model (e.g. falls, vitamin D deficiency, increased bone turnover, interval significant decline in bone density) and possible under - or over-estimation of fracture risk by FRAX. All patients should ensure an adequate intake of dietary calcium (1200 mg/d) and vitamin D (800 IU daily) unless contraindicated. FOLLOW-UP: People with diagnosed cases of osteoporosis or at high risk for fracture should have regular bone mineral density tests. For patients eligible for Medicare, routine testing is allowed once every 2 years. The testing frequency can be increased to one  year for patients who have rapidly progressing disease, those who are receiving or discontinuing medical therapy to restore bone mass, or have additional risk factors. I have reviewed this report and agree with the above findings. [facility] Radiology Electronically Signed   By: Lajean Manes M.D.   On: 03/06/2016 13:56    Procedures Procedures (including critical care time)  Medications Ordered in ED Medications  sodium chloride 0.9 % bolus 1,000 mL (1,000 mLs Intravenous New Bag/Given 03/07/16 1653)     Initial Impression / Assessment and Plan / ED Course  I have reviewed the triage vital signs and the nursing notes.  Pertinent labs & imaging results that were available during my care of the patient were reviewed by me and considered in my medical decision making (see chart for details).  Clinical Course    82 yo F With a chief complaint of weakness. This been going on since this morning. Chest x-ray and UA negative for infection. Patient symptoms are more consistent with near-syncope. She has no murmurs consistent with aortic stenosis. No noted lower extremity edema. EKG without any acute findings. She is not anemic. Patient did have her flu shot yesterday. I discussed the results with patient. She will follow with her family physician.The patient was given IV fluids with significant improvement of her symptoms.  6:07 PM:  I have discussed the diagnosis/risks/treatment options with the patient and family and believe the pt to be eligible for discharge home to follow-up with PCP. We also discussed returning to the ED immediately if new or worsening sx occur. We discussed the sx which are most concerning (e.g., sudden worsening pain, fever, inability to tolerate by mouth) that necessitate immediate return. Medications administered to the patient during their visit and any new prescriptions provided to the patient are listed below.  Medications given during this visit Medications  sodium  chloride 0.9 % bolus 1,000 mL (1,000 mLs Intravenous New Bag/Given 03/07/16 1653)     The patient appears reasonably screen and/or stabilized for discharge and I doubt any other medical condition or other [facility] requiring further screening, evaluation, or treatment in the ED at this time prior to discharge.    Final Clinical Impressions(s) / ED Diagnoses   Final diagnoses:  Weakness  New Prescriptions New Prescriptions   No medications on file     Deno Etienne, DO 03/07/16 [place], DO 03/07/16 [id number]

## 2016-03-07 NOTE — ED Notes (Signed)
Pt ambulatory to BR

## 2016-03-08 ENCOUNTER — Encounter: Payer: Self-pay | Admitting: Cardiology

## 2016-03-08 NOTE — Telephone Encounter (Signed)
Patient has been notified about her results she voices understanding, and will begin Fosamax. Pt has a hospital f/u 03/15/16. PC

## 2016-03-09 LAB — URINE CULTURE: Culture: >=100000 — AB

## 2016-03-10 ENCOUNTER — Telehealth (HOSPITAL_BASED_OUTPATIENT_CLINIC_OR_DEPARTMENT_OTHER): Payer: Self-pay

## 2016-03-10 NOTE — Telephone Encounter (Signed)
Post ED Visit - Positive Culture Follow-up  Culture report reviewed by antimicrobial stewardship pharmacist:  []  Elenor Quinones, Pharm.D. []  Heide Guile, Pharm.D., BCPS []  Parks Neptune, Pharm.D. []  Alycia Rossetti, Pharm.D., BCPS []  [place], [place].D., BCPS, AAHIVP []  Legrand Como, Pharm.D., BCPS, AAHIVP []  Milus Glazier, Pharm.D. []  Stephens November, Pharm.D. Dimitri Ped Pharm D Positive urine culture and no further patient follow-up is required at this time.  Genia Del 03/10/2016, 9:12 AM

## 2016-03-11 ENCOUNTER — Ambulatory Visit (INDEPENDENT_AMBULATORY_CARE_PROVIDER_SITE_OTHER): Payer: Medicare Other | Admitting: Cardiology

## 2016-03-11 ENCOUNTER — Encounter: Payer: Self-pay | Admitting: Cardiology

## 2016-03-11 VITALS — BP 116/68 | HR 56 | Ht 65.5 in | Wt 167.1 lb

## 2016-03-11 DIAGNOSIS — I480 Paroxysmal atrial fibrillation: Secondary | ICD-10-CM | POA: Diagnosis not present

## 2016-03-11 NOTE — Progress Notes (Signed)
03/11/2016 Carla Curtis   [birth year]  [id number]  Primary Physician Nance Pear., NP Primary Cardiologist: Dr. Stanford Breed   Reason for Visit/CC: Low Blood Pressure  HPI:  Pt is a 82 year old female, followed by Dr. Stanford Breed. She is originally from [place] and moved here several years ago. She has a history of paroxsysmal atrial fibrillation. Per Dr. Jacalyn Lefevre notes, Pt was apparently having an echocardiogram and noted to be in atrial fibrillation, at the time it was discovered. She has been on Cardizem and Coumadin since then her INRs are followed in our [facility]. She also has a h/o chronic chest pain; had stress test in [facility] that apparently was neg. Echocardiogram here in August of 2012 showed normal LV function, mild left ventricular hypertrophy and moderate left atrial enlargement. Carotid Dopplers May 2015 showed no carotid stenosis. Nuclear study in September 2015 showed an ejection fraction of 74% and normal perfusion.  She was last seen by Dr. Stanford Breed in clinic in Feb. 2017. Per office note, she had no complaints at that time. EKG showed normal sinus rhythm. She denied any chest pain and dyspnea. She was felt to be stable from a cardiac standpoint. Dr. Stanford Breed recommended 1 year f/u.  She presents to clinic today for post urgent care follow-up. Several days ago she was seen by her PCP for her annual wellness exam and her blood pressure was noted to be soft at 100/47. Patient reports that this is lower than her norm. She felt a bit woozy at that time but denied any other symptoms. Throughout the day, she continued to feel unwell prompting her to report to an urgent care for evaluation. She underwent workup including basic laboratory work. She was told that she was mildly dehydrated and was given IV fluids. Her blood pressure improved. She was instructed to follow-up in our office for further evaluation.   Her BP in clinic today is 116/68. EKG shows sinus bradycardia  with a pulse rate 56 bpm. She is asymptomatic without lightheadedness, dizziness, syncope/ near syncope. She denies any recent chest pain and no dyspnea. In retrospect, she admits that she probably was not drinking enough fluids. She reports that she has issues with urinary incontinence thus she restricts her fluid intake to prevent frequent urinary/ incontinence. She reports that she has to wear Depends to help with leakage.     Current Meds  Medication Sig  . alendronate (FOSAMAX) 70 MG tablet Take 1 tablet (70 mg total) by mouth every 7 (seven) days. Take with a full glass of water on an empty stomach.  . Cyanocobalamin (VITAMIN B 12 PO) Take 1 lozenge by mouth daily. 1023mcg daily  . diltiazem (CARDIZEM) 120 MG tablet TAKE 1 TABLET (120 MG TOTAL) BY MOUTH DAILY. PATIENT NEEDS TO CONTACT OFFICE FOR ADDITIONAL REFILLS  . Krill Oil Omega-3 500 MG CAPS Take 1 capsule by mouth daily.  . Vitamin D-Vitamin K (D3 + K2 DOTS PO) Take 1 tablet by mouth daily.  Marland Kitchen warfarin (COUMADIN) 5 MG tablet Take 1 tablet by mouth daily or as directed by coumadin clinic   No Known Allergies Past Medical History:  Diagnosis Date  . Atrial fibrillation ([place])    on Coumadin since mid 2010  . Incontinence   . Macular degeneration   . Normal cardiac stress test 01/27/09   lvef 73% done at [facility] (nuclear stress)  . Squamous cell carcinoma 10/14   left side of nose  . Vitamin D deficiency 12/13/2011   Family History  Problem Relation Age of Onset  . Hypertension Mother   . COPD Father     smoker  . Cancer Father     lung  . Cancer Sister     breast  . Bell's palsy Sister   . Heart disease Sister     CHF  . Cancer Daughter     breast/ stage 1- s/p lumpectomy and radiation  . Cancer Other     Hodgkins Disease  . Cancer Daughter    Past Surgical History:  Procedure Laterality Date  . ABDOMINAL HYSTERECTOMY  2007  . ARM SKIN LESION BIOPSY / EXCISION     pre-cancerous  . Bilateral mammoplasty    .  LAMINECTOMY  1989 and 1990   L4, L5  . TONSILLECTOMY AND ADENOIDECTOMY  1941   Social History   Social History  . Marital status: Widowed    Spouse name: N/A  . Number of children: 2  . Years of education: N/A   Occupational History  .  Retired   Social History Main Topics  . Smoking status: Passive Smoke Exposure - Never Smoker  . Smokeless tobacco: Never Used     Comment: Lived with smokers   . Alcohol use 0.0 oz/week     Comment: rare- 1 drink/ month  . Drug use: No  . Sexual activity: No   Other Topics Concern  . Not on file   Social History Narrative   Regular Exercise- no     Review of Systems: General: negative for chills, fever, night sweats or weight changes.  Cardiovascular: negative for chest pain, dyspnea on exertion, edema, orthopnea, palpitations, paroxysmal nocturnal dyspnea or shortness of breath Dermatological: negative for rash Respiratory: negative for cough or wheezing Urologic: negative for hematuria Abdominal: negative for nausea, vomiting, diarrhea, bright red blood per rectum, melena, or hematemesis Neurologic: negative for visual changes, syncope, or dizziness All other systems reviewed and are otherwise negative except as noted above.   Physical Exam:  Blood pressure 116/68, pulse (!) 56, height 5' 5.5" (1.664 m), weight 167 lb 1.9 oz (75.8 kg).  General appearance: alert, cooperative and no distress Neck: no carotid bruit and no JVD Lungs: clear to auscultation bilaterally Heart: regular rate and rhythm, S1, S2 normal, no murmur, click, rub or gallop Extremities: no LEE Pulses: 2+ and symmetric Skin: warm and dry Neurologic: Grossly normal  EKG sinus brady. 56 bpm.   ASSESSMENT AND PLAN:   1. PAF:  Sinus rhythm. Heart rate is controlled in the 50s on Cardizem. Patient asymptomatic. She denies any symptoms of breakthrough atrial fibrillation. She is on chronic anticoagulation therapy with Coumadin for stroke prophylaxis.  2. Chronic  Anticoagulation Therapy: on Coumadin for afib. INRs are followed in our Coumadin Clinic.  She denies any abnormal bleeding and no falls.  3. Hypotension:  Blood pressure has improved. She was recently seen in urgent care and was felt to be dehydrated due to decreased Po intake of fluids and issues with incontinence. She was given IV fluids. She denies any recurrent issues. Her blood pressure in clinic today is stable. It is recommended that the patient invest in a home blood pressure cuff, to monitor BP if recurrent symptoms. She was encouraged to stay well hydrated with fluids. Her blood pressure is stable, thus will continue Cardizem for atrial fibrillation. Patient instructed to take medication with breakfast, given effects on BP.   PLAN  Continue routine f/u with Dr. Stanford Breed yearly.   Lyda Jester PA-C 03/11/2016 12:01 PM

## 2016-03-11 NOTE — Patient Instructions (Signed)
Medication Instructions:   Your physician recommends that you continue on your current medications as directed. Please refer to the Current Medication list given to you today.   If you need a refill on your cardiac medications before your next appointment, please call your pharmacy.  Labwork: NONE ORDER TODAY    Testing/Procedures: NONE ORDER TODAY    Follow-Up: MAKE APPT ONCE YOU RECEIVE REMINDER LETTER TO SEE DR Stanford Breed IN [place]   Any Other Special Instructions Will Be Listed Below (If Applicable).  TO PURCHASE YOUR OWN HOME BLOOD PRESSURE MONITORING SYSTEM TO MONITOR BLOOD PRESSURE   KEEP YOUR SELF HYDRATED WITH DRINKING PLENTY OF WATER

## 2016-03-12 ENCOUNTER — Telehealth: Payer: Self-pay | Admitting: Family

## 2016-03-12 MED FILL — ALENDRONATE NA 70 MG TAB: 70 | 28 days supply | Qty: 4 | Fill #1

## 2016-03-12 NOTE — Telephone Encounter (Signed)
Pt dropped off application for renewal of disability parking placard, pt will pick up when available, documents placed in tray at front office

## 2016-03-14 MED FILL — dilTIAZem HCL 120 MG TABS: 120 | 30 days supply | Qty: 30 | Fill #7

## 2016-03-15 ENCOUNTER — Inpatient Hospital Stay: Payer: Medicare Other | Admitting: Family

## 2016-03-20 NOTE — Telephone Encounter (Signed)
Application forwarded to supervising MD, Charlett Blake. Will notify pt once complete.

## 2016-03-22 NOTE — Telephone Encounter (Signed)
Form completed. Copy sent for scanning and original placed at front desk for pt to pick up. Detailed message left on home # and to call if any questions.

## 2016-04-12 ENCOUNTER — Other Ambulatory Visit: Payer: Self-pay | Admitting: Cardiology

## 2016-04-12 MED FILL — WARFARIN SODIUM 5 MG TABLET: 5 | 30 days supply | Qty: 30 | Fill #0

## 2016-04-15 ENCOUNTER — Ambulatory Visit (INDEPENDENT_AMBULATORY_CARE_PROVIDER_SITE_OTHER): Payer: Medicare Other

## 2016-04-15 DIAGNOSIS — I480 Paroxysmal atrial fibrillation: Secondary | ICD-10-CM | POA: Diagnosis not present

## 2016-04-15 DIAGNOSIS — I4891 Unspecified atrial fibrillation: Secondary | ICD-10-CM

## 2016-04-15 DIAGNOSIS — Z5181 Encounter for therapeutic drug level monitoring: Secondary | ICD-10-CM | POA: Diagnosis not present

## 2016-04-15 LAB — POCT INR: INR: 2.2

## 2016-04-16 MED FILL — dilTIAZem HCL 120 MG TABS: 120 | 30 days supply | Qty: 30 | Fill #8

## 2016-04-16 MED FILL — ALENDRONATE NA 70 MG TAB: 70 | 28 days supply | Qty: 4 | Fill #2

## 2016-05-15 MED FILL — ALENDRONATE NA 70 MG TAB: 70 | 28 days supply | Qty: 4 | Fill #3

## 2016-05-20 DIAGNOSIS — H5203 Hypermetropia, bilateral: Secondary | ICD-10-CM | POA: Diagnosis not present

## 2016-05-20 DIAGNOSIS — H35363 Drusen (degenerative) of macula, bilateral: Secondary | ICD-10-CM | POA: Diagnosis not present

## 2016-05-20 DIAGNOSIS — H524 Presbyopia: Secondary | ICD-10-CM | POA: Diagnosis not present

## 2016-05-20 DIAGNOSIS — H353132 Nonexudative age-related macular degeneration, bilateral, intermediate dry stage: Secondary | ICD-10-CM | POA: Diagnosis not present

## 2016-05-20 DIAGNOSIS — H16223 Keratoconjunctivitis sicca, not specified as Sjogren's, bilateral: Secondary | ICD-10-CM | POA: Diagnosis not present

## 2016-05-23 MED FILL — WARFARIN SODIUM 5 MG TABLET: 5 | 30 days supply | Qty: 30 | Fill #1

## 2016-05-23 MED FILL — dilTIAZem HCL 120 MG TABS: 120 | 30 days supply | Qty: 30 | Fill #9

## 2016-05-27 ENCOUNTER — Ambulatory Visit (INDEPENDENT_AMBULATORY_CARE_PROVIDER_SITE_OTHER): Payer: Medicare Other

## 2016-05-27 DIAGNOSIS — Z5181 Encounter for therapeutic drug level monitoring: Secondary | ICD-10-CM

## 2016-05-27 DIAGNOSIS — I480 Paroxysmal atrial fibrillation: Secondary | ICD-10-CM | POA: Diagnosis not present

## 2016-05-27 DIAGNOSIS — I4891 Unspecified atrial fibrillation: Secondary | ICD-10-CM | POA: Diagnosis not present

## 2016-05-27 LAB — POCT INR: INR: 2.1

## 2016-06-24 MED FILL — ALENDRONATE NA 70 MG TAB: 70 | 28 days supply | Qty: 4 | Fill #4

## 2016-06-25 MED FILL — dilTIAZem HCL 120 MG TABS: 120 | 30 days supply | Qty: 30 | Fill #10

## 2016-06-25 MED FILL — WARFARIN SODIUM 5 MG TABLET: 5 | 30 days supply | Qty: 30 | Fill #2

## 2016-07-10 ENCOUNTER — Ambulatory Visit (INDEPENDENT_AMBULATORY_CARE_PROVIDER_SITE_OTHER): Payer: Medicare Other | Admitting: *Deleted

## 2016-07-10 DIAGNOSIS — I4891 Unspecified atrial fibrillation: Secondary | ICD-10-CM | POA: Diagnosis not present

## 2016-07-10 DIAGNOSIS — I480 Paroxysmal atrial fibrillation: Secondary | ICD-10-CM

## 2016-07-10 DIAGNOSIS — Z5181 Encounter for therapeutic drug level monitoring: Secondary | ICD-10-CM | POA: Diagnosis not present

## 2016-07-10 LAB — POCT INR: INR: 2.8

## 2016-07-18 MED FILL — ALENDRONATE NA 70 MG TAB: 70 | 28 days supply | Qty: 4 | Fill #5

## 2016-07-23 MED FILL — WARFARIN SODIUM 5 MG TABLET: 5 | 30 days supply | Qty: 30 | Fill #3

## 2016-07-23 MED FILL — dilTIAZem HCL 120 MG TABS: 120 | 30 days supply | Qty: 30 | Fill #11

## 2016-08-13 ENCOUNTER — Other Ambulatory Visit: Payer: Self-pay | Admitting: Pharmacist Clinician (PhC)/ Clinical Pharmacy Specialist

## 2016-08-13 ENCOUNTER — Other Ambulatory Visit: Payer: Self-pay

## 2016-08-13 ENCOUNTER — Telehealth: Payer: Self-pay | Admitting: *Deleted

## 2016-08-13 MED ORDER — ALENDRONATE SODIUM 70 MG PO TABS: 70.0000 mg | ORAL_TABLET | ORAL | 1 refills | Status: DC

## 2016-08-13 MED ORDER — WARFARIN SODIUM 5 MG PO TABS: ORAL_TABLET | ORAL | 1 refills | Status: DC

## 2016-08-13 MED ORDER — DILTIAZEM HCL 120 MG PO TABS: 120.0000 mg | ORAL_TABLET | Freq: Every day | ORAL | 0 refills | Status: DC

## 2016-08-13 MED FILL — ALENDRONATE NA 70 MG TAB: 70 | 84 days supply | Qty: 12 | Fill #0

## 2016-08-13 NOTE — Telephone Encounter (Signed)
Please call pt to see if she would like to go ahead and schedule annual wellness exam with Melissa on or after 03/06/17.

## 2016-08-13 NOTE — Telephone Encounter (Signed)
1 year follow up with [place] is OK please.

## 2016-08-13 NOTE — Telephone Encounter (Signed)
Carla Curtis-- pt last seen by [place] in 02/2016 and has not future appts scheduled. When should pt follow up in the office?  Received fax from [place] that pt is requesting a 90 day supply of alendronate 70mg . Rx sent.

## 2016-08-14 DIAGNOSIS — Z6827 Body mass index (BMI) 27.0-27.9, adult: Secondary | ICD-10-CM | POA: Diagnosis not present

## 2016-08-14 DIAGNOSIS — R32 Unspecified urinary incontinence: Secondary | ICD-10-CM | POA: Diagnosis not present

## 2016-08-14 DIAGNOSIS — N3941 Urge incontinence: Secondary | ICD-10-CM | POA: Diagnosis not present

## 2016-08-14 MED FILL — OXYBUTYNIN CL ER 5 MG TAB: 5 | 30 days supply | Qty: 30 | Fill #0

## 2016-08-15 MED FILL — WARFARIN SODIUM 5 MG TABLET: 5 | 90 days supply | Qty: 90 | Fill #0

## 2016-08-15 MED FILL — dilTIAZem HCL 120 MG TABS: 120 | 90 days supply | Qty: 90 | Fill #0

## 2016-08-21 ENCOUNTER — Ambulatory Visit (INDEPENDENT_AMBULATORY_CARE_PROVIDER_SITE_OTHER): Payer: Medicare Other | Admitting: Pharmacist

## 2016-08-21 DIAGNOSIS — I480 Paroxysmal atrial fibrillation: Secondary | ICD-10-CM | POA: Diagnosis not present

## 2016-08-21 DIAGNOSIS — Z5181 Encounter for therapeutic drug level monitoring: Secondary | ICD-10-CM

## 2016-08-21 DIAGNOSIS — I4891 Unspecified atrial fibrillation: Secondary | ICD-10-CM

## 2016-08-21 LAB — POCT INR: INR: 2.6

## 2016-08-21 NOTE — Telephone Encounter (Signed)
Called pt. Unable to get through to speak with her. Pts phone line says that all circuits are busy. I will try pt again later.

## 2016-08-26 NOTE — Telephone Encounter (Signed)
Carla Curtis-- will try to reach pt again to schedule below appt?

## 2016-08-30 IMAGING — CR DG CHEST 2V
2 series · 2 of 2 positions shown · non-contrast
Comparison: 01/07/2011.

CLINICAL DATA: Cough and congestion for 2 weeks.

EXAM:
CHEST  2 VIEW

[w chest pa]
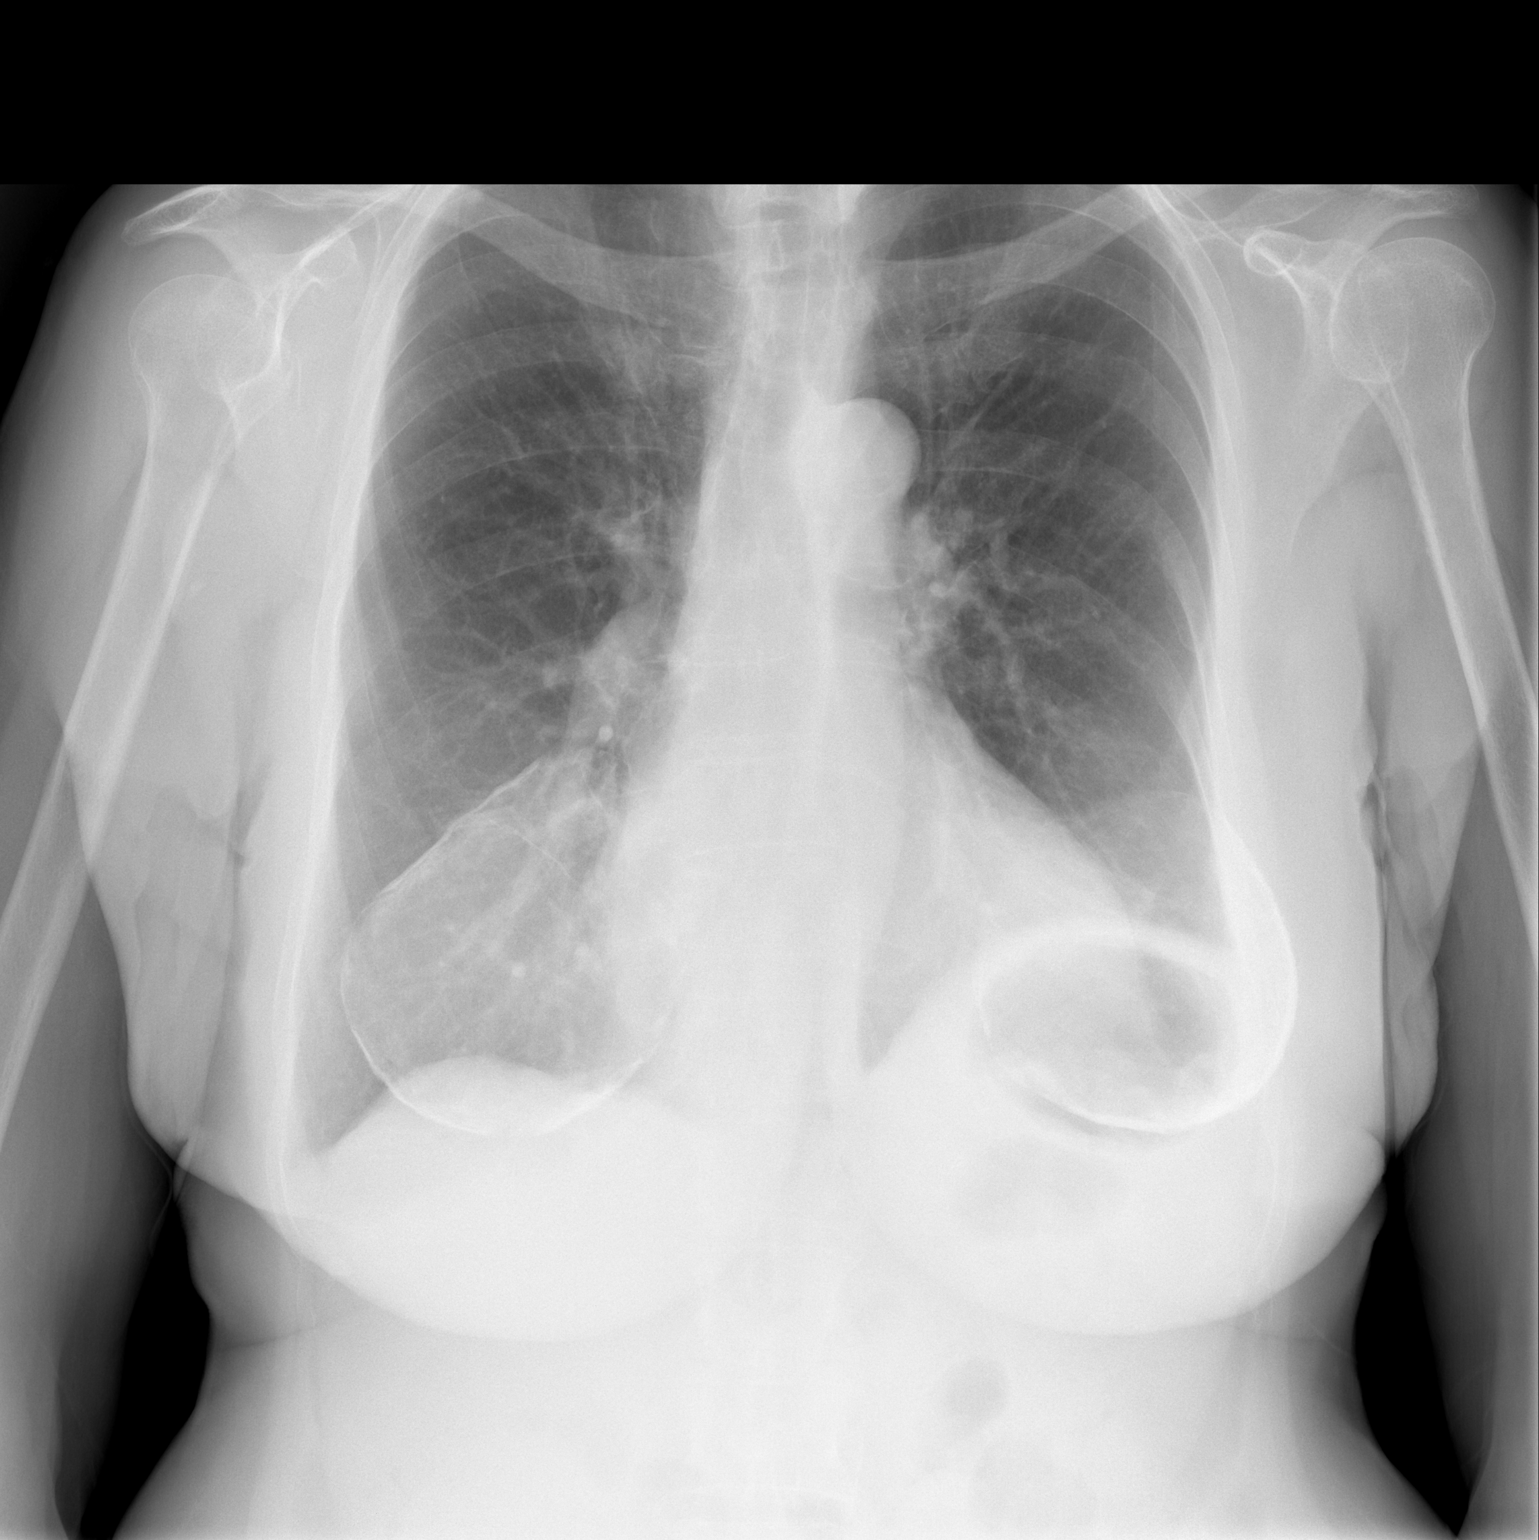

[w chest lat]
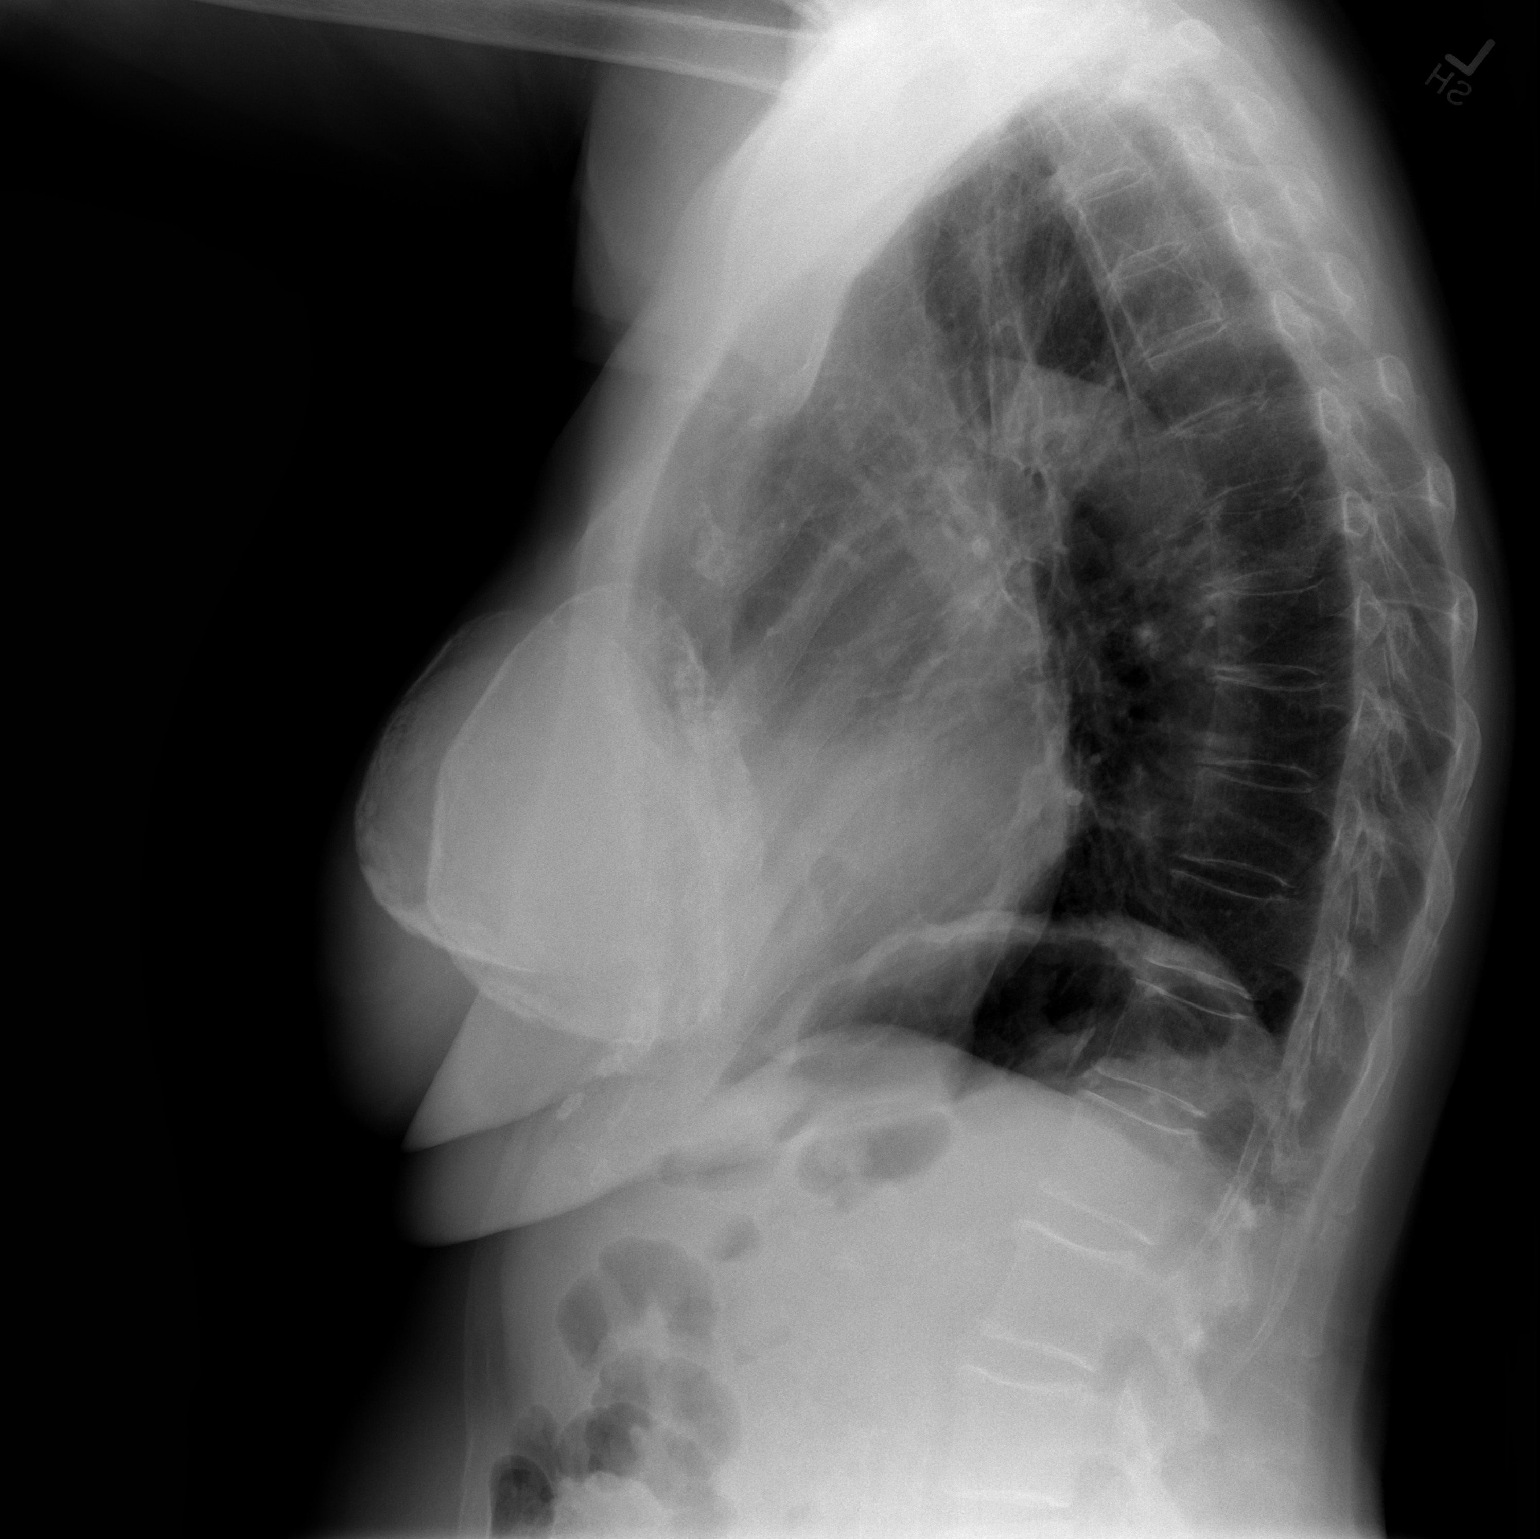

[2 of 2 positions shown; findings below may reference images not displayed]

FINDINGS: The cardiac silhouette, mediastinal and hilar contours are within
normal limits and stable. The lungs are clear of acute process.
Stable upper lobe scarring changes. No pleural effusion. Stable mild
eventration of the left hemidiaphragm.
IMPRESSION: No acute cardiopulmonary findings. No change since prior
examination.

## 2016-09-09 MED FILL — OXYBUTYNIN CL ER 5 MG TAB: 5 | 30 days supply | Qty: 30 | Fill #1

## 2016-09-11 NOTE — Telephone Encounter (Signed)
Called pt. No answer. No vm set up.

## 2016-09-13 DIAGNOSIS — H353132 Nonexudative age-related macular degeneration, bilateral, intermediate dry stage: Secondary | ICD-10-CM | POA: Diagnosis not present

## 2016-09-13 DIAGNOSIS — H35363 Drusen (degenerative) of macula, bilateral: Secondary | ICD-10-CM | POA: Diagnosis not present

## 2016-10-01 DIAGNOSIS — L821 Other seborrheic keratosis: Secondary | ICD-10-CM | POA: Diagnosis not present

## 2016-10-01 DIAGNOSIS — L570 Actinic keratosis: Secondary | ICD-10-CM | POA: Diagnosis not present

## 2016-10-01 DIAGNOSIS — L718 Other rosacea: Secondary | ICD-10-CM | POA: Diagnosis not present

## 2016-10-01 DIAGNOSIS — Z85828 Personal history of other malignant neoplasm of skin: Secondary | ICD-10-CM | POA: Diagnosis not present

## 2016-10-01 DIAGNOSIS — Z08 Encounter for follow-up examination after completed treatment for malignant neoplasm: Secondary | ICD-10-CM | POA: Diagnosis not present

## 2016-10-02 ENCOUNTER — Ambulatory Visit (INDEPENDENT_AMBULATORY_CARE_PROVIDER_SITE_OTHER): Payer: Medicare Other | Admitting: *Deleted

## 2016-10-02 DIAGNOSIS — I4891 Unspecified atrial fibrillation: Secondary | ICD-10-CM

## 2016-10-02 DIAGNOSIS — I480 Paroxysmal atrial fibrillation: Secondary | ICD-10-CM

## 2016-10-02 DIAGNOSIS — Z5181 Encounter for therapeutic drug level monitoring: Secondary | ICD-10-CM | POA: Diagnosis not present

## 2016-10-02 LAB — POCT INR: INR: 2.8

## 2016-10-02 MED FILL — metroNIDAZOLE 0.75 % CREA: 0.75 | 20 days supply | Qty: 45 | Fill #0

## 2016-10-09 MED FILL — OXYBUTYNIN CL ER 5 MG TAB: 5 | 30 days supply | Qty: 30 | Fill #2

## 2016-11-11 MED FILL — OXYBUTYNIN CL ER 5 MG TAB: 5 | 30 days supply | Qty: 30 | Fill #3

## 2016-11-18 ENCOUNTER — Ambulatory Visit (INDEPENDENT_AMBULATORY_CARE_PROVIDER_SITE_OTHER): Payer: Medicare Other | Admitting: *Deleted

## 2016-11-18 DIAGNOSIS — I480 Paroxysmal atrial fibrillation: Secondary | ICD-10-CM

## 2016-11-18 DIAGNOSIS — I4891 Unspecified atrial fibrillation: Secondary | ICD-10-CM | POA: Diagnosis not present

## 2016-11-18 DIAGNOSIS — Z5181 Encounter for therapeutic drug level monitoring: Secondary | ICD-10-CM

## 2016-11-18 LAB — POCT INR: INR: 2

## 2016-11-21 ENCOUNTER — Other Ambulatory Visit: Payer: Self-pay | Admitting: Cardiology

## 2016-11-21 MED FILL — dilTIAZem HCL 120 MG TABS: 120 | 90 days supply | Qty: 90 | Fill #0

## 2016-12-23 MED FILL — OXYBUTYNIN CL ER 5 MG TAB: 5 | 30 days supply | Qty: 30 | Fill #4

## 2016-12-26 LAB — HM DIABETES EYE EXAM

## 2017-01-14 ENCOUNTER — Encounter (INDEPENDENT_AMBULATORY_CARE_PROVIDER_SITE_OTHER): Payer: Medicare Other | Admitting: Internal Medicine

## 2017-01-14 ENCOUNTER — Encounter: Payer: Self-pay | Admitting: Internal Medicine

## 2017-01-14 VITALS — Ht 66.0 in

## 2017-01-14 DIAGNOSIS — E559 Vitamin D deficiency, unspecified: Secondary | ICD-10-CM

## 2017-01-14 NOTE — Progress Notes (Signed)
Pt unable to stay for visit, has obligation at [facility] and afraid to be late. Pt rescheduled.

## 2017-01-17 NOTE — Progress Notes (Signed)
Noted  

## 2017-01-24 MED FILL — OXYBUTYNIN CL ER 5 MG TAB: 5 | 30 days supply | Qty: 30 | Fill #5

## 2017-01-27 ENCOUNTER — Telehealth: Payer: Self-pay | Admitting: Family

## 2017-01-27 ENCOUNTER — Ambulatory Visit (INDEPENDENT_AMBULATORY_CARE_PROVIDER_SITE_OTHER): Payer: Medicare Other | Admitting: Family

## 2017-01-27 ENCOUNTER — Encounter: Payer: Self-pay | Admitting: Family

## 2017-01-27 VITALS — BP 111/51 | HR 52 | Temp 97.9°F | Resp 97 | Ht 66.0 in | Wt 158.2 lb

## 2017-01-27 DIAGNOSIS — R1900 Intra-abdominal and pelvic swelling, mass and lump, unspecified site: Secondary | ICD-10-CM | POA: Diagnosis not present

## 2017-01-27 DIAGNOSIS — N8110 Cystocele, unspecified: Secondary | ICD-10-CM | POA: Diagnosis not present

## 2017-01-27 DIAGNOSIS — R600 Localized edema: Secondary | ICD-10-CM | POA: Diagnosis not present

## 2017-01-27 LAB — BASIC METABOLIC PANEL
BUN: 13 mg/dL (ref 6–23)
CALCIUM: 9.5 mg/dL (ref 8.4–10.5)
CO2: 28 mEq/L (ref 19–32)
Chloride: 105 mEq/L (ref 96–112)
Creatinine, Ser: 0.72 mg/dL (ref 0.40–1.20)
GFR: 82.32 mL/min (ref >60.00)
Glucose, Bld: 96 mg/dL (ref 70–99)
POTASSIUM: 4 meq/L (ref 3.5–5.1)
SODIUM: 139 meq/L (ref 135–145)

## 2017-01-27 NOTE — Telephone Encounter (Signed)
Could we please work on initiating prolia approval for Carla Curtis?

## 2017-01-27 NOTE — Patient Instructions (Signed)
You will be contacted about scheduling your CT scan to check for hernia. You will be contacted about your referral to Urogynecology for evaluation of your bladder prolapse. For your leg swelling, you can try wearing compression hose as well as elevating as possible.  Please remain off of fosamax. We will work in Biochemist, clinical for [employer] injection.

## 2017-01-27 NOTE — Progress Notes (Signed)
Subjective:    Patient ID: Carla Curtis, female    DOB: March 05, 1933, 83 y.o.   MRN: [id number]  HPI  Carla Curtis is an 83 yr old female who presents today with several concerns:  1) "cyst" - reports that cyst is present on right lower pelvic area x 2 months.  Concern about recurrent bladder prolapse.  2) Bilateral LE edema-   3) Osteoporosis- stopped fosamax due to "redness of face."   Review of Systems See HPI  Past Medical History:  Diagnosis Date  . Atrial fibrillation ([place])    on Coumadin since mid 2010  . Incontinence   . Macular degeneration   . Normal cardiac stress test 01/27/09   lvef 73% done at [facility] (nuclear stress)  . Squamous cell carcinoma 10/14   left side of nose  . Vitamin D deficiency 12/13/2011     Social History   Social History  . Marital status: Widowed    Spouse name: N/A  . Number of children: 2  . Years of education: N/A   Occupational History  .  Retired   Social History Main Topics  . Smoking status: Passive Smoke Exposure - Never Smoker  . Smokeless tobacco: Never Used     Comment: Lived with smokers   . Alcohol use 0.0 oz/week     Comment: rare- 1 drink/ month  . Drug use: No  . Sexual activity: No   Other Topics Concern  . Not on file   Social History Narrative   Regular Exercise- no    Past Surgical History:  Procedure Laterality Date  . ABDOMINAL HYSTERECTOMY  2007  . ARM SKIN LESION BIOPSY / EXCISION     pre-cancerous  . Bilateral mammoplasty    . LAMINECTOMY  1989 and 1990   L4, L5  . TONSILLECTOMY AND ADENOIDECTOMY  1941    Family History  Problem Relation Age of Onset  . Hypertension Mother   . COPD Father        smoker  . Cancer Father        lung  . Cancer Sister        breast  . Bell's palsy Sister   . Heart disease Sister        CHF  . Cancer Daughter        breast/ stage 1- s/p lumpectomy and radiation  . Cancer Other        Hodgkins Disease  . Cancer Daughter     Allergies  Allergen  Reactions  . Fosamax [Alendronate Sodium]     Facial redness    Current Outpatient Prescriptions on File Prior to Visit  Medication Sig Dispense Refill  . Cyanocobalamin (VITAMIN B 12 PO) Take 1 lozenge by mouth daily. 1073mcg daily    . diltiazem (CARDIZEM) 120 MG tablet TAKE 1 TABLET (120 MG TOTAL) BY MOUTH DAILY. PLEASE MAKE APPOINTMENT FOR FURTHER REFILLS 90 tablet 0  . Vitamin D-Vitamin K (D3 + K2 DOTS PO) Take 1 tablet by mouth daily.    Marland Kitchen warfarin (COUMADIN) 5 MG tablet Take 1/2 to 1 tablet by mouth daily as directed by coumadin clinic 90 tablet 1  . alendronate (FOSAMAX) 70 MG tablet Take 1 tablet (70 mg total) by mouth every 7 (seven) days. Take with a full glass of water on an empty stomach. (Patient not taking: Reported on 01/27/2017) 12 tablet 1  . Krill Oil Omega-3 500 MG CAPS Take 1 capsule by mouth daily.  No current facility-administered medications on file prior to visit.     BP (!) 111/51 (BP Location: Right Arm, Cuff Size: Normal)   Pulse (!) 52   Temp 97.9 F (36.6 C) (Oral)   Resp (!) 97   Ht 5\' 6"  (1.676 m)   Wt 158 lb 3.2 oz (71.8 kg)   BMI 25.53 kg/m       Objective:   Physical Exam  Constitutional: She appears well-developed and well-nourished.  Cardiovascular: Normal rate, regular rhythm and normal heart sounds.   No murmur heard. Pulmonary/Chest: Effort normal and breath sounds normal. No respiratory distress. She has no wheezes.  Abdominal:  Reducible mass right lower abdomen.  Musculoskeletal:  3+ bilateral LE edema   Skin:  Chronic venous stasis changes bilateral LE  Psychiatric: She has a normal mood and affect. Her behavior is normal. Judgment and thought content normal.  GU: grade 2-3 cystocele        Assessment & Plan:  Edema- likely secondary to chronic venous stasis as well as side effect from diltiazem. She is not otherwise appear volume overloaded. I am afraid that adding diuretic will worsen her urinary incontinence.   RLQ  mass- suspect hernia, obtain CT scan to further evaluate  Bladder prolapse/urinary incontinence- will refer to urogyn for further evaluation

## 2017-01-28 ENCOUNTER — Encounter: Payer: Self-pay | Admitting: Family

## 2017-01-28 DIAGNOSIS — H524 Presbyopia: Secondary | ICD-10-CM | POA: Diagnosis not present

## 2017-01-28 DIAGNOSIS — H16223 Keratoconjunctivitis sicca, not specified as Sjogren's, bilateral: Secondary | ICD-10-CM | POA: Diagnosis not present

## 2017-01-28 DIAGNOSIS — H35363 Drusen (degenerative) of macula, bilateral: Secondary | ICD-10-CM | POA: Diagnosis not present

## 2017-01-28 DIAGNOSIS — H52203 Unspecified astigmatism, bilateral: Secondary | ICD-10-CM | POA: Diagnosis not present

## 2017-01-28 DIAGNOSIS — Z961 Presence of intraocular lens: Secondary | ICD-10-CM | POA: Diagnosis not present

## 2017-01-28 DIAGNOSIS — H353132 Nonexudative age-related macular degeneration, bilateral, intermediate dry stage: Secondary | ICD-10-CM | POA: Diagnosis not present

## 2017-01-29 ENCOUNTER — Encounter: Payer: Self-pay | Admitting: Cardiology

## 2017-02-03 ENCOUNTER — Ambulatory Visit (HOSPITAL_BASED_OUTPATIENT_CLINIC_OR_DEPARTMENT_OTHER)
Admission: RE | Admit: 2017-02-03 | Discharge: 2017-02-03 | Disposition: A | Discharge status: Home / Self Care | Payer: Medicare Other | Source: Ambulatory Visit | Attending: Family | Admitting: Family

## 2017-02-03 ENCOUNTER — Encounter (HOSPITAL_BASED_OUTPATIENT_CLINIC_OR_DEPARTMENT_OTHER): Payer: Self-pay

## 2017-02-03 ENCOUNTER — Telehealth: Payer: Self-pay | Admitting: Family

## 2017-02-03 DIAGNOSIS — R1900 Intra-abdominal and pelvic swelling, mass and lump, unspecified site: Secondary | ICD-10-CM | POA: Diagnosis not present

## 2017-02-03 DIAGNOSIS — K4090 Unilateral inguinal hernia, without obstruction or gangrene, not specified as recurrent: Secondary | ICD-10-CM

## 2017-02-03 DIAGNOSIS — K5730 Diverticulosis of large intestine without perforation or abscess without bleeding: Secondary | ICD-10-CM | POA: Diagnosis not present

## 2017-02-03 MED ORDER — IOPAMIDOL (ISOVUE-300) INJECTION 61%
100.0000 mL | Freq: Once | INTRAVENOUS | Status: AC | PRN
  Administered 2017-02-03: 100 mL via INTRAVENOUS

## 2017-02-03 NOTE — Telephone Encounter (Signed)
Note is also made of constipation. I would recommend that she add a fiber supplement, increase her fresh fruits and veggies and increase water intake.

## 2017-02-03 NOTE — Telephone Encounter (Signed)
Please let pt know that her CT scan confirms a right inguinal hernia. I would like to refer her to a general surgeon for evaluation.

## 2017-02-04 ENCOUNTER — Ambulatory Visit (INDEPENDENT_AMBULATORY_CARE_PROVIDER_SITE_OTHER): Payer: Medicare Other | Admitting: *Deleted

## 2017-02-04 DIAGNOSIS — Z5181 Encounter for therapeutic drug level monitoring: Secondary | ICD-10-CM | POA: Diagnosis not present

## 2017-02-04 DIAGNOSIS — I4891 Unspecified atrial fibrillation: Secondary | ICD-10-CM

## 2017-02-04 DIAGNOSIS — I480 Paroxysmal atrial fibrillation: Secondary | ICD-10-CM | POA: Diagnosis not present

## 2017-02-04 LAB — POCT INR: INR: 2.4

## 2017-02-04 NOTE — Telephone Encounter (Signed)
Left message for pt to return my call.

## 2017-02-04 NOTE — Telephone Encounter (Signed)
Pt returning your call

## 2017-02-04 NOTE — Telephone Encounter (Signed)
Notified pt and she voices understanding. Referral signed. 

## 2017-02-07 NOTE — Telephone Encounter (Signed)
[place]-- can you help with this?

## 2017-02-11 NOTE — Telephone Encounter (Signed)
Sent message via Email.

## 2017-02-12 NOTE — Progress Notes (Signed)
HPI: FU atrial fibrillation. Patient from [place]. Prior cardiac records not available. Pt was apparently having an echocardiogram and noted to be in atrial fibrillation. She has been on Cardizem and Coumadin since then. Also with chronic chest pain; had stress test in [facility] that apparently was neg. Echocardiogram here in August of 2012 showed normal LV function, mild left ventricular hypertrophy and moderate left atrial enlargement. Carotid Dopplers May 2015 showed no carotid stenosis. Nuclear study in September 2015 showed an ejection fraction of 74% and normal perfusion. Since last seen, patient has not had exertional chest pain, palpitations or syncope. She has mild dyspnea with more vigorous activities. No orthopnea, PND or pedal edema.  Current Outpatient Prescriptions  Medication Sig Dispense Refill  . Ascorbic Acid (VITAMIN C PO) Take by mouth daily.    . Cyanocobalamin (VITAMIN B 12 PO) Take 1 lozenge by mouth daily. 106mcg daily    . diltiazem (CARDIZEM) 120 MG tablet Take 1 tablet (120 mg total) by mouth daily. KEEP OV. 90 tablet 0  . Multiple Vitamins-Minerals (ICAPS AREDS 2 PO) Take by mouth daily.    Marland Kitchen oxybutynin (DITROPAN-XL) 5 MG 24 hr tablet Take 1 tablet by mouth daily.  11  . Psyllium (METAMUCIL FIBER PO) Take by mouth daily.    Marland Kitchen warfarin (COUMADIN) 5 MG tablet Take 1/2 to 1 tablet by mouth daily as directed by coumadin clinic 90 tablet 1   No current facility-administered medications for this visit.      Past Medical History:  Diagnosis Date  . Atrial fibrillation ([place])    on Coumadin since mid 2010  . Incontinence   . Macular degeneration   . Normal cardiac stress test 01/27/09   lvef 73% done at [facility] (nuclear stress)  . Squamous cell carcinoma 10/14   left side of nose  . Vitamin D deficiency 12/13/2011    Past Surgical History:  Procedure Laterality Date  . ABDOMINAL HYSTERECTOMY  2007  . ARM SKIN LESION BIOPSY / EXCISION     pre-cancerous    . Bilateral mammoplasty    . LAMINECTOMY  1989 and 1990   L4, L5  . TONSILLECTOMY AND ADENOIDECTOMY  1941    Social History   Social History  . Marital status: Widowed    Spouse name: N/A  . Number of children: 2  . Years of education: N/A   Occupational History  .  Retired   Social History Main Topics  . Smoking status: Passive Smoke Exposure - Never Smoker  . Smokeless tobacco: Never Used     Comment: Lived with smokers   . Alcohol use 0.0 oz/week     Comment: rare- 1 drink/ month  . Drug use: No  . Sexual activity: No   Other Topics Concern  . Not on file   Social History Narrative   Regular Exercise- no    Family History  Problem Relation Age of Onset  . Hypertension Mother   . COPD Father        smoker  . Cancer Father        lung  . Cancer Sister        breast  . Bell's palsy Sister   . Heart disease Sister        CHF  . Cancer Daughter        breast/ stage 1- s/p lumpectomy and radiation  . Cancer Other        Hodgkins Disease  . Cancer Daughter  ROS: no fevers or chills, productive cough, hemoptysis, dysphasia, odynophagia, melena, hematochezia, dysuria, hematuria, rash, seizure activity, orthopnea, PND, pedal edema, claudication. Remaining systems are negative.  Physical Exam: Well-developed well-nourished in no acute distress.  Skin is warm and dry.  HEENT is normal.  Neck is supple.  Chest is clear to auscultation with normal expansion.  Cardiovascular exam is regular rate and rhythm.  Abdominal exam nontender or distended. No masses palpated. Extremities show no edema. neuro grossly intact  ECG- Sinus rhythm with occasional PVC. No ST changes. personally reviewed  A/P  1 Paroxysmal atrial fibrillation-patient remains in sinus rhythm. We will continue with Cardizem for rate control if atrial fibrillation recurs. Continue Coumadin. We discussed NOACs and she will consider.  2 chest pain-no recent symptoms. Previous nuclear study  negative. We will not pursue further ischemia evaluation.   3 hypertension-blood pressure is controlled. Continue present medications.  Patient is moving to [facility] area. We will forward records to her physicians when she establishes.  Kirk Ruths, MD

## 2017-02-18 ENCOUNTER — Other Ambulatory Visit: Payer: Self-pay | Admitting: Cardiology

## 2017-02-18 MED FILL — dilTIAZem HCL 120 MG TABS: 120 | 90 days supply | Qty: 90 | Fill #0

## 2017-02-18 NOTE — Telephone Encounter (Signed)
REFILL 

## 2017-02-19 ENCOUNTER — Ambulatory Visit (INDEPENDENT_AMBULATORY_CARE_PROVIDER_SITE_OTHER): Payer: Medicare Other | Admitting: Cardiology

## 2017-02-19 ENCOUNTER — Encounter: Payer: Self-pay | Admitting: Cardiology

## 2017-02-19 VITALS — BP 109/61 | HR 53 | Ht 66.0 in | Wt 152.1 lb

## 2017-02-19 DIAGNOSIS — I10 Essential (primary) hypertension: Secondary | ICD-10-CM | POA: Diagnosis not present

## 2017-02-19 DIAGNOSIS — R072 Precordial pain: Secondary | ICD-10-CM

## 2017-02-19 DIAGNOSIS — I480 Paroxysmal atrial fibrillation: Secondary | ICD-10-CM

## 2017-02-19 MED FILL — OXYBUTYNIN CL ER 5 MG TAB: 5 | 30 days supply | Qty: 30 | Fill #6

## 2017-02-19 NOTE — Patient Instructions (Signed)
Your physician recommends that you schedule a follow-up appointment in: AS NEEDED  

## 2017-02-20 DIAGNOSIS — K4020 Bilateral inguinal hernia, without obstruction or gangrene, not specified as recurrent: Secondary | ICD-10-CM | POA: Diagnosis not present

## 2017-03-10 ENCOUNTER — Ambulatory Visit (INDEPENDENT_AMBULATORY_CARE_PROVIDER_SITE_OTHER): Payer: Medicare Other | Admitting: *Deleted

## 2017-03-10 DIAGNOSIS — Z5181 Encounter for therapeutic drug level monitoring: Secondary | ICD-10-CM | POA: Diagnosis not present

## 2017-03-10 DIAGNOSIS — I480 Paroxysmal atrial fibrillation: Secondary | ICD-10-CM

## 2017-03-10 DIAGNOSIS — I4891 Unspecified atrial fibrillation: Secondary | ICD-10-CM | POA: Diagnosis not present

## 2017-03-10 LAB — POCT INR: INR: 3.3

## 2017-04-07 ENCOUNTER — Ambulatory Visit (INDEPENDENT_AMBULATORY_CARE_PROVIDER_SITE_OTHER): Payer: Medicare Other | Admitting: Pharmacist

## 2017-04-07 DIAGNOSIS — I4891 Unspecified atrial fibrillation: Secondary | ICD-10-CM | POA: Diagnosis not present

## 2017-04-07 DIAGNOSIS — Z5181 Encounter for therapeutic drug level monitoring: Secondary | ICD-10-CM

## 2017-04-07 DIAGNOSIS — I480 Paroxysmal atrial fibrillation: Secondary | ICD-10-CM

## 2017-04-07 LAB — POCT INR: INR: 2.7

## 2017-04-08 DIAGNOSIS — N3941 Urge incontinence: Secondary | ICD-10-CM | POA: Diagnosis not present

## 2017-04-08 DIAGNOSIS — R351 Nocturia: Secondary | ICD-10-CM | POA: Diagnosis not present

## 2017-04-08 DIAGNOSIS — N390 Urinary tract infection, site not specified: Secondary | ICD-10-CM | POA: Diagnosis not present

## 2017-04-08 DIAGNOSIS — N8111 Cystocele, midline: Secondary | ICD-10-CM | POA: Diagnosis not present

## 2017-04-08 DIAGNOSIS — N8189 Other female genital prolapse: Secondary | ICD-10-CM | POA: Diagnosis not present

## 2017-04-08 MED FILL — FUROSEMIDE 20 MG TAB: 20 | 30 days supply | Qty: 30 | Fill #0

## 2017-04-11 MED FILL — SULFAMETHOXAZOLE-TMP DS TAB: 800-160 | 5 days supply | Qty: 10 | Fill #0

## 2017-04-14 MED FILL — WARFARIN SODIUM 5 MG TABLET: 5 | 90 days supply | Qty: 90 | Fill #1

## 2017-04-27 NOTE — Progress Notes (Signed)
Subjective:    Patient ID: Carla Curtis, female    DOB: August 01, 1932, 83 y.o.   MRN: [id number]  HPI  RLQ mass- noted last visit. Pt was sent for CT scan. CT scan revealed a RIH. A referral was made to general surgery. She saw [facility].  Saw [place] [facility], Was given rx for lasix by her bladder surgeon.  Reports that this may be helping some with her nighttime urinary frequency.   LE edema- last visit we suggested compression stockings. She thinks that her LE edema is improved.   Osteoporosis- last visit we initiated prolia approval from her insurance. We are still awaiting approval.   Lab Results  Component Value Date   HGBA1C 5.6 07/20/2013    Review of Systems    see HPI  Past Medical History:  Diagnosis Date  . Atrial fibrillation ([place])    on Coumadin since mid 2010  . Incontinence   . Macular degeneration   . Normal cardiac stress test 01/27/09   lvef 73% done at [facility] (nuclear stress)  . Squamous cell carcinoma 10/14   left side of nose  . Vitamin D deficiency 12/13/2011     Social History   Socioeconomic History  . Marital status: Widowed    Spouse name: Not on file  . Number of children: 2  . Years of education: Not on file  . Highest education level: Not on file  Social Needs  . Financial resource strain: Not on file  . Food insecurity - worry: Not on file  . Food insecurity - inability: Not on file  . Transportation needs - medical: Not on file  . Transportation needs - non-medical: Not on file  Occupational History    Employer: RETIRED  Tobacco Use  . Smoking status: Passive Smoke Exposure - Never Smoker  . Smokeless tobacco: Never Used  . Tobacco comment: Lived with smokers   Substance and Sexual Activity  . Alcohol use: Yes    Alcohol/week: 0.0 oz    Comment: rare- 1 drink/ month  . Drug use: No  . Sexual activity: No  Other Topics Concern  . Not on file  Social History Narrative   Regular Exercise- no    Past Surgical  History:  Procedure Laterality Date  . ABDOMINAL HYSTERECTOMY  2007  . ARM SKIN LESION BIOPSY / EXCISION     pre-cancerous  . Bilateral mammoplasty    . LAMINECTOMY  1989 and 1990   L4, L5  . TONSILLECTOMY AND ADENOIDECTOMY  1941    Family History  Problem Relation Age of Onset  . Hypertension Mother   . COPD Father        smoker  . Cancer Father        lung  . Cancer Sister        breast  . Bell's palsy Sister   . Heart disease Sister        CHF  . Cancer Daughter        breast/ stage 1- s/p lumpectomy and radiation  . Cancer Other        Hodgkins Disease  . Cancer Daughter     Allergies  Allergen Reactions  . Fosamax [Alendronate Sodium] Other (See Comments)    Facial redness Facial redness    Current Outpatient Medications on File Prior to Visit  Medication Sig Dispense Refill  . Cyanocobalamin (VITAMIN B 12 PO) Take 1 lozenge by mouth daily. 101mcg daily    . diltiazem (CARDIZEM)  120 MG tablet Take 1 tablet (120 mg total) by mouth daily. KEEP OV. 90 tablet 0  . furosemide (LASIX) 20 MG tablet Take 1 tablet daily by mouth.  11  . Multiple Vitamins-Minerals (ICAPS AREDS 2 PO) Take by mouth daily.    . Psyllium (METAMUCIL FIBER PO) Take daily as needed by mouth.     . warfarin (COUMADIN) 5 MG tablet Take 1/2 to 1 tablet by mouth daily as directed by coumadin clinic 90 tablet 1   No current facility-administered medications on file prior to visit.     BP (!) 117/52 (BP Location: Right Arm, Cuff Size: Normal)   Pulse (!) 54   Temp 97.8 F (36.6 C) (Oral)   Resp 16   Ht 5\' 6"  (1.676 m)   Wt 156 lb 6.4 oz (70.9 kg)   BMI 25.24 kg/m    Objective:   Physical Exam  Constitutional: She appears well-developed and well-nourished.  Cardiovascular: Normal rate, regular rhythm and normal heart sounds.  No murmur heard. Pulmonary/Chest: Effort normal and breath sounds normal. No respiratory distress. She has no wheezes.  Musculoskeletal:  1+ bilateral LE edema    Psychiatric: She has a normal mood and affect. Her behavior is normal. Judgment and thought content normal.          Assessment & Plan:  LE edema- improved on lasix- being managed by her urologist.  Bilateral Inguinal hernia- stable- pt has been advised not to pursue surgery unless hernias really start to bother her which they are not.

## 2017-04-28 ENCOUNTER — Encounter: Payer: Self-pay | Admitting: Family

## 2017-04-28 ENCOUNTER — Ambulatory Visit (INDEPENDENT_AMBULATORY_CARE_PROVIDER_SITE_OTHER): Payer: Medicare Other | Admitting: Family

## 2017-04-28 VITALS — BP 117/52 | HR 54 | Temp 97.8°F | Resp 16 | Ht 66.0 in | Wt 156.4 lb

## 2017-04-28 DIAGNOSIS — K4020 Bilateral inguinal hernia, without obstruction or gangrene, not specified as recurrent: Secondary | ICD-10-CM

## 2017-04-28 DIAGNOSIS — M810 Age-related osteoporosis without current pathological fracture: Secondary | ICD-10-CM | POA: Diagnosis not present

## 2017-04-28 DIAGNOSIS — Z23 Encounter for immunization: Secondary | ICD-10-CM

## 2017-04-28 DIAGNOSIS — R600 Localized edema: Secondary | ICD-10-CM

## 2017-04-28 NOTE — Assessment & Plan Note (Signed)
Will check status of prolia authorization.

## 2017-05-05 ENCOUNTER — Telehealth: Payer: Self-pay | Admitting: Family

## 2017-05-05 NOTE — Telephone Encounter (Signed)
Received prolia benefits for patient No PA required Patient has met $183 Medicare deductible BCBS secondary will cover co-insurance up to medicare allowable  Patient may owe approximately $0 OOP  Letter mailed informing patient of benefits

## 2017-05-06 ENCOUNTER — Ambulatory Visit (INDEPENDENT_AMBULATORY_CARE_PROVIDER_SITE_OTHER): Payer: Medicare Other | Admitting: *Deleted

## 2017-05-06 DIAGNOSIS — Z5181 Encounter for therapeutic drug level monitoring: Secondary | ICD-10-CM | POA: Diagnosis not present

## 2017-05-06 DIAGNOSIS — I480 Paroxysmal atrial fibrillation: Secondary | ICD-10-CM | POA: Diagnosis not present

## 2017-05-06 DIAGNOSIS — I4891 Unspecified atrial fibrillation: Secondary | ICD-10-CM

## 2017-05-06 LAB — POCT INR: INR: 3.2

## 2017-05-06 NOTE — Patient Instructions (Signed)
Skip today's dose, then Continue same dosage 1 tablet every day except 1/2 tablet on Mondays, Wednesdays, and Fridays.  Recheck in 3 weeks. Call us with you have any medication change # [phone] [facility], Main # [phone].

## 2017-05-06 NOTE — Telephone Encounter (Signed)
Left message for pt to return my call.

## 2017-05-08 NOTE — Telephone Encounter (Signed)
Pt has questions regarding Prolia: How often does she get the medicine? And Does it stop bone growth?

## 2017-05-09 NOTE — Telephone Encounter (Signed)
Notified pt. She is concerned about starting this medication. Discussed below concerns. Pt states she would like to start the medication after the first of the year. Reports that she read information stating the Prolia may stop new bone growth.  Please advise?

## 2017-05-09 NOTE — Telephone Encounter (Signed)
Prolia does should not stop bone growth. It helps to rebuild and maintain current bone density. It is given every 6 months.

## 2017-05-12 NOTE — Telephone Encounter (Signed)
[place]-- please see below. I wasn't sure if you would need to verify benefits again in January before pt starts Prolia?  Notified pt and she still wishes to wait until January to begin Prolia.

## 2017-05-21 ENCOUNTER — Other Ambulatory Visit: Payer: Self-pay | Admitting: Cardiology

## 2017-05-21 MED FILL — FUROSEMIDE 20 MG TAB: 20 | 30 days supply | Qty: 30 | Fill #1

## 2017-05-21 MED FILL — dilTIAZem HCL 120 MG TABS: 120 | 90 days supply | Qty: 90 | Fill #0

## 2017-06-04 ENCOUNTER — Ambulatory Visit (INDEPENDENT_AMBULATORY_CARE_PROVIDER_SITE_OTHER): Payer: Medicare Other

## 2017-06-04 DIAGNOSIS — Z5181 Encounter for therapeutic drug level monitoring: Secondary | ICD-10-CM

## 2017-06-04 DIAGNOSIS — I480 Paroxysmal atrial fibrillation: Secondary | ICD-10-CM | POA: Diagnosis not present

## 2017-06-04 DIAGNOSIS — I4891 Unspecified atrial fibrillation: Secondary | ICD-10-CM | POA: Diagnosis not present

## 2017-06-04 LAB — POCT INR: INR: 1.8

## 2017-06-04 NOTE — Patient Instructions (Signed)
Take 1 tablet today, then resume same dosage 1 tablet every day except 1/2 tablet on Mondays, Wednesdays, and Fridays.  Recheck in 3 weeks. Call us with you have any medication change # [phone] [facility] Main # [phone].

## 2017-06-25 ENCOUNTER — Ambulatory Visit (INDEPENDENT_AMBULATORY_CARE_PROVIDER_SITE_OTHER): Payer: Medicare Other | Admitting: *Deleted

## 2017-06-25 DIAGNOSIS — I480 Paroxysmal atrial fibrillation: Secondary | ICD-10-CM

## 2017-06-25 DIAGNOSIS — I4891 Unspecified atrial fibrillation: Secondary | ICD-10-CM

## 2017-06-25 DIAGNOSIS — Z5181 Encounter for therapeutic drug level monitoring: Secondary | ICD-10-CM

## 2017-06-25 LAB — POCT INR: INR: 2

## 2017-06-25 NOTE — Patient Instructions (Signed)
Description   Continue  same dosage 1 tablet every day except 1/2 tablet on Mondays, Wednesdays, and Fridays.  Recheck in 4 weeks. Call us with you have any medication change # [phone] [facility], Main # [phone].

## 2017-06-27 ENCOUNTER — Telehealth: Payer: Self-pay | Admitting: Family

## 2017-06-27 NOTE — Telephone Encounter (Signed)
Prolia benefits received PA NOT required $185 deductible-$0 met 20% co-insurance    Patient may owe approximately $390 OOP   Letter mailed to inform patient of benefits and to schedule

## 2017-07-08 DIAGNOSIS — H35363 Drusen (degenerative) of macula, bilateral: Secondary | ICD-10-CM | POA: Diagnosis not present

## 2017-07-08 DIAGNOSIS — H524 Presbyopia: Secondary | ICD-10-CM | POA: Diagnosis not present

## 2017-07-08 DIAGNOSIS — H16213 Exposure keratoconjunctivitis, bilateral: Secondary | ICD-10-CM | POA: Diagnosis not present

## 2017-07-08 DIAGNOSIS — H52203 Unspecified astigmatism, bilateral: Secondary | ICD-10-CM | POA: Diagnosis not present

## 2017-07-08 DIAGNOSIS — H353132 Nonexudative age-related macular degeneration, bilateral, intermediate dry stage: Secondary | ICD-10-CM | POA: Diagnosis not present

## 2017-07-08 DIAGNOSIS — H04123 Dry eye syndrome of bilateral lacrimal glands: Secondary | ICD-10-CM | POA: Diagnosis not present

## 2017-07-14 NOTE — Telephone Encounter (Signed)
CRM created for Prolia.

## 2017-07-22 DIAGNOSIS — N3281 Overactive bladder: Secondary | ICD-10-CM | POA: Diagnosis not present

## 2017-07-22 DIAGNOSIS — R351 Nocturia: Secondary | ICD-10-CM | POA: Diagnosis not present

## 2017-07-22 MED FILL — DESMOPRESSIN ACETATE 0.2 MG: 0.2 | 30 days supply | Qty: 60 | Fill #0

## 2017-07-23 ENCOUNTER — Ambulatory Visit (INDEPENDENT_AMBULATORY_CARE_PROVIDER_SITE_OTHER): Payer: Medicare Other | Admitting: Pharmacist

## 2017-07-23 DIAGNOSIS — Z5181 Encounter for therapeutic drug level monitoring: Secondary | ICD-10-CM | POA: Diagnosis not present

## 2017-07-23 DIAGNOSIS — I480 Paroxysmal atrial fibrillation: Secondary | ICD-10-CM

## 2017-07-23 DIAGNOSIS — I4891 Unspecified atrial fibrillation: Secondary | ICD-10-CM

## 2017-07-23 LAB — POCT INR: INR: 2

## 2017-07-23 NOTE — Patient Instructions (Signed)
Description   Continue same dosage 1 tablet every day except 1/2 tablet on Mondays, Wednesdays, and Fridays.  Recheck in 4 weeks. Call us with you have any medication change # [phone] [facility], Main # [phone].

## 2017-08-08 ENCOUNTER — Other Ambulatory Visit: Payer: Self-pay | Admitting: Cardiology

## 2017-08-08 MED FILL — WARFARIN SODIUM 5 MG TABLET: 5 | 90 days supply | Qty: 90 | Fill #0

## 2017-08-18 ENCOUNTER — Other Ambulatory Visit: Payer: Self-pay | Admitting: Cardiology

## 2017-08-18 MED FILL — dilTIAZem HCL 120 MG TABS: 120 | 90 days supply | Qty: 90 | Fill #0

## 2017-08-20 ENCOUNTER — Ambulatory Visit (INDEPENDENT_AMBULATORY_CARE_PROVIDER_SITE_OTHER): Payer: Medicare Other | Admitting: *Deleted

## 2017-08-20 DIAGNOSIS — I4891 Unspecified atrial fibrillation: Secondary | ICD-10-CM | POA: Diagnosis not present

## 2017-08-20 DIAGNOSIS — I480 Paroxysmal atrial fibrillation: Secondary | ICD-10-CM | POA: Diagnosis not present

## 2017-08-20 DIAGNOSIS — Z5181 Encounter for therapeutic drug level monitoring: Secondary | ICD-10-CM | POA: Diagnosis not present

## 2017-08-20 LAB — POCT INR: INR: 1.9

## 2017-08-20 NOTE — Patient Instructions (Signed)
Description   Today March 13th take 1 tablet then continue same dosage 1 tablet every day except 1/2 tablet on Mondays, Wednesdays, and Fridays.  Recheck in 4 weeks. Call us with  any new medication change # [phone] [facility], Main # [phone].

## 2017-08-25 DIAGNOSIS — N3944 Nocturnal enuresis: Secondary | ICD-10-CM | POA: Diagnosis not present

## 2017-08-25 DIAGNOSIS — N3281 Overactive bladder: Secondary | ICD-10-CM | POA: Diagnosis not present

## 2017-08-25 MED FILL — DESMOPRESSIN ACETATE 0.2 MG: 0.2 | 30 days supply | Qty: 60 | Fill #0

## 2017-08-28 MED FILL — SULFAMETHOXAZOLE-TMP DS TAB: 800-160 | 3 days supply | Qty: 6 | Fill #0

## 2017-10-02 MED FILL — DESMOPRESSIN ACETATE 0.2 MG: 0.2 | 30 days supply | Qty: 60 | Fill #1

## 2017-10-07 NOTE — Progress Notes (Signed)
Subjective:   Carla Curtis is a 84 y.o. female who presents for Medicare Annual (Subsequent) preventive examination.  Review of Systems: No ROS.  Medicare Wellness Visit. Additional risk factors are reflected in the social history. Cardiac Risk Factors include: advanced age (>39men, >3 women);hypertension;sedentary lifestyle Sleep patterns: Sleeps 7-8 hrs. Wakes once to urinate.  Home Safety/Smoke Alarms: Feels safe in home. Smoke alarms in place.  Living environment; residence and Firearm Safety: Lives with daughter and son-n-law in 1 story. Seat Belt Safety/Bike Helmet: Wears seat belt.   Female:       Mammo-  Declines    Dexa scan-utd            Objective:     Vitals: BP 124/60 (BP Location: Left Arm, Patient Position: Sitting, Cuff Size: Normal)   Pulse 68   Ht 5\' 6"  (1.676 m)   Wt 170 lb 6.4 oz (77.3 kg)   SpO2 98%   BMI 27.50 kg/m   Body mass index is 27.5 kg/m.  Advanced Directives 10/09/2017 03/07/2016 07/02/2015 01/10/2015  Does Patient Have a Medical Advance Directive? Yes No Yes Yes  Type of Paramedic of [place];Living will - - [place];Living will  Does patient want to make changes to medical advance directive? No - Patient declined - - -  Copy of [place] in Chart? No - copy requested - - No - copy requested  Would patient like information on creating a medical advance directive? - No - patient declined information - -    Tobacco Social History   Tobacco Use  Smoking Status Passive Smoke Exposure - Never Smoker  Smokeless Tobacco Never Used  Tobacco Comment   Lived with smokers      Counseling given: Not Answered Comment: Lived with smokers    Clinical Intake: Pain : No/denies pain    Past Medical History:  Diagnosis Date  . Atrial fibrillation ([place])    on Coumadin since mid 2010  . Incontinence   . Macular degeneration   . Normal cardiac stress test 01/27/09   lvef 73% done at  [facility] (nuclear stress)  . Squamous cell carcinoma 10/14   left side of nose  . Vitamin D deficiency 12/13/2011   Past Surgical History:  Procedure Laterality Date  . ABDOMINAL HYSTERECTOMY  2007  . ARM SKIN LESION BIOPSY / EXCISION     pre-cancerous  . Bilateral mammoplasty    . LAMINECTOMY  1989 and 1990   L4, L5  . TONSILLECTOMY AND ADENOIDECTOMY  1941   Family History  Problem Relation Age of Onset  . Hypertension Mother   . COPD Father        smoker  . Cancer Father        lung  . Cancer Sister        breast  . Bell's palsy Sister   . Heart disease Sister        CHF  . Cancer Daughter        breast/ stage 1- s/p lumpectomy and radiation  . Cancer Other        Hodgkins Disease  . Cancer Daughter    Social History   Socioeconomic History  . Marital status: Widowed    Spouse name: Not on file  . Number of children: 2  . Years of education: Not on file  . Highest education level: Not on file  Occupational History    Employer: RETIRED  Social Needs  .  Financial resource strain: Not on file  . Food insecurity:    Worry: Not on file    Inability: Not on file  . Transportation needs:    Medical: Not on file    Non-medical: Not on file  Tobacco Use  . Smoking status: Passive Smoke Exposure - Never Smoker  . Smokeless tobacco: Never Used  . Tobacco comment: Lived with smokers   Substance and Sexual Activity  . Alcohol use: Yes    Alcohol/week: 0.0 oz    Comment: rare- 1 drink/ month  . Drug use: No  . Sexual activity: Never  Lifestyle  . Physical activity:    Days per week: Not on file    Minutes per session: Not on file  . Stress: Not on file  Relationships  . Social connections:    Talks on phone: Not on file    Gets together: Not on file    Attends religious service: Not on file    Active member of club or organization: Not on file    Attends meetings of clubs or organizations: Not on file    Relationship status: Not on file  Other Topics Concern    . Not on file  Social History Narrative   Regular Exercise- no    Outpatient Encounter Medications as of 10/09/2017  Medication Sig  . diltiazem (CARDIZEM) 120 MG tablet TAKE 1 TABLET (120 MG TOTAL) BY MOUTH DAILY.  . Multiple Vitamins-Minerals (ICAPS AREDS 2 PO) Take by mouth daily.  . Psyllium (METAMUCIL FIBER PO) Take daily as needed by mouth.   . warfarin (COUMADIN) 5 MG tablet TAKE 1/2 TO 1 TABLET BY MOUTH DAILY AS DIRECTED BY COUMADIN CLINIC  . Cyanocobalamin (VITAMIN B 12 PO) Take 1 lozenge by mouth daily. 1061mcg daily  . desmopressin (DDAVP) 0.2 MG tablet Take 0.2 mg by mouth at bedtime.  . furosemide (LASIX) 20 MG tablet Take 1 tablet daily by mouth.   No facility-administered encounter medications on file as of 10/09/2017.     Activities of Daily Living In your present state of health, do you have any difficulty performing the following activities: 10/09/2017  Hearing? N  Vision? N  Comment wears glasses.   Difficulty concentrating or making decisions? N  Walking or climbing stairs? N  Dressing or bathing? N  Doing errands, shopping? N  Preparing Food and eating ? N  Using the Toilet? N  In the past six months, have you accidently leaked urine? N  Do you have problems with loss of bowel control? N  Managing your Medications? N  Managing your Finances? N  Housekeeping or managing your Housekeeping? N  Some recent data might be hidden    Patient Care Team: Debbrah Alar, NP as PCP - General Christy Sartorius, MD as Consulting Physician (Urology) Linward Natal, MD as Referring Physician (Ophthalmology) Lelon Perla, MD as Consulting Physician (Cardiology)    Assessment:   This is a routine wellness examination for Carla Curtis. Physical assessment deferred to PCP.  Exercise Activities and Dietary recommendations Current Exercise Habits: The patient does not participate in regular exercise at present, Exercise limited by: None identified Diet (meal  preparation, eat out, water intake, caffeinated beverages, dairy products, fruits and vegetables): well balanced, on average, 3 meals per day      Goals    . Increase physical activity     Achieve goal by walking in neighborhood or try Walk Away [employer] by Raj Janus  Fall Risk Fall Risk  10/09/2017 04/28/2017 03/06/2016 03/06/2016 01/10/2015  Falls in the past year? No No Yes No Yes  Number falls in past yr: - - 1 - 1  Comment - - tripped over the curb. Feels balance is a little off and has beginnings of macular degeneration - -  Injury with Fall? - - No - No  Follow up - - - - Education provided    Depression Screen PHQ 2/9 Scores 10/09/2017 04/28/2017 03/06/2016 01/10/2015  PHQ - 2 Score 0 0 1 0  PHQ- 9 Score - 2 - -     Cognitive Function MMSE - Mini Mental State Exam 01/10/2015  Orientation to time 5  Orientation to Place 5  Registration 3  Attention/ Calculation 5  Recall 3  Language- name 2 objects 2  Language- repeat 1  Language- follow 3 step command 3  Language- read & follow direction 1  Write a sentence 1  Copy design 1  Total score 30        Immunization History  Administered Date(s) Administered  . Hep A / Hep B 11/19/2013, 11/26/2013, 12/08/2013  . Influenza Split 05/10/2012  . Influenza, High Dose Seasonal PF 06/23/2013, 03/06/2016, 04/28/2017  . Influenza,inj,Quad PF,6+ Mos 01/31/2014  . Pneumococcal Conjugate-13 07/20/2013  . Pneumococcal Polysaccharide-23 06/12/2009  . Td 02/06/2007  . Zoster 06/19/2009    Screening Tests Health Maintenance  Topic Date Due  . URINE MICROALBUMIN  08/02/1942  . HEMOGLOBIN A1C  01/17/2014  . FOOT EXAM  01/10/2016  . TETANUS/TDAP  02/05/2017  . OPHTHALMOLOGY EXAM  12/26/2017  . INFLUENZA VACCINE  01/08/2018  . DEXA SCAN  Completed  . PNA vac Low Risk Adult  Completed      Plan:   Schedule appointment to follow up with [facility] NP.  Eat heart healthy diet (full of fruits, vegetables, whole grains,  lean protein, water--limit salt, fat, and sugar intake) and increase physical activity as tolerated.  Continue doing brain stimulating activities (puzzles, reading, adult coloring books, staying active) to keep memory sharp.   Bring a copy of your living will and/or healthcare power of attorney to your next office visit.   I have personally reviewed and noted the following in the patient's chart:   . Medical and social history . Use of alcohol, tobacco or illicit drugs  . Current medications and supplements . Functional ability and status . Nutritional status . Physical activity . Advanced directives . List of other physicians . Hospitalizations, surgeries, and ER visits in previous 12 months . Vitals . Screenings to include cognitive, depression, and falls . Referrals and appointments  In addition, I have reviewed and discussed with patient certain preventive protocols, quality metrics, and best practice recommendations. A written personalized care plan for preventive services as well as general preventive health recommendations were provided to patient.     Shela Nevin, [place]  10/09/2017

## 2017-10-09 ENCOUNTER — Ambulatory Visit (INDEPENDENT_AMBULATORY_CARE_PROVIDER_SITE_OTHER): Payer: Medicare Other | Admitting: *Deleted

## 2017-10-09 ENCOUNTER — Encounter: Payer: Self-pay | Admitting: *Deleted

## 2017-10-09 VITALS — BP 124/60 | HR 68 | Ht 66.0 in | Wt 170.4 lb

## 2017-10-09 DIAGNOSIS — Z0000 Encounter for general adult medical examination without abnormal findings: Secondary | ICD-10-CM

## 2017-10-09 NOTE — Progress Notes (Signed)
Reviewed  Doc Mandala R Lowne Chase, DO  

## 2017-10-09 NOTE — Patient Instructions (Addendum)
Schedule appointment to follow up with [facility] NP.  Eat heart healthy diet (full of fruits, vegetables, whole grains, lean protein, water--limit salt, fat, and sugar intake) and increase physical activity as tolerated.  Continue doing brain stimulating activities (puzzles, reading, adult coloring books, staying active) to keep memory sharp.   Bring a copy of your living will and/or healthcare power of attorney to your next office visit.   Carla Curtis , Thank you for taking time to come for your Medicare Wellness Visit. I appreciate your ongoing commitment to your health goals. Please review the following plan we discussed and let me know if I can assist you in the future.   These are the goals we discussed: Goals    . Increase physical activity     Achieve goal by walking in neighborhood or try Walk [employer] by Raj Janus        This is a list of the screening recommended for you and due dates:  Health Maintenance  Topic Date Due  . Urine Protein Check  08/02/1942  . Hemoglobin A1C  01/17/2014  . Complete foot exam   01/10/2016  . Tetanus Vaccine  02/05/2017  . Eye exam for diabetics  12/26/2017  . Flu Shot  01/08/2018  . DEXA scan (bone density measurement)  Completed  . Pneumonia vaccines  Completed    Health Maintenance for Postmenopausal Women Menopause is a normal process in which your reproductive ability comes to an end. This process happens gradually over a span of months to years, usually between the ages of 28 and 9. Menopause is complete when you have missed 12 consecutive menstrual periods. It is important to talk with your health care provider about some of the most common conditions that affect postmenopausal women, such as heart disease, cancer, and bone loss (osteoporosis). Adopting a healthy lifestyle and getting preventive care can help to promote your health and wellness. Those actions can also lower your chances of developing some of these common  conditions. What should I know about menopause? During menopause, you may experience a number of symptoms, such as:  Moderate-to-severe hot flashes.  Night sweats.  Decrease in sex drive.  Mood swings.  Headaches.  Tiredness.  Irritability.  Memory problems.  Insomnia.  Choosing to treat or not to treat menopausal changes is an individual decision that you make with your health care provider. What should I know about hormone replacement therapy and supplements? Hormone therapy products are effective for treating symptoms that are associated with menopause, such as hot flashes and night sweats. Hormone replacement carries certain risks, especially as you become older. If you are thinking about using estrogen or estrogen with progestin treatments, discuss the benefits and risks with your health care provider. What should I know about heart disease and stroke? Heart disease, heart attack, and stroke become more likely as you age. This may be due, in part, to the hormonal changes that your body experiences during menopause. These can affect how your body processes dietary fats, triglycerides, and cholesterol. Heart attack and stroke are both medical emergencies. There are many things that you can do to help prevent heart disease and stroke:  Have your blood pressure checked at least every 1-2 years. High blood pressure causes heart disease and increases the risk of stroke.  If you are 43-85 years old, ask your health care provider if you should take aspirin to prevent a heart attack or a stroke.  Do not use any tobacco products, including  cigarettes, chewing tobacco, or electronic cigarettes. If you need help quitting, ask your health care provider.  It is important to eat a healthy diet and maintain a healthy weight. ? Be sure to include plenty of vegetables, fruits, low-fat dairy products, and lean protein. ? Avoid eating foods that are high in solid fats, added sugars, or salt  (sodium).  Get regular exercise. This is one of the most important things that you can do for your health. ? Try to exercise for at least 150 minutes each week. The type of exercise that you do should increase your heart rate and make you sweat. This is known as moderate-intensity exercise. ? Try to do strengthening exercises at least twice each week. Do these in addition to the moderate-intensity exercise.  Know your numbers.Ask your health care provider to check your cholesterol and your blood glucose. Continue to have your blood tested as directed by your health care provider.  What should I know about cancer screening? There are several types of cancer. Take the following steps to reduce your risk and to catch any cancer development as early as possible. Breast Cancer  Practice breast self-awareness. ? This means understanding how your breasts normally appear and feel. ? It also means doing regular breast self-exams. Let your health care provider know about any changes, no matter how small.  If you are 35 or older, have a clinician do a breast exam (clinical breast exam or CBE) every year. Depending on your age, family history, and medical history, it may be recommended that you also have a yearly breast X-ray (mammogram).  If you have a family history of breast cancer, talk with your health care provider about genetic screening.  If you are at high risk for breast cancer, talk with your health care provider about having an MRI and a mammogram every year.  Breast cancer (BRCA) gene test is recommended for women who have family members with BRCA-related cancers. Results of the assessment will determine the need for genetic counseling and BRCA1 and for BRCA2 testing. BRCA-related cancers include these types: ? Breast. This occurs in males or females. ? Ovarian. ? Tubal. This may also be called fallopian tube cancer. ? Cancer of the abdominal or pelvic lining (peritoneal  cancer). ? Prostate. ? Pancreatic.  Cervical, Uterine, and Ovarian Cancer Your health care provider may recommend that you be screened regularly for cancer of the pelvic organs. These include your ovaries, uterus, and vagina. This screening involves a pelvic exam, which includes checking for microscopic changes to the surface of your cervix (Pap test).  For women ages 21-65, health care providers may recommend a pelvic exam and a Pap test every three years. For women ages 68-65, they may recommend the Pap test and pelvic exam, combined with testing for human papilloma virus (HPV), every five years. Some types of HPV increase your risk of cervical cancer. Testing for HPV may also be done on women of any age who have unclear Pap test results.  Other health care providers may not recommend any screening for nonpregnant women who are considered low risk for pelvic cancer and have no symptoms. Ask your health care provider if a screening pelvic exam is right for you.  If you have had past treatment for cervical cancer or a condition that could lead to cancer, you need Pap tests and screening for cancer for at least 20 years after your treatment. If Pap tests have been discontinued for you, your risk factors (such as  having a new sexual partner) need to be reassessed to determine if you should start having screenings again. Some women have medical problems that increase the chance of getting cervical cancer. In these cases, your health care provider may recommend that you have screening and Pap tests more often.  If you have a family history of uterine cancer or ovarian cancer, talk with your health care provider about genetic screening.  If you have vaginal bleeding after reaching menopause, tell your health care provider.  There are currently no reliable tests available to screen for ovarian cancer.  Lung Cancer Lung cancer screening is recommended for adults 65-83 years old who are at high risk for  lung cancer because of a history of smoking. A yearly low-dose CT scan of the lungs is recommended if you:  Currently smoke.  Have a history of at least 30 pack-years of smoking and you currently smoke or have quit within the past 15 years. A pack-year is smoking an average of one pack of cigarettes per day for one year.  Yearly screening should:  Continue until it has been 15 years since you quit.  Stop if you develop a health problem that would prevent you from having lung cancer treatment.  Colorectal Cancer  This type of cancer can be detected and can often be prevented.  Routine colorectal cancer screening usually begins at age 90 and continues through age 71.  If you have risk factors for colon cancer, your health care provider may recommend that you be screened at an earlier age.  If you have a family history of colorectal cancer, talk with your health care provider about genetic screening.  Your health care provider may also recommend using home test kits to check for hidden blood in your stool.  A small camera at the end of a tube can be used to examine your colon directly (sigmoidoscopy or colonoscopy). This is done to check for the earliest forms of colorectal cancer.  Direct examination of the colon should be repeated every 5-10 years until age 86. However, if early forms of precancerous polyps or small growths are found or if you have a family history or genetic risk for colorectal cancer, you may need to be screened more often.  Skin Cancer  Check your skin from head to toe regularly.  Monitor any moles. Be sure to tell your health care provider: ? About any new moles or changes in moles, especially if there is a change in a mole's shape or color. ? If you have a mole that is larger than the size of a pencil eraser.  If any of your family members has a history of skin cancer, especially at a young age, talk with your health care provider about genetic  screening.  Always use sunscreen. Apply sunscreen liberally and repeatedly throughout the day.  Whenever you are outside, protect yourself by wearing long sleeves, pants, a wide-brimmed hat, and sunglasses.  What should I know about osteoporosis? Osteoporosis is a condition in which bone destruction happens more quickly than new bone creation. After menopause, you may be at an increased risk for osteoporosis. To help prevent osteoporosis or the bone fractures that can happen because of osteoporosis, the following is recommended:  If you are 18-8 years old, get at least 1,000 mg of calcium and at least 600 mg of vitamin D per day.  If you are older than age 61 but younger than age 49, get at least 1,200 mg of calcium and  at least 600 mg of vitamin D per day.  If you are older than age 27, get at least 1,200 mg of calcium and at least 800 mg of vitamin D per day.  Smoking and excessive alcohol intake increase the risk of osteoporosis. Eat foods that are rich in calcium and vitamin D, and do weight-bearing exercises several times each week as directed by your health care provider. What should I know about how menopause affects my mental health? Depression may occur at any age, but it is more common as you become older. Common symptoms of depression include:  Low or sad mood.  Changes in sleep patterns.  Changes in appetite or eating patterns.  Feeling an overall lack of motivation or enjoyment of activities that you previously enjoyed.  Frequent crying spells.  Talk with your health care provider if you think that you are experiencing depression. What should I know about immunizations? It is important that you get and maintain your immunizations. These include:  Tetanus, diphtheria, and pertussis (Tdap) booster vaccine.  Influenza every year before the flu season begins.  Pneumonia vaccine.  Shingles vaccine.  Your health care provider may also recommend other  immunizations. This information is not intended to replace advice given to you by your health care provider. Make sure you discuss any questions you have with your health care provider. Document Released: 07/19/2005 Document Revised: 12/15/2015 Document Reviewed: 02/28/2015 Elsevier Interactive Patient Education  2018 [employer].

## 2017-10-27 ENCOUNTER — Ambulatory Visit (INDEPENDENT_AMBULATORY_CARE_PROVIDER_SITE_OTHER): Payer: Medicare Other | Admitting: *Deleted

## 2017-10-27 DIAGNOSIS — I4891 Unspecified atrial fibrillation: Secondary | ICD-10-CM | POA: Diagnosis not present

## 2017-10-27 DIAGNOSIS — I480 Paroxysmal atrial fibrillation: Secondary | ICD-10-CM

## 2017-10-27 DIAGNOSIS — Z5181 Encounter for therapeutic drug level monitoring: Secondary | ICD-10-CM

## 2017-10-27 LAB — POCT INR: INR: 2.4 (ref 2.0–3.0)

## 2017-10-27 NOTE — Patient Instructions (Signed)
Description   Continue same dosage 1 tablet every day except 1/2 tablet on Mondays, Wednesdays, and Fridays.  Recheck in 5 weeks. Call us with  any new medication change # [phone] [facility] # [phone].

## 2017-11-05 MED FILL — DESMOPRESSIN ACETATE 0.2 MG: 0.2 | 30 days supply | Qty: 60 | Fill #2

## 2017-11-18 DIAGNOSIS — R001 Bradycardia, unspecified: Secondary | ICD-10-CM | POA: Diagnosis not present

## 2017-11-18 DIAGNOSIS — Z888 Allergy status to other drugs, medicaments and biological substances status: Secondary | ICD-10-CM | POA: Diagnosis not present

## 2017-11-18 DIAGNOSIS — E7849 Other hyperlipidemia: Secondary | ICD-10-CM | POA: Diagnosis not present

## 2017-11-18 DIAGNOSIS — Z7901 Long term (current) use of anticoagulants: Secondary | ICD-10-CM | POA: Diagnosis not present

## 2017-11-18 DIAGNOSIS — I480 Paroxysmal atrial fibrillation: Secondary | ICD-10-CM | POA: Diagnosis not present

## 2017-11-18 DIAGNOSIS — R011 Cardiac murmur, unspecified: Secondary | ICD-10-CM | POA: Diagnosis not present

## 2017-11-19 MED FILL — dilTIAZem HCL 120 MG TABS: 120 | 90 days supply | Qty: 90 | Fill #1

## 2017-11-26 DIAGNOSIS — N3281 Overactive bladder: Secondary | ICD-10-CM | POA: Diagnosis not present

## 2017-11-26 DIAGNOSIS — I499 Cardiac arrhythmia, unspecified: Secondary | ICD-10-CM | POA: Diagnosis not present

## 2017-11-26 DIAGNOSIS — I341 Nonrheumatic mitral (valve) prolapse: Secondary | ICD-10-CM | POA: Diagnosis not present

## 2017-11-26 DIAGNOSIS — Z79899 Other long term (current) drug therapy: Secondary | ICD-10-CM | POA: Diagnosis not present

## 2017-11-26 DIAGNOSIS — R351 Nocturia: Secondary | ICD-10-CM | POA: Diagnosis not present

## 2017-11-26 DIAGNOSIS — N390 Urinary tract infection, site not specified: Secondary | ICD-10-CM | POA: Diagnosis not present

## 2017-11-26 DIAGNOSIS — B9620 Unspecified Escherichia coli [E. coli] as the cause of diseases classified elsewhere: Secondary | ICD-10-CM | POA: Diagnosis not present

## 2017-12-01 ENCOUNTER — Ambulatory Visit (INDEPENDENT_AMBULATORY_CARE_PROVIDER_SITE_OTHER): Payer: Medicare Other | Admitting: *Deleted

## 2017-12-01 DIAGNOSIS — I480 Paroxysmal atrial fibrillation: Secondary | ICD-10-CM | POA: Diagnosis not present

## 2017-12-01 DIAGNOSIS — Z5181 Encounter for therapeutic drug level monitoring: Secondary | ICD-10-CM | POA: Diagnosis not present

## 2017-12-01 DIAGNOSIS — I4891 Unspecified atrial fibrillation: Secondary | ICD-10-CM | POA: Diagnosis not present

## 2017-12-01 LAB — POCT INR: INR: 2.6 (ref 2.0–3.0)

## 2017-12-01 MED FILL — WARFARIN SODIUM 5 MG TABLET: 5 | 90 days supply | Qty: 90 | Fill #1

## 2017-12-01 NOTE — Patient Instructions (Signed)
Description   Continue same dosage 1 tablet every day except 1/2 tablet on Mondays, Wednesdays, and Fridays.  Recheck in 6 weeks. Call us with  any new medication change # [phone] [facility], Main # [phone].

## 2017-12-05 MED FILL — DESMOPRESSIN ACETATE 0.2 MG: 0.2 | 30 days supply | Qty: 60 | Fill #3

## 2017-12-08 DIAGNOSIS — I480 Paroxysmal atrial fibrillation: Secondary | ICD-10-CM | POA: Diagnosis not present

## 2017-12-24 DIAGNOSIS — H353121 Nonexudative age-related macular degeneration, left eye, early dry stage: Secondary | ICD-10-CM | POA: Diagnosis not present

## 2017-12-24 DIAGNOSIS — H353210 Exudative age-related macular degeneration, right eye, stage unspecified: Secondary | ICD-10-CM | POA: Diagnosis not present

## 2017-12-31 DIAGNOSIS — H353132 Nonexudative age-related macular degeneration, bilateral, intermediate dry stage: Secondary | ICD-10-CM | POA: Diagnosis not present

## 2018-01-05 MED FILL — DESMOPRESSIN ACETATE 0.2 MG: 0.2 | 37 days supply | Qty: 60 | Fill #0

## 2018-01-22 DIAGNOSIS — D485 Neoplasm of uncertain behavior of skin: Secondary | ICD-10-CM | POA: Diagnosis not present

## 2018-01-22 DIAGNOSIS — L821 Other seborrheic keratosis: Secondary | ICD-10-CM | POA: Diagnosis not present

## 2018-01-22 DIAGNOSIS — D1801 Hemangioma of skin and subcutaneous tissue: Secondary | ICD-10-CM | POA: Diagnosis not present

## 2018-01-22 DIAGNOSIS — L570 Actinic keratosis: Secondary | ICD-10-CM | POA: Diagnosis not present

## 2018-01-22 DIAGNOSIS — C44622 Squamous cell carcinoma of skin of right upper limb, including shoulder: Secondary | ICD-10-CM | POA: Diagnosis not present

## 2018-01-22 MED FILL — FLUOROURACIL 5% CREAM: 5 | 21 days supply | Qty: 40 | Fill #0

## 2018-01-25 ENCOUNTER — Emergency Department (HOSPITAL_BASED_OUTPATIENT_CLINIC_OR_DEPARTMENT_OTHER)
Admission: EM | Admit: 2018-01-25 | Discharge: 2018-01-25 | Disposition: A | Discharge status: Home / Self Care | Payer: Medicare Other | Attending: Emergency Medicine | Admitting: Emergency Medicine

## 2018-01-25 ENCOUNTER — Encounter (HOSPITAL_BASED_OUTPATIENT_CLINIC_OR_DEPARTMENT_OTHER): Payer: Self-pay | Admitting: Emergency Medicine

## 2018-01-25 ENCOUNTER — Other Ambulatory Visit: Payer: Self-pay

## 2018-01-25 ENCOUNTER — Emergency Department (HOSPITAL_BASED_OUTPATIENT_CLINIC_OR_DEPARTMENT_OTHER): Payer: Medicare Other

## 2018-01-25 DIAGNOSIS — I110 Hypertensive heart disease with heart failure: Secondary | ICD-10-CM | POA: Insufficient documentation

## 2018-01-25 DIAGNOSIS — Z79899 Other long term (current) drug therapy: Secondary | ICD-10-CM | POA: Insufficient documentation

## 2018-01-25 DIAGNOSIS — N390 Urinary tract infection, site not specified: Secondary | ICD-10-CM | POA: Diagnosis not present

## 2018-01-25 DIAGNOSIS — Z7722 Contact with and (suspected) exposure to environmental tobacco smoke (acute) (chronic): Secondary | ICD-10-CM | POA: Diagnosis not present

## 2018-01-25 DIAGNOSIS — R0602 Shortness of breath: Secondary | ICD-10-CM | POA: Diagnosis not present

## 2018-01-25 DIAGNOSIS — I509 Heart failure, unspecified: Secondary | ICD-10-CM | POA: Diagnosis not present

## 2018-01-25 DIAGNOSIS — Z85828 Personal history of other malignant neoplasm of skin: Secondary | ICD-10-CM | POA: Diagnosis not present

## 2018-01-25 DIAGNOSIS — Z7901 Long term (current) use of anticoagulants: Secondary | ICD-10-CM | POA: Diagnosis not present

## 2018-01-25 DIAGNOSIS — R509 Fever, unspecified: Secondary | ICD-10-CM | POA: Diagnosis not present

## 2018-01-25 DIAGNOSIS — M549 Dorsalgia, unspecified: Secondary | ICD-10-CM | POA: Diagnosis not present

## 2018-01-25 LAB — COMPREHENSIVE METABOLIC PANEL
ALBUMIN: 4.1 g/dL (ref 3.5–5.0)
ALK PHOS: 80 U/L (ref 38–126)
ALT: 9 U/L (ref 0–44)
AST: 23 U/L (ref 15–41)
Anion gap: 11 (ref 5–15)
BILIRUBIN TOTAL: 1.1 mg/dL (ref 0.3–1.2)
BUN: 10 mg/dL (ref 8–23)
CO2: 25 mmol/L (ref 22–32)
Calcium: 9.3 mg/dL (ref 8.9–10.3)
Chloride: 103 mmol/L (ref 98–111)
Creatinine, Ser: 0.61 mg/dL (ref 0.44–1.00)
GFR calc Af Amer: >60 mL/min (ref >60)
GFR calc non Af Amer: >60 mL/min (ref >60)
GLUCOSE: 145 mg/dL — AB (ref 70–99)
POTASSIUM: 3.6 mmol/L (ref 3.5–5.1)
SODIUM: 139 mmol/L (ref 135–145)
TOTAL PROTEIN: 7.1 g/dL (ref 6.5–8.1)

## 2018-01-25 LAB — PROTIME-INR
INR: 1
Prothrombin Time: 13.1 seconds (ref 11.4–15.2)

## 2018-01-25 LAB — URINALYSIS, ROUTINE W REFLEX MICROSCOPIC
BILIRUBIN URINE: NEGATIVE
Glucose, UA: NEGATIVE mg/dL
KETONES UR: 15 mg/dL — AB
NITRITE: POSITIVE — AB
PH: 7 (ref 5.0–8.0)
Protein, ur: NEGATIVE mg/dL
SPECIFIC GRAVITY, URINE: 1.01 (ref 1.005–1.030)

## 2018-01-25 LAB — URINALYSIS, MICROSCOPIC (REFLEX)

## 2018-01-25 LAB — CBC WITH DIFFERENTIAL/PLATELET
BASOS PCT: 0 %
Basophils Absolute: 0 10*3/uL (ref 0.0–0.1)
Eosinophils Absolute: 0 10*3/uL (ref 0.0–0.7)
Eosinophils Relative: 0 %
HEMATOCRIT: 41.1 % (ref 36.0–46.0)
HEMOGLOBIN: 14.3 g/dL (ref 12.0–15.0)
LYMPHS ABS: 0.5 10*3/uL — AB (ref 0.7–4.0)
Lymphocytes Relative: 3 %
MCH: 31.1 pg (ref 26.0–34.0)
MCHC: 34.8 g/dL (ref 30.0–36.0)
MCV: 89.3 fL (ref 78.0–100.0)
MONO ABS: 1 10*3/uL (ref 0.1–1.0)
MONOS PCT: 7 %
NEUTROS ABS: 13.4 10*3/uL — AB (ref 1.7–7.7)
NEUTROS PCT: 90 %
Platelets: 242 10*3/uL (ref 150–400)
RBC: 4.6 MIL/uL (ref 3.87–5.11)
RDW: 14.9 % (ref 11.5–15.5)
WBC: 14.9 10*3/uL — ABNORMAL HIGH (ref 4.0–10.5)

## 2018-01-25 LAB — I-STAT CG4 LACTIC ACID, ED: Lactic Acid, Venous: 1 mmol/L (ref 0.5–1.9)

## 2018-01-25 MED ORDER — CEPHALEXIN 500 MG PO CAPS: 500.0000 mg | ORAL_CAPSULE | Freq: Three times a day (TID) | ORAL | 0 refills | Status: DC

## 2018-01-25 MED ORDER — SODIUM CHLORIDE 0.9 % IV SOLN: Freq: Once | INTRAVENOUS | Status: DC

## 2018-01-25 MED ORDER — SODIUM CHLORIDE 0.9 % IV SOLN
1.0000 g | Freq: Once | INTRAVENOUS | Status: AC
  Administered 2018-01-25: 1 g via INTRAVENOUS
  Filled 2018-01-25: qty 10

## 2018-01-25 MED ORDER — ACETAMINOPHEN 325 MG PO TABS
650.0000 mg | ORAL_TABLET | ORAL | Status: DC | PRN
  Administered 2018-01-25: 650 mg via ORAL
  Filled 2018-01-25: qty 2

## 2018-01-25 MED ORDER — SODIUM CHLORIDE 0.9 % IV BOLUS: 500.0000 mL | Freq: Once | INTRAVENOUS | Status: DC

## 2018-01-25 NOTE — ED Provider Notes (Signed)
[facility] Provider Note   CSN: [id number] Arrival date & time: 01/25/18  1036     History   Chief Complaint Chief Complaint  Patient presents with  . Fever  . Leg Pain    HPI Carla Curtis is a 84 y.o. female.  HPI Patient presents with shaking chills and low-grade fever this morning upon waking.  Associated with nausea and "dry heaving".  Patient had some mild shortness of breath but no cough.  Has had several days of lower extremity cramping.  Denies hematuria, dysuria or increased frequency.  Denies abdominal pain but has been constipated.   Past Medical History:  Diagnosis Date  . Atrial fibrillation ([place])    on Coumadin since mid 2010  . Incontinence   . Macular degeneration   . Normal cardiac stress test 01/27/09   lvef 73% done at [facility] (nuclear stress)  . Squamous cell carcinoma 10/14   left side of nose  . Vitamin D deficiency 12/13/2011    Patient Active Problem List   Diagnosis Date Noted  . Right ankle injury 12/13/2014  . Chest pain 02/21/2014  . Dyspnea on exertion 02/21/2014  . Atypical chest pain 01/31/2014  . Dizziness 10/07/2013  . Vision problem 10/07/2013  . Fasting hyperglycemia 07/20/2013  . Encounter for therapeutic drug monitoring 07/05/2013  . Grief reaction 06/16/2012  . Vitamin D deficiency 12/13/2011  . Skin lesion 10/08/2010  . Cystocele 10/03/2010  . Mitral valve prolapse 10/03/2010  . CHF (congestive heart failure) ([place]) 10/03/2010  . Long term current use of anticoagulant 07/11/2010  . POLYP, COLON 02/19/2010  . Osteoporosis 11/13/2009  . Essential hypertension 10/10/2009  . FIBRILLATION, ATRIAL 10/10/2009  . INCONTINENCE 10/10/2009  . CHEST PAIN, HX OF 10/10/2009    Past Surgical History:  Procedure Laterality Date  . ABDOMINAL HYSTERECTOMY  2007  . ARM SKIN LESION BIOPSY / EXCISION     pre-cancerous  . Bilateral mammoplasty    . LAMINECTOMY  1989 and 1990   L4, L5  . TONSILLECTOMY  AND ADENOIDECTOMY  1941     OB History   None      Home Medications    Prior to Admission medications   Medication Sig Start Date End Date Taking? Authorizing Provider  cephALEXin (KEFLEX) 500 MG capsule Take 1 capsule (500 mg total) by mouth 3 (three) times daily. 01/26/18   Julianne Rice, MD  Cyanocobalamin (VITAMIN B 12 PO) Take 1 lozenge by mouth daily. 1037mcg daily    [provider]  desmopressin (DDAVP) 0.2 MG tablet Take 0.2 mg by mouth at bedtime.    [provider]  diltiazem (CARDIZEM) 120 MG tablet TAKE 1 TABLET (120 MG TOTAL) BY MOUTH DAILY. 08/18/17   Lelon Perla, MD  Multiple Vitamins-Minerals (ICAPS AREDS 2 PO) Take by mouth daily.    [provider]  Psyllium (METAMUCIL FIBER PO) Take daily as needed by mouth.     [provider]  warfarin (COUMADIN) 5 MG tablet TAKE 1/2 TO 1 TABLET BY MOUTH DAILY AS DIRECTED BY COUMADIN CLINIC 08/08/17   Lelon Perla, MD    Family History Family History  Problem Relation Age of Onset  . Hypertension Mother   . COPD Father        smoker  . Cancer Father        lung  . Cancer Sister        breast  . Bell's palsy Sister   . Heart disease  Sister        CHF  . Cancer Daughter        breast/ stage 1- s/p lumpectomy and radiation  . Cancer Other        Hodgkins Disease  . Cancer Daughter     Social History Social History   Tobacco Use  . Smoking status: Passive Smoke Exposure - Never Smoker  . Smokeless tobacco: Never Used  . Tobacco comment: Lived with smokers   Substance Use Topics  . Alcohol use: Yes    Alcohol/week: 0.0 standard drinks    Comment: rare- 1 drink/ month  . Drug use: No     Allergies   Fosamax [alendronate sodium]   Review of Systems Review of Systems  Constitutional: Positive for chills, fatigue and fever.  HENT: Negative for congestion, rhinorrhea, sinus pressure, sinus pain, sore throat and trouble swallowing.   Eyes: Negative for visual  disturbance.  Respiratory: Positive for shortness of breath. Negative for cough.   Cardiovascular: Negative for chest pain and palpitations.  Gastrointestinal: Positive for constipation and nausea. Negative for abdominal pain, diarrhea and vomiting.  Genitourinary: Negative for difficulty urinating, dysuria, flank pain and frequency.  Musculoskeletal: Positive for back pain and myalgias. Negative for neck pain and neck stiffness.  Skin: Negative for rash and wound.  Neurological: Negative for dizziness, speech difficulty, weakness, light-headedness and headaches.  All other systems reviewed and are negative.    Physical Exam Updated Vital Signs BP 114/61   Pulse 89   Temp 98.7 F (37.1 C) (Oral)   Resp (!) 23   Ht 5\' 6"  (1.676 m)   Wt 76.2 kg   SpO2 92%   BMI 27.12 kg/m   Physical Exam  Constitutional: She is oriented to person, place, and time. She appears well-developed and well-nourished. No distress.  HENT:  Head: Normocephalic and atraumatic.  Mouth/Throat: Oropharynx is clear and moist. No oropharyngeal exudate.  Eyes: Pupils are equal, round, and reactive to light. EOM are normal.  Neck: Normal range of motion. Neck supple. No JVD present.  No meningismus  Cardiovascular: Normal rate and regular rhythm. Exam reveals no gallop and no friction rub.  No murmur heard. Pulmonary/Chest: Effort normal. She has rales.  Few crackles in bilateral bases.  No respiratory distress.  Abdominal: Soft. Bowel sounds are normal. There is no tenderness. There is no rebound and no guarding.  Musculoskeletal: Normal range of motion. She exhibits no edema or tenderness.  No lower extremity swelling, asymmetry or tenderness.  Distal pulses intact.  No midline thoracic tenderness.  Patient does have some midline lower lumbar tenderness.  No CVA tenderness bilaterally.  Lymphadenopathy:    She has no cervical adenopathy.  Neurological: She is alert and oriented to person, place, and time.    Moving all extremities without focal deficit.  Sensation intact.  Skin: Skin is warm and dry. Capillary refill takes less than 2 seconds. No rash noted. She is not diaphoretic. No erythema.  Psychiatric: She has a normal mood and affect. Her behavior is normal.  Nursing note and vitals reviewed.    ED Treatments / Results  Labs (all labs ordered are listed, but only abnormal results are displayed) Labs Reviewed  URINE CULTURE - Abnormal; Notable for the following components:      Result Value   Culture   (*)    Value: >=100,000 COLONIES/mL ESCHERICHIA COLI SUSCEPTIBILITIES TO FOLLOW Performed at [facility], [address], [place], [place] [address]  All other components within normal limits  COMPREHENSIVE METABOLIC PANEL - Abnormal; Notable for the following components:   Glucose, Bld 145 (*)    All other components within normal limits  CBC WITH DIFFERENTIAL/PLATELET - Abnormal; Notable for the following components:   WBC 14.9 (*)    Neutro Abs 13.4 (*)    Lymphs Abs 0.5 (*)    All other components within normal limits  URINALYSIS, ROUTINE W REFLEX MICROSCOPIC - Abnormal; Notable for the following components:   APPearance HAZY (*)    Hgb urine dipstick TRACE (*)    Ketones, ur 15 (*)    Nitrite POSITIVE (*)    Leukocytes, UA TRACE (*)    All other components within normal limits  URINALYSIS, MICROSCOPIC (REFLEX) - Abnormal; Notable for the following components:   Bacteria, UA MANY (*)    All other components within normal limits  PROTIME-INR  I-STAT CG4 LACTIC ACID, ED    EKG EKG Interpretation  Date/Time:  Sunday January 25 2018 11:39:03 EDT Ventricular Rate:  99 PR Interval:    QRS Duration: 97 QT Interval:  386 QTC Calculation: 496 R Axis:   50 Text Interpretation:  Sinus tachycardia Ventricular premature complex Probable inferior infarct, old When compared with ECG of 03/07/2016, HEART RATE has increased Premature ventricular complexes are now  present Confirmed by Glick, Reem Fleury ([id number]) on 01/26/2018 3:59:44 PM   Radiology No results found.  Procedures Procedures (including critical care time)  Medications Ordered in ED Medications  cefTRIAXone (ROCEPHIN) 1 g in sodium chloride 0.9 % 100 mL IVPB ( Intravenous Stopped 01/25/18 1543)     Initial Impression / Assessment and Plan / ED Course  I have reviewed the triage vital signs and the nursing notes.  Pertinent labs & imaging results that were available during my care of the patient were reviewed by me and considered in my medical decision making (see chart for details).     Patient with episode of UTI on UA.  Urine sent for culture.  Will give dose of Rocephin.  Patient states she is feeling better.  Signed out to oncoming emergency physician pending reevaluation after IV antibiotics.  Final Clinical Impressions(s) / ED Diagnoses   Final diagnoses:  Acute lower UTI    ED Discharge Orders         Ordered    cephALEXin (KEFLEX) 500 MG capsule  3 times daily     08 /18/19 1505           Julianne Rice, MD 01/27/18 [id number]

## 2018-01-25 NOTE — ED Triage Notes (Signed)
Fever, bilateral leg pain x 3 days, chills, nausea.

## 2018-01-27 ENCOUNTER — Telehealth: Payer: Self-pay

## 2018-01-27 NOTE — Telephone Encounter (Signed)
ED follow up call made to patient.Left message for a return call to schedule an appointment.

## 2018-01-28 LAB — URINE CULTURE

## 2018-01-29 ENCOUNTER — Telehealth: Payer: Self-pay | Admitting: *Deleted

## 2018-01-29 NOTE — Telephone Encounter (Signed)
Post ED Visit - Positive Culture Follow-up  Culture report reviewed by antimicrobial stewardship pharmacist:  []  Elenor Quinones, Pharm.D. []  Heide Guile, Pharm.D., BCPS AQ-ID []  Parks Neptune, Pharm.D., BCPS []  Alycia Rossetti, Pharm.D., BCPS []  [place], [place].D., BCPS, AAHIVP []  Legrand Como, Pharm.D., BCPS, AAHIVP []  Salome Arnt, PharmD, BCPS []  Johnnette Gourd, PharmD, BCPS []  Hughes Better, PharmD, BCPS []  Leeroy Cha, PharmD Elicia Lamp, PharmD  Positive urine culture Treated with Cephalexin, organism sensitive to the same and no further patient follow-up is required at this time.  Harlon Flor [facility] 01/29/2018, 10:58 AM

## 2018-02-02 ENCOUNTER — Ambulatory Visit (INDEPENDENT_AMBULATORY_CARE_PROVIDER_SITE_OTHER): Payer: Medicare Other | Admitting: Medical

## 2018-02-02 ENCOUNTER — Encounter: Payer: Self-pay | Admitting: Medical

## 2018-02-02 VITALS — BP 120/55 | HR 60 | Temp 97.6°F | Ht 66.0 in | Wt 162.8 lb

## 2018-02-02 DIAGNOSIS — Z87440 Personal history of urinary (tract) infections: Secondary | ICD-10-CM

## 2018-02-02 DIAGNOSIS — R350 Frequency of micturition: Secondary | ICD-10-CM

## 2018-02-02 LAB — POC URINALSYSI DIPSTICK (AUTOMATED)
Bilirubin, UA: NEGATIVE
Glucose, UA: NEGATIVE
Ketones, UA: NEGATIVE
Leukocytes, UA: NEGATIVE
NITRITE UA: NEGATIVE
PH UA: 6 (ref 5.0–8.0)
PROTEIN UA: NEGATIVE
RBC UA: NEGATIVE
SPEC GRAV UA: 1.02 (ref 1.010–1.025)
UROBILINOGEN UA: NEGATIVE U/dL — AB

## 2018-02-02 NOTE — Patient Instructions (Signed)
For history of uti and history of frequent urination, we will run your UA and get a culture of your urine. Will see if you have persistent infection as you report never clearing uti in the past. If persistent infection may need to reach out to your urologist or pcp as to continue treatment.  Also would ask you call you urologist if you can start desmopressin as ED had advised you to hold the med.  Follow up as regularly scheduled or as needed

## 2018-02-02 NOTE — Progress Notes (Addendum)
Subjective:    Patient ID: Carla Curtis, female    DOB: 17-Nov-1932, 84 y.o.   MRN: [id number]  HPI    Pt in for follow from the ED.  Pt here for UTI. Pt had acute onset of chills, dry heaves and some fast heart at that time. No uti signs and symptoms. Pt was given antibiotic and she responded quickly to treatment.   Pt was given given rocephin in ED. Gave keflex 500 mg tid. She just finished that last night.  Pt has frequent urination at night. Pt was given given DDAVP and told can take 1-2 tabs at night if need by urologist. . Pt states ED told her to hold the DDAVP presently.Told to hold until she discusses use with urologist.  In past pt states she has had problems clearing uti in the past.    Review of Systems  Constitutional: Negative for chills, diaphoresis and fatigue.  Respiratory: Negative for cough, chest tightness, shortness of breath and wheezing.   Cardiovascular: Negative for chest pain and palpitations.  Gastrointestinal: Negative for abdominal pain.  Genitourinary: Negative for dyspareunia and dysuria.  Musculoskeletal:       No swelling recently.  Skin: Positive for rash.  Neurological: Negative for dizziness, syncope, weakness, numbness and headaches.  Hematological: Negative for adenopathy. Does not bruise/bleed easily.  Psychiatric/Behavioral: Negative for behavioral problems and confusion.    Past Medical History:  Diagnosis Date  . Atrial fibrillation ([place])    on Coumadin since mid 2010  . Incontinence   . Macular degeneration   . Normal cardiac stress test 01/27/09   lvef 73% done at [facility] (nuclear stress)  . Squamous cell carcinoma 10/14   left side of nose  . Vitamin D deficiency 12/13/2011     Social History   Socioeconomic History  . Marital status: Widowed    Spouse name: Not on file  . Number of children: 2  . Years of education: Not on file  . Highest education level: Not on file  Occupational History    Employer: RETIRED    Social Needs  . Financial resource strain: Not on file  . Food insecurity:    Worry: Not on file    Inability: Not on file  . Transportation needs:    Medical: Not on file    Non-medical: Not on file  Tobacco Use  . Smoking status: Passive Smoke Exposure - Never Smoker  . Smokeless tobacco: Never Used  . Tobacco comment: Lived with smokers   Substance and Sexual Activity  . Alcohol use: Yes    Alcohol/week: 0.0 standard drinks    Comment: rare- 1 drink/ month  . Drug use: No  . Sexual activity: Never  Lifestyle  . Physical activity:    Days per week: Not on file    Minutes per session: Not on file  . Stress: Not on file  Relationships  . Social connections:    Talks on phone: Not on file    Gets together: Not on file    Attends religious service: Not on file    Active member of club or organization: Not on file    Attends meetings of clubs or organizations: Not on file    Relationship status: Not on file  . Intimate partner violence:    Fear of current or ex partner: Not on file    Emotionally abused: Not on file    Physically abused: Not on file    Forced sexual activity: Not  on file  Other Topics Concern  . Not on file  Social History Narrative   Regular Exercise- no    Past Surgical History:  Procedure Laterality Date  . ABDOMINAL HYSTERECTOMY  2007  . ARM SKIN LESION BIOPSY / EXCISION     pre-cancerous  . Bilateral mammoplasty    . LAMINECTOMY  1989 and 1990   L4, L5  . TONSILLECTOMY AND ADENOIDECTOMY  1941    Family History  Problem Relation Age of Onset  . Hypertension Mother   . COPD Father        smoker  . Cancer Father        lung  . Cancer Sister        breast  . Bell's palsy Sister   . Heart disease Sister        CHF  . Cancer Daughter        breast/ stage 1- s/p lumpectomy and radiation  . Cancer Other        Hodgkins Disease  . Cancer Daughter     Allergies  Allergen Reactions  . Fosamax [Alendronate Sodium] Other (See Comments)     Facial redness Facial redness    Current Outpatient Medications on File Prior to Visit  Medication Sig Dispense Refill  . Cyanocobalamin (VITAMIN B 12 PO) Take 1 lozenge by mouth daily. 1052mcg daily    . diltiazem (CARDIZEM) 120 MG tablet TAKE 1 TABLET (120 MG TOTAL) BY MOUTH DAILY. 90 tablet 2  . Multiple Vitamins-Minerals (ICAPS AREDS 2 PO) Take by mouth daily.    . Psyllium (METAMUCIL FIBER PO) Take daily as needed by mouth.     . desmopressin (DDAVP) 0.2 MG tablet Take 0.2 mg by mouth at bedtime.     No current facility-administered medications on file prior to visit.     BP (!) 120/55 (BP Location: Left Arm, Patient Position: Sitting, Cuff Size: Normal)   Pulse (!) 58   Temp 97.6 F (36.4 C) (Oral)   Ht 5\' 6"  (1.676 m)   Wt 162 lb 12.8 oz (73.8 kg)   SpO2 97%   BMI 26.28 kg/m       Objective:   Physical Exam  General Mental Status- Alert. General Appearance- Not in acute distress.   Skin General: Color- Normal Color. Moisture- Normal Moisture.  Neck Carotid Arteries- Normal color. Moisture- Normal Moisture. No carotid bruits. No JVD.  Chest and Lung Exam Auscultation: Breath Sounds:-Normal.  Cardiovascular Auscultation:Rythm- Regular. Murmurs & Other Heart Sounds:Auscultation of the heart reveals- No Murmurs.  Abdomen Inspection:-Inspeection Normal. Palpation/Percussion:Note:No mass. Palpation and Percussion of the abdomen reveal- Non Tender, Non Distended + BS, no rebound or guarding.   Neurologic Cranial Nerve exam:- CN III-XII intact(No nystagmus), symmetric smile. Strength:- 5/5 equal and symmetric strength both upper and lower extremities.  Back- no cva tenderness.  Rt upper ext- wound looks clean and not infected.  Lower ext- calfs symmetric. Negative homans signs.      Assessment & Plan:  For history of uti and history of frequent urination, we will run your UA and get a culture of your urine. Will see if you have persistent infection  as you report never clearing uti in the past. If persistent infection may need to reach out to your urologist or pcp as to continue treatment.  Also would ask you call you urologist if you can start desmopressin as ED had advised you to hold the med.  Follow up as regularly scheduled or as needed  Mackie Pai, PA-C

## 2018-02-04 LAB — URINE CULTURE
MICRO NUMBER:: 91016211
SPECIMEN QUALITY: ADEQUATE

## 2018-02-12 DIAGNOSIS — C44629 Squamous cell carcinoma of skin of left upper limb, including shoulder: Secondary | ICD-10-CM | POA: Diagnosis not present

## 2018-02-12 DIAGNOSIS — C44622 Squamous cell carcinoma of skin of right upper limb, including shoulder: Secondary | ICD-10-CM | POA: Diagnosis not present

## 2018-02-12 DIAGNOSIS — D0462 Carcinoma in situ of skin of left upper limb, including shoulder: Secondary | ICD-10-CM | POA: Diagnosis not present

## 2018-02-12 DIAGNOSIS — L570 Actinic keratosis: Secondary | ICD-10-CM | POA: Diagnosis not present

## 2018-02-12 MED FILL — dilTIAZem HCL 120 MG TABS: 120 | 90 days supply | Qty: 90 | Fill #2

## 2018-05-26 DIAGNOSIS — I480 Paroxysmal atrial fibrillation: Secondary | ICD-10-CM | POA: Diagnosis not present

## 2018-06-11 MED FILL — DESMOPRESSIN ACETATE 0.2 MG: 0.2 | 37 days supply | Qty: 60 | Fill #1

## 2018-07-10 DIAGNOSIS — K469 Unspecified abdominal hernia without obstruction or gangrene: Secondary | ICD-10-CM | POA: Diagnosis not present

## 2018-07-10 DIAGNOSIS — M79606 Pain in leg, unspecified: Secondary | ICD-10-CM | POA: Diagnosis not present

## 2018-07-10 DIAGNOSIS — H3530 Unspecified macular degeneration: Secondary | ICD-10-CM | POA: Diagnosis not present

## 2018-07-10 DIAGNOSIS — Z7689 Persons encountering health services in other specified circumstances: Secondary | ICD-10-CM | POA: Diagnosis not present

## 2018-07-10 DIAGNOSIS — Z23 Encounter for immunization: Secondary | ICD-10-CM | POA: Diagnosis not present

## 2018-07-10 DIAGNOSIS — Z0000 Encounter for general adult medical examination without abnormal findings: Secondary | ICD-10-CM | POA: Diagnosis not present

## 2018-07-10 DIAGNOSIS — R32 Unspecified urinary incontinence: Secondary | ICD-10-CM | POA: Diagnosis not present

## 2018-07-10 DIAGNOSIS — E663 Overweight: Secondary | ICD-10-CM | POA: Diagnosis not present

## 2018-07-10 DIAGNOSIS — K439 Ventral hernia without obstruction or gangrene: Secondary | ICD-10-CM | POA: Diagnosis not present

## 2018-07-15 DIAGNOSIS — J069 Acute upper respiratory infection, unspecified: Secondary | ICD-10-CM | POA: Diagnosis not present

## 2018-07-15 DIAGNOSIS — R05 Cough: Secondary | ICD-10-CM | POA: Diagnosis not present

## 2018-07-20 DIAGNOSIS — K4020 Bilateral inguinal hernia, without obstruction or gangrene, not specified as recurrent: Secondary | ICD-10-CM | POA: Diagnosis not present

## 2018-08-02 IMAGING — CT CT HEAD W/O CM
3 series · 14 of 46 positions shown, 16 images · non-contrast
Comparison: 09/17/2013

CLINICAL DATA: Dizziness with lightheadedness and hypotension. Near
syncope.

EXAM:
CT HEAD WITHOUT CONTRAST
TECHNIQUE: Contiguous axial images were obtained from the base of the skull
through the vertex without intravenous contrast.

[Series 2: head wo · axial · 0.44mm/px · z∈[+1104,+1224]mm · 8 of 29 slices shown, 10 images]
[im 3/29  brain]
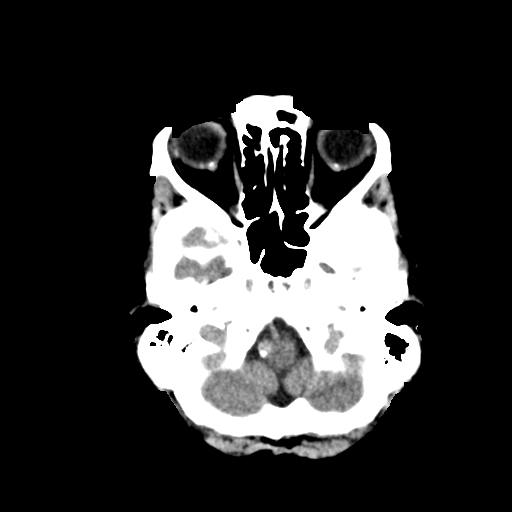
[im 3/29  bone]
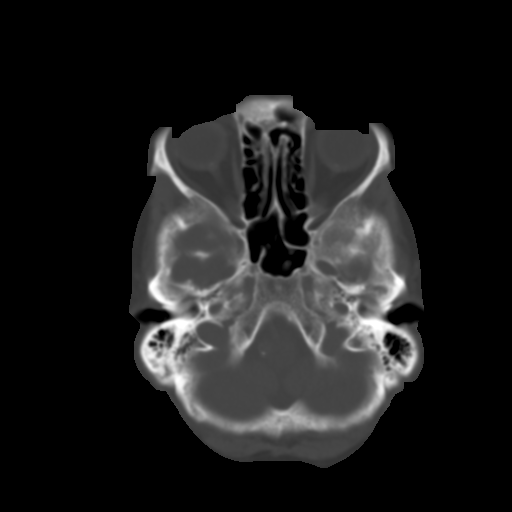
[im 7/29  brain]
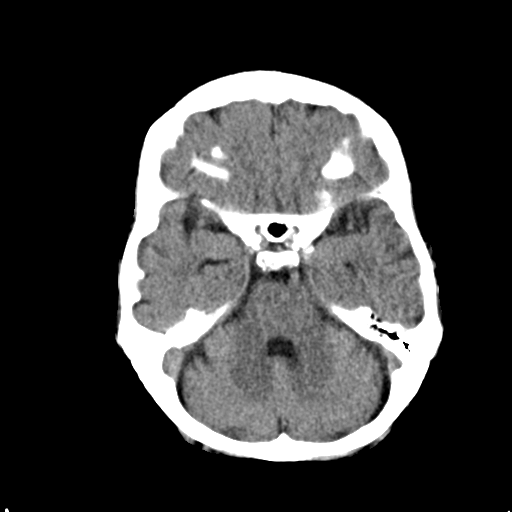
[im 10/29  brain]
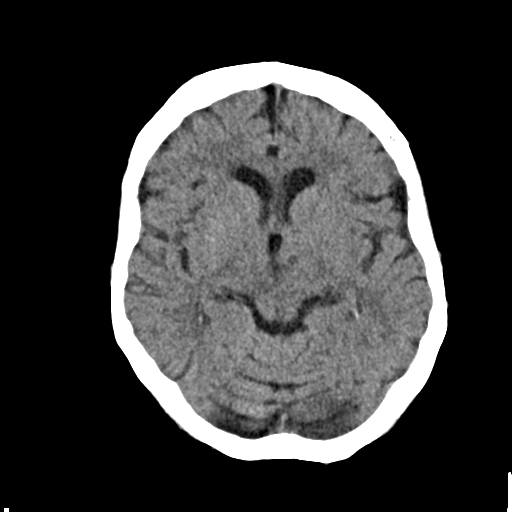
[im 13/29  brain]
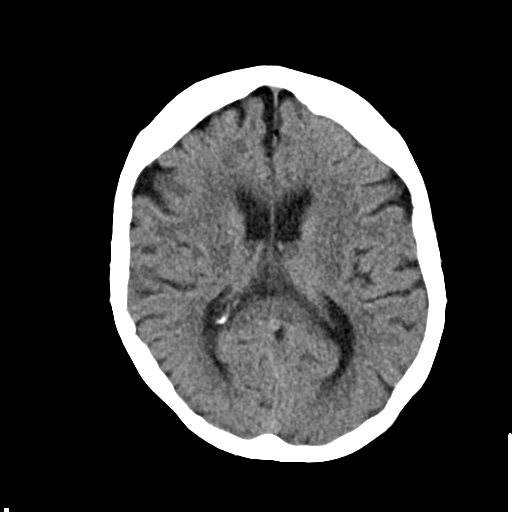
[im 17/29  brain]
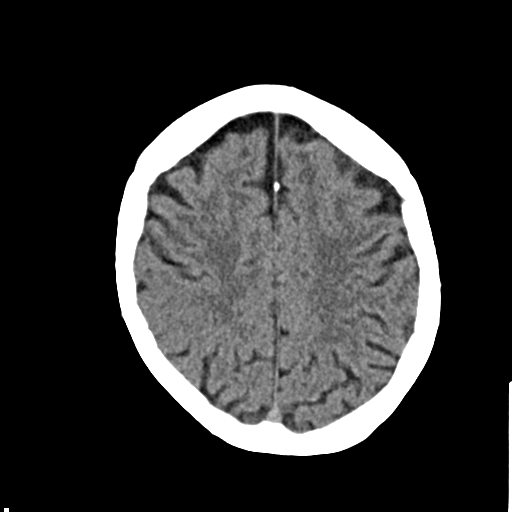
[im 17/29  bone]
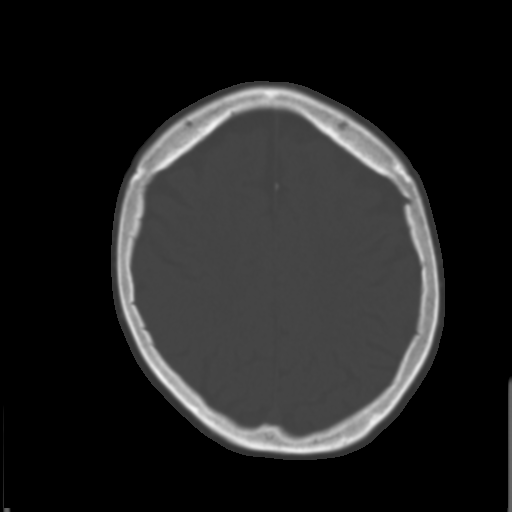
[im 20/29  brain]
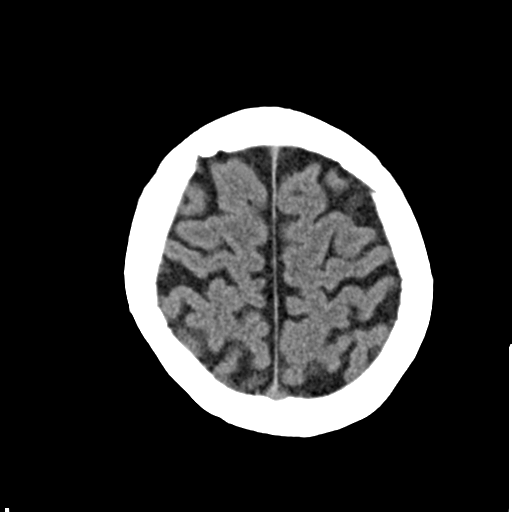
[im 23/29  brain]
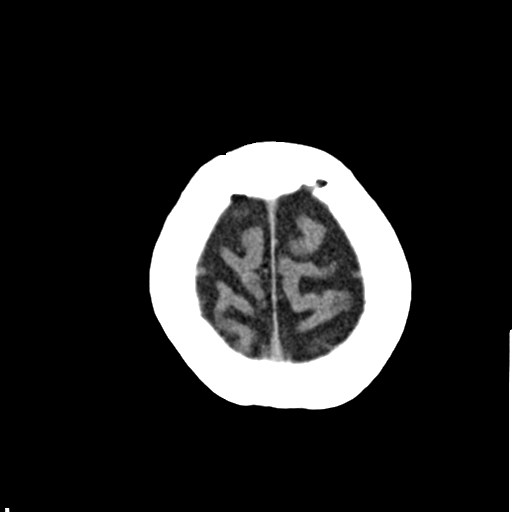
[im 27/29  brain]
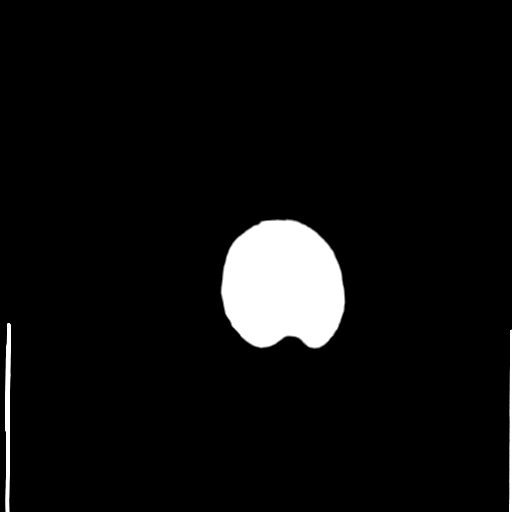

[Series 4: coronal soft · coronal · 0.29mm/px · 3 of 67 slices shown]
[im 23/67  brain]
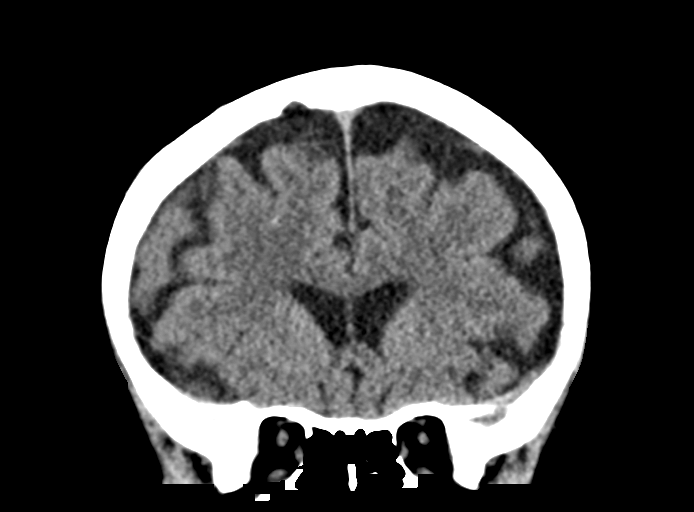
[im 30/67  brain]
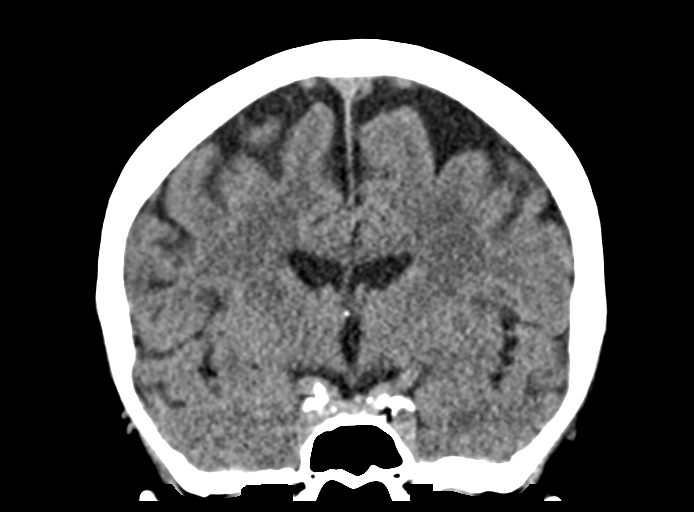
[im 37/67  brain]
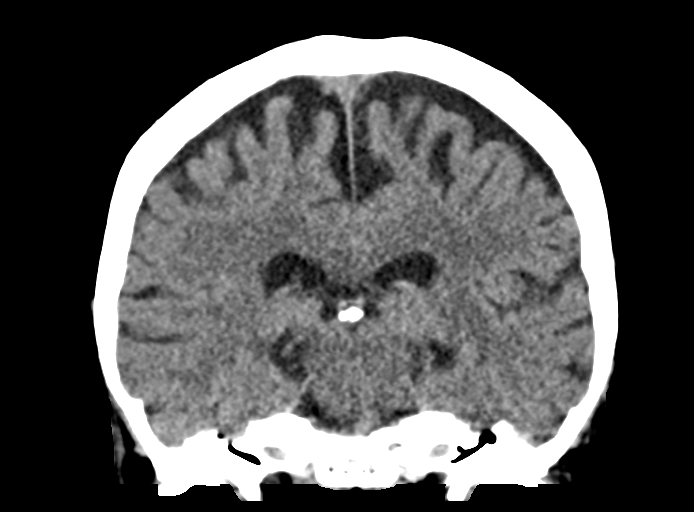

[Series 5: sag soft · sagittal · 0.28mm/px · 3 of 67 slices shown]
[im 23/67  brain]
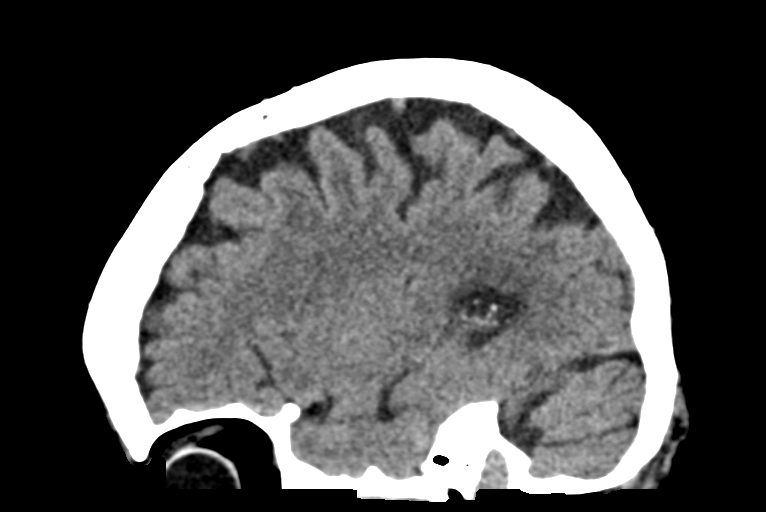
[im 34/67  brain]
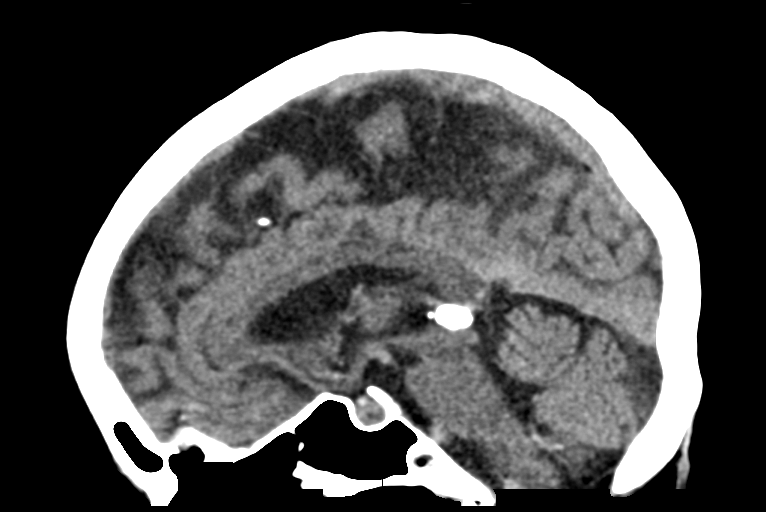
[im 45/67  brain]
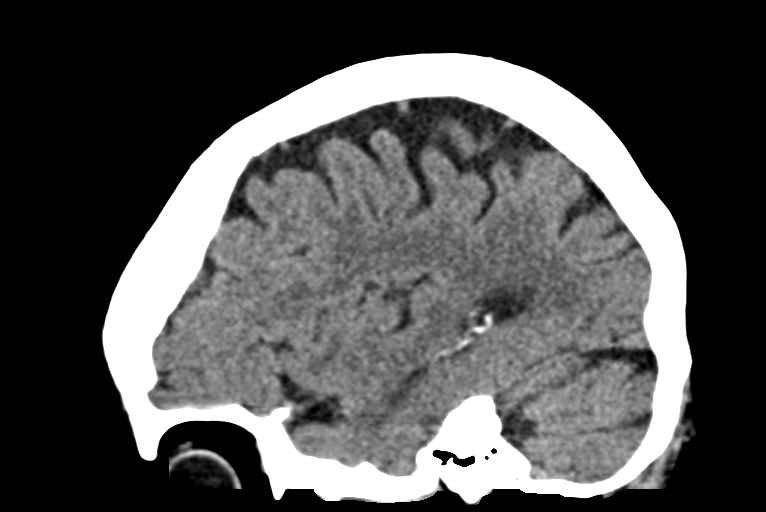

[14 of 46 positions shown; findings below may reference images not displayed]

FINDINGS: Brain: The ventricles, cisterns and other CSF spaces are within
normal. Minimal chronic ischemic microvascular disease. There is no
mass, mass effect, shift of midline structures or acute hemorrhage.
There is no evidence of acute infarction.

Vascular: Calcified plaque over the cavernous segment of the
internal carotid arteries.

Skull: Within normal.

Sinuses/Orbits: Hypoplastic frontal sinuses as the sinuses are
otherwise clear. Orbits within normal.
IMPRESSION: No acute intracranial findings.

Minimal chronic ischemic microvascular disease.

## 2018-08-17 DIAGNOSIS — R600 Localized edema: Secondary | ICD-10-CM | POA: Diagnosis not present

## 2018-08-17 DIAGNOSIS — M79605 Pain in left leg: Secondary | ICD-10-CM | POA: Diagnosis not present

## 2018-08-17 DIAGNOSIS — D492 Neoplasm of unspecified behavior of bone, soft tissue, and skin: Secondary | ICD-10-CM | POA: Diagnosis not present

## 2018-08-17 DIAGNOSIS — M79661 Pain in right lower leg: Secondary | ICD-10-CM | POA: Diagnosis not present

## 2018-08-17 DIAGNOSIS — M7121 Synovial cyst of popliteal space [Baker], right knee: Secondary | ICD-10-CM | POA: Diagnosis not present

## 2018-08-17 DIAGNOSIS — M7989 Other specified soft tissue disorders: Secondary | ICD-10-CM | POA: Diagnosis not present

## 2018-08-17 DIAGNOSIS — M25561 Pain in right knee: Secondary | ICD-10-CM | POA: Diagnosis not present

## 2018-08-19 DIAGNOSIS — M85851 Other specified disorders of bone density and structure, right thigh: Secondary | ICD-10-CM | POA: Diagnosis not present

## 2018-08-19 DIAGNOSIS — Z13820 Encounter for screening for osteoporosis: Secondary | ICD-10-CM | POA: Diagnosis not present

## 2018-08-19 DIAGNOSIS — M8589 Other specified disorders of bone density and structure, multiple sites: Secondary | ICD-10-CM | POA: Diagnosis not present

## 2018-08-19 DIAGNOSIS — M85852 Other specified disorders of bone density and structure, left thigh: Secondary | ICD-10-CM | POA: Diagnosis not present

## 2018-08-19 DIAGNOSIS — M8588 Other specified disorders of bone density and structure, other site: Secondary | ICD-10-CM | POA: Diagnosis not present

## 2018-08-24 DIAGNOSIS — M1711 Unilateral primary osteoarthritis, right knee: Secondary | ICD-10-CM | POA: Diagnosis not present

## 2018-08-24 DIAGNOSIS — R2242 Localized swelling, mass and lump, left lower limb: Secondary | ICD-10-CM | POA: Diagnosis not present

## 2018-08-24 DIAGNOSIS — M7121 Synovial cyst of popliteal space [Baker], right knee: Secondary | ICD-10-CM | POA: Diagnosis not present

## 2018-09-16 DIAGNOSIS — Z0000 Encounter for general adult medical examination without abnormal findings: Secondary | ICD-10-CM | POA: Diagnosis not present

## 2018-10-16 DIAGNOSIS — R109 Unspecified abdominal pain: Secondary | ICD-10-CM | POA: Diagnosis not present

## 2018-10-19 DIAGNOSIS — K4020 Bilateral inguinal hernia, without obstruction or gangrene, not specified as recurrent: Secondary | ICD-10-CM | POA: Diagnosis not present

## 2018-12-07 DIAGNOSIS — H353132 Nonexudative age-related macular degeneration, bilateral, intermediate dry stage: Secondary | ICD-10-CM | POA: Diagnosis not present

## 2023-07-03 ENCOUNTER — Other Ambulatory Visit (HOSPITAL_BASED_OUTPATIENT_CLINIC_OR_DEPARTMENT_OTHER): Payer: Self-pay

## 2023-07-03 MED ORDER — GEMTESA 75 MG PO TABS
75.0000 mg | ORAL_TABLET | Freq: Every day | ORAL | 6 refills | Status: AC
  Filled 2023-07-03: qty 30, 30d supply, fill #0
